# Patient Record
Sex: Male | Born: 1940 | Race: White | Hispanic: No | Marital: Married | Smoking: Former smoker
Health system: Southern US, Community
[De-identification: ages and names within clinical notes are randomized; demographics above are authoritative.]

## PROBLEM LIST (undated history)

## (undated) DIAGNOSIS — R079 Chest pain, unspecified: Secondary | ICD-10-CM

## (undated) DIAGNOSIS — I2510 Atherosclerotic heart disease of native coronary artery without angina pectoris: Secondary | ICD-10-CM

## (undated) DIAGNOSIS — R0989 Other specified symptoms and signs involving the circulatory and respiratory systems: Secondary | ICD-10-CM

## (undated) DIAGNOSIS — I219 Acute myocardial infarction, unspecified: Secondary | ICD-10-CM

## (undated) DIAGNOSIS — E65 Localized adiposity: Secondary | ICD-10-CM

## (undated) DIAGNOSIS — E559 Vitamin D deficiency, unspecified: Secondary | ICD-10-CM

## (undated) DIAGNOSIS — E785 Hyperlipidemia, unspecified: Secondary | ICD-10-CM

## (undated) DIAGNOSIS — H9190 Unspecified hearing loss, unspecified ear: Secondary | ICD-10-CM

## (undated) DIAGNOSIS — R0600 Dyspnea, unspecified: Secondary | ICD-10-CM

## (undated) DIAGNOSIS — F1010 Alcohol abuse, uncomplicated: Secondary | ICD-10-CM

## (undated) DIAGNOSIS — I6529 Occlusion and stenosis of unspecified carotid artery: Secondary | ICD-10-CM

## (undated) DIAGNOSIS — H61002 Unspecified perichondritis of left external ear: Secondary | ICD-10-CM

## (undated) DIAGNOSIS — I4891 Unspecified atrial fibrillation: Secondary | ICD-10-CM

## (undated) DIAGNOSIS — E079 Disorder of thyroid, unspecified: Secondary | ICD-10-CM

## (undated) DIAGNOSIS — K219 Gastro-esophageal reflux disease without esophagitis: Secondary | ICD-10-CM

## (undated) DIAGNOSIS — M1990 Unspecified osteoarthritis, unspecified site: Secondary | ICD-10-CM

## (undated) DIAGNOSIS — I10 Essential (primary) hypertension: Secondary | ICD-10-CM

## (undated) HISTORY — DX: Occlusion and stenosis of unspecified carotid artery: I65.29

## (undated) HISTORY — DX: Alcohol abuse, uncomplicated: F10.10

## (undated) HISTORY — DX: Unspecified perichondritis of left external ear: H61.002

## (undated) HISTORY — DX: Disorder of thyroid, unspecified: E07.9

## (undated) HISTORY — DX: Localized adiposity: E65

## (undated) HISTORY — DX: Vitamin D deficiency, unspecified: E55.9

## (undated) HISTORY — DX: Hyperlipidemia, unspecified: E78.5

## (undated) HISTORY — DX: Essential (primary) hypertension: I10

## (undated) HISTORY — DX: Chest pain, unspecified: R07.9

## (undated) HISTORY — DX: Other specified symptoms and signs involving the circulatory and respiratory systems: R09.89

## (undated) HISTORY — PX: CORONARY ARTERY BYPASS GRAFT: SHX141

---

## 1989-11-13 DIAGNOSIS — I219 Acute myocardial infarction, unspecified: Secondary | ICD-10-CM

## 1989-11-13 HISTORY — DX: Acute myocardial infarction, unspecified: I21.9

## 2002-11-13 HISTORY — PX: CAROTID STENT: SHX1301

## 2004-03-01 ENCOUNTER — Ambulatory Visit (HOSPITAL_COMMUNITY): Admission: RE | Admit: 2004-03-01 | Discharge: 2004-03-02 | Payer: Self-pay | Admitting: Cardiovascular Disease

## 2014-04-29 DIAGNOSIS — N138 Other obstructive and reflux uropathy: Secondary | ICD-10-CM | POA: Insufficient documentation

## 2014-04-29 DIAGNOSIS — R972 Elevated prostate specific antigen [PSA]: Secondary | ICD-10-CM | POA: Insufficient documentation

## 2014-04-29 DIAGNOSIS — N401 Enlarged prostate with lower urinary tract symptoms: Secondary | ICD-10-CM | POA: Insufficient documentation

## 2014-07-01 DIAGNOSIS — H9190 Unspecified hearing loss, unspecified ear: Secondary | ICD-10-CM | POA: Insufficient documentation

## 2014-07-01 DIAGNOSIS — J309 Allergic rhinitis, unspecified: Secondary | ICD-10-CM | POA: Insufficient documentation

## 2014-07-01 DIAGNOSIS — H9313 Tinnitus, bilateral: Secondary | ICD-10-CM | POA: Insufficient documentation

## 2014-07-01 DIAGNOSIS — I219 Acute myocardial infarction, unspecified: Secondary | ICD-10-CM | POA: Insufficient documentation

## 2014-07-01 DIAGNOSIS — K219 Gastro-esophageal reflux disease without esophagitis: Secondary | ICD-10-CM | POA: Insufficient documentation

## 2014-07-01 DIAGNOSIS — N529 Male erectile dysfunction, unspecified: Secondary | ICD-10-CM | POA: Insufficient documentation

## 2014-07-01 DIAGNOSIS — N400 Enlarged prostate without lower urinary tract symptoms: Secondary | ICD-10-CM | POA: Insufficient documentation

## 2014-07-01 DIAGNOSIS — E785 Hyperlipidemia, unspecified: Secondary | ICD-10-CM | POA: Insufficient documentation

## 2014-07-01 DIAGNOSIS — I2510 Atherosclerotic heart disease of native coronary artery without angina pectoris: Secondary | ICD-10-CM | POA: Insufficient documentation

## 2014-07-01 DIAGNOSIS — H903 Sensorineural hearing loss, bilateral: Secondary | ICD-10-CM | POA: Insufficient documentation

## 2014-07-01 DIAGNOSIS — H8110 Benign paroxysmal vertigo, unspecified ear: Secondary | ICD-10-CM | POA: Insufficient documentation

## 2014-07-01 DIAGNOSIS — E7800 Pure hypercholesterolemia, unspecified: Secondary | ICD-10-CM | POA: Insufficient documentation

## 2016-08-18 DIAGNOSIS — Z8619 Personal history of other infectious and parasitic diseases: Secondary | ICD-10-CM | POA: Insufficient documentation

## 2016-08-18 DIAGNOSIS — Z8719 Personal history of other diseases of the digestive system: Secondary | ICD-10-CM | POA: Insufficient documentation

## 2017-04-23 ENCOUNTER — Telehealth: Payer: Self-pay | Admitting: *Deleted

## 2017-04-23 NOTE — Telephone Encounter (Signed)
NOTES SENT TO SCHEDULING.  °

## 2017-04-25 ENCOUNTER — Telehealth: Payer: Self-pay | Admitting: Cardiovascular Disease

## 2017-04-25 NOTE — Telephone Encounter (Signed)
Received records from [facility] for appointment on 05/02/17 with Dr Allyson Sabal.  Records put with Dr Hazle Coca schedule for 04/2017. lp

## 2017-04-30 DIAGNOSIS — K219 Gastro-esophageal reflux disease without esophagitis: Secondary | ICD-10-CM | POA: Insufficient documentation

## 2017-05-02 ENCOUNTER — Ambulatory Visit (INDEPENDENT_AMBULATORY_CARE_PROVIDER_SITE_OTHER): Payer: Medicare Other | Admitting: Cardiovascular Disease

## 2017-05-02 ENCOUNTER — Encounter: Payer: Self-pay | Admitting: Cardiovascular Disease

## 2017-05-02 VITALS — BP 140/72 | HR 58 | Ht 67.0 in | Wt 214.2 lb

## 2017-05-02 DIAGNOSIS — R079 Chest pain, unspecified: Secondary | ICD-10-CM | POA: Diagnosis not present

## 2017-05-02 DIAGNOSIS — I10 Essential (primary) hypertension: Secondary | ICD-10-CM

## 2017-05-02 DIAGNOSIS — E7800 Pure hypercholesterolemia, unspecified: Secondary | ICD-10-CM | POA: Diagnosis not present

## 2017-05-02 NOTE — Assessment & Plan Note (Signed)
History of coronary artery disease status post one-vessel coronary artery bypass grafting with a RIMA to the RCA by Dr. Arvilla Market in 1991 at [facility]. I placed a 2.75 mm x 16 mm long Taxus drug eluding stent to his proximal LAD 02/20/04. He's had a recent 2-D echo was normal and a Myoview stress test performed 02/03/16 was normal. He's had progressive substernal chest pain or dyspnea was referred by his primary care physician for diagnostic coronary angiography.The patient understands that risks included but are not limited to stroke (1 in 1000), death (1 in 1000), kidney failure [usually temporary] (1 in 500), bleeding (1 in 200), allergic reaction [possibly serious] (1 in 200). The patient understands and agrees to proceed

## 2017-05-02 NOTE — Patient Instructions (Signed)
   [facility] CARDIOVASCULAR DIVISION [facility] [address] [place] [address] Dept: [phone] Loc: [phone]  Kevin Gonzales  05/02/2017  You are scheduled for a Cardiac Catheterization on Thursday, June 28 with Dr. Nanetta Batty.  1. Please arrive at the [facility] (Main Entrance A) at [facility]: [address] [place], [place] [address] at 9:30 AM (two hours before your procedure to ensure your preparation). Free valet parking service is available.   Special note: Every effort is made to have your procedure done on time. Please understand that emergencies sometimes delay scheduled procedures.  2. Diet: Do not eat or drink anything after midnight prior to your procedure except sips of water to take medications.  3. Labs: You will need to have blood drawn on Wednesday, June 20 in out office. 4. Medication instructions in preparation for your procedure:  On the morning of your procedure, take your Aspirin and any morning medicines NOT listed above.  You may use sips of water.  5. Plan for one night stay--bring personal belongings. 6. Bring a current list of your medications and current insurance cards. 7. You MUST have a responsible person to drive you home. 8. Someone MUST be with you the first 24 hours after you arrive home or your discharge will be delayed. 9. Please wear clothes that are easy to get on and off and wear slip-on shoes.  Thank you for allowing [place] to care for you!   -- [facility]

## 2017-05-02 NOTE — Assessment & Plan Note (Signed)
History of essential hypertension blood pressure measured today at 140/72. He is on losartan. Continue current meds at current dosing.

## 2017-05-02 NOTE — Assessment & Plan Note (Signed)
History of hyperlipidemia not on statin therapy. 

## 2017-05-02 NOTE — Progress Notes (Signed)
05/02/2017 Kevin Gonzales   03-25-1941  [id number]  Primary Physician No primary care provider on file. Primary Cardiologist: Kevin Gess MD Kevin Gonzales  HPI:  Kevin Gonzales is a 76 year old mildly overweight married Caucasian male father of 2, grandfather one grandchild referred by Kevin Gonzales for cardiovascular evaluation and coronary angiography because of ongoing chest pain. He has a history of treated hypertension, hyperlipidemia and remote tobacco abuse having quit in 1991. He had coronary artery bypass grafting times one in 1991 with a RIMA to his RCA by Kevin Gonzales at [facility]. I placed a 2.75 mm x 16 mm long Taxus drug-eluting stent in his proximal LAD 02/20/04. Over the last one to 2 years she's had progressive shortness of breath and chest pain. Did have an normal 2-D echo 11/17/16 and a nonischemic Myoview 02/03/16.   Current Outpatient Prescriptions  Medication Sig Dispense Refill  . aspirin EC 81 MG tablet Take 81 mg by mouth daily.    . beclomethasone (QVAR) 80 MCG/ACT inhaler Inhale 1 puff into the lungs 2 (two) times daily.    . finasteride (PROSCAR) 5 MG tablet Take 5 mg by mouth daily.    Marland Kitchen losartan (COZAAR) 50 MG tablet Take 50 mg by mouth daily.    . meclizine (ANTIVERT) 25 MG tablet Take 25 mg by mouth 3 (three) times daily as needed for dizziness.    . nitroGLYCERIN (NITROSTAT) 0.4 MG SL tablet Place 0.4 mg under the tongue every 5 (five) minutes as needed for chest pain.    . Omega-3 Fatty Acids (FISH OIL PO) Take 3 capsules by mouth daily.    Marland Kitchen omeprazole (PRILOSEC) 20 MG capsule Take 20 mg by mouth daily.    . Vitamin D, Ergocalciferol, (DRISDOL) 50000 units CAPS capsule Take 50,000 Units by mouth every 7 (seven) days.     No current facility-administered medications for this visit.     No Known Allergies  Social History   Social History  . Marital status: Married    Spouse name: N/A  . Number of children: N/A  .  Years of education: N/A   Occupational History  . Not on file.   Social History Main Topics  . Smoking status: Not on file  . Smokeless tobacco: Not on file  . Alcohol use Not on file  . Drug use: No  . Sexual activity: Not on file   Other Topics Concern  . Not on file   Social History Narrative  . No narrative on file     Review of Systems: General: negative for chills, fever, night sweats or weight changes.  Cardiovascular: negative for chest pain, dyspnea on exertion, edema, orthopnea, palpitations, paroxysmal nocturnal dyspnea or shortness of breath Dermatological: negative for rash Respiratory: negative for cough or wheezing Urologic: negative for hematuria Abdominal: negative for nausea, vomiting, diarrhea, bright red blood per rectum, melena, or hematemesis Neurologic: negative for visual changes, syncope, or dizziness All other systems reviewed and are otherwise negative except as noted above.    Blood pressure 140/72, pulse (!) 58, height 5\' 7"  (1.702 m), weight 214 lb 3.2 oz (97.2 kg).  General appearance: alert and no distress Neck: no adenopathy, no JVD, supple, symmetrical, trachea midline, thyroid not enlarged, symmetric, no tenderness/mass/nodules and Right carotid bruit Lungs: clear to auscultation bilaterally Heart: regular rate and rhythm, S1, S2 normal, no murmur, click, rub or gallop Extremities: extremities normal, atraumatic, no cyanosis or edema  EKG  sinus bradycardia 58 without ST or T-wave changes. I personally reviewed this EKG  ASSESSMENT AND PLAN:   Coronary artery disease History of coronary artery disease status post one-vessel coronary artery bypass grafting with a RIMA to the RCA by Kevin Gonzales in 1991 at [facility]. I placed a 2.75 mm x 16 mm long Taxus drug eluding stent to his proximal LAD 02/20/04. He's had a recent 2-D echo was normal and a Myoview stress test performed 02/03/16 was normal. He's had progressive  substernal chest pain or dyspnea was referred by his primary care physician for diagnostic coronary angiography.The patient understands that risks included but are not limited to stroke (1 in 1000), death (1 in 1000), kidney failure [usually temporary] (1 in 500), bleeding (1 in 200), allergic reaction [possibly serious] (1 in 200). The patient understands and agrees to proceed  Hypercholesterolemia History of hyperlipidemia not on statin therapy  Essential hypertension History of essential hypertension blood pressure measured today at 140/72. He is on losartan. Continue current meds at current dosing.      Kevin Gess MD FACP,FACC,FAHA, [facility] 05/02/2017 2:30 PM

## 2017-05-03 LAB — CBC WITH DIFFERENTIAL/PLATELET
BASOS ABS: 0.1 10*3/uL (ref 0.0–0.2)
Basos: 1 %
EOS (ABSOLUTE): 0.2 10*3/uL (ref 0.0–0.4)
Eos: 3 %
HEMOGLOBIN: 14.4 g/dL (ref 13.0–17.7)
Hematocrit: 42.5 % (ref 37.5–51.0)
IMMATURE GRANS (ABS): 0 10*3/uL (ref 0.0–0.1)
Immature Granulocytes: 0 %
LYMPHS: 25 %
Lymphocytes Absolute: 1.7 10*3/uL (ref 0.7–3.1)
MCH: 31 pg (ref 26.6–33.0)
MCHC: 33.9 g/dL (ref 31.5–35.7)
MCV: 92 fL (ref 79–97)
MONOCYTES: 7 %
Monocytes Absolute: 0.5 10*3/uL (ref 0.1–0.9)
NEUTROS ABS: 4.2 10*3/uL (ref 1.4–7.0)
Neutrophils: 64 %
Platelets: 246 10*3/uL (ref 150–379)
RBC: 4.64 x10E6/uL (ref 4.14–5.80)
RDW: 13 % (ref 12.3–15.4)
WBC: 6.6 10*3/uL (ref 3.4–10.8)

## 2017-05-03 LAB — APTT: APTT: 25 s (ref 24–33)

## 2017-05-03 LAB — BASIC METABOLIC PANEL
BUN/Creatinine Ratio: 20 (ref 10–24)
BUN: 16 mg/dL (ref 8–27)
CALCIUM: 9.3 mg/dL (ref 8.6–10.2)
CHLORIDE: 101 mmol/L (ref 96–106)
CO2: 25 mmol/L (ref 20–29)
Creatinine, Ser: 0.79 mg/dL (ref 0.76–1.27)
GFR, EST AFRICAN AMERICAN: 102 mL/min/{1.73_m2} (ref >59)
GFR, EST NON AFRICAN AMERICAN: 88 mL/min/{1.73_m2} (ref >59)
Glucose: 102 mg/dL — ABNORMAL HIGH (ref 65–99)
Potassium: 4.3 mmol/L (ref 3.5–5.2)
Sodium: 140 mmol/L (ref 134–144)

## 2017-05-03 LAB — PROTIME-INR
INR: 0.9 (ref 0.8–1.2)
PROTHROMBIN TIME: 10.1 s (ref 9.1–12.0)

## 2017-05-03 LAB — TSH: TSH: 2.86 u[IU]/mL (ref 0.450–4.500)

## 2017-05-08 ENCOUNTER — Telehealth: Payer: Self-pay

## 2017-05-08 NOTE — Telephone Encounter (Signed)
Patient contacted pre-catheterization at [facility] scheduled for: 05/10/2017 @ 1330 Verified arrival time and place: NT at 1130-proceed to admitting Confirmed AM meds to be taken pre-cath with sip of water:  Notified Pt to take baby ASA prior to arrival.  Pt states he will take all his meds.  Notified Pt that was fine. Confirmed patient has responsible person to drive home post procedure and observe patient for 24 hours:  Pt states yes Addl concerns:  None noted

## 2017-05-09 ENCOUNTER — Other Ambulatory Visit: Payer: Self-pay | Admitting: Cardiovascular Disease

## 2017-05-09 DIAGNOSIS — R079 Chest pain, unspecified: Secondary | ICD-10-CM

## 2017-05-10 ENCOUNTER — Ambulatory Visit (HOSPITAL_COMMUNITY)
Admission: RE | Admit: 2017-05-10 | Discharge: 2017-05-10 | Disposition: A | Discharge status: Home / Self Care | Payer: Medicare Other | Source: Ambulatory Visit | Attending: Cardiovascular Disease | Admitting: Cardiovascular Disease

## 2017-05-10 ENCOUNTER — Encounter (HOSPITAL_COMMUNITY)
Admission: RE | Disposition: A | Discharge status: Home / Self Care | Payer: Self-pay | Source: Ambulatory Visit | Attending: Cardiovascular Disease

## 2017-05-10 DIAGNOSIS — Z955 Presence of coronary angioplasty implant and graft: Secondary | ICD-10-CM | POA: Insufficient documentation

## 2017-05-10 DIAGNOSIS — Z7982 Long term (current) use of aspirin: Secondary | ICD-10-CM | POA: Insufficient documentation

## 2017-05-10 DIAGNOSIS — I2583 Coronary atherosclerosis due to lipid rich plaque: Secondary | ICD-10-CM | POA: Insufficient documentation

## 2017-05-10 DIAGNOSIS — I2510 Atherosclerotic heart disease of native coronary artery without angina pectoris: Secondary | ICD-10-CM | POA: Diagnosis present

## 2017-05-10 DIAGNOSIS — I25118 Atherosclerotic heart disease of native coronary artery with other forms of angina pectoris: Secondary | ICD-10-CM | POA: Insufficient documentation

## 2017-05-10 DIAGNOSIS — I2582 Chronic total occlusion of coronary artery: Secondary | ICD-10-CM | POA: Insufficient documentation

## 2017-05-10 DIAGNOSIS — Z87891 Personal history of nicotine dependence: Secondary | ICD-10-CM | POA: Insufficient documentation

## 2017-05-10 DIAGNOSIS — E7800 Pure hypercholesterolemia, unspecified: Secondary | ICD-10-CM | POA: Diagnosis not present

## 2017-05-10 DIAGNOSIS — Z951 Presence of aortocoronary bypass graft: Secondary | ICD-10-CM | POA: Insufficient documentation

## 2017-05-10 DIAGNOSIS — I10 Essential (primary) hypertension: Secondary | ICD-10-CM | POA: Insufficient documentation

## 2017-05-10 DIAGNOSIS — R079 Chest pain, unspecified: Secondary | ICD-10-CM

## 2017-05-10 HISTORY — PX: LEFT HEART CATH AND CORS/GRAFTS ANGIOGRAPHY: CATH118250

## 2017-05-10 SURGERY — LEFT HEART CATH AND CORS/GRAFTS ANGIOGRAPHY
Anesthesia: LOCAL

## 2017-05-10 MED ORDER — ONDANSETRON HCL 4 MG/2ML IJ SOLN: 4.0000 mg | Freq: Four times a day (QID) | INTRAMUSCULAR | Status: DC | PRN

## 2017-05-10 MED ORDER — LIDOCAINE HCL (PF) 1 % IJ SOLN
INTRAMUSCULAR | Status: DC | PRN
  Administered 2017-05-10: 15 mL via INTRADERMAL

## 2017-05-10 MED ORDER — SODIUM CHLORIDE 0.9 % IV SOLN: 250.0000 mL | INTRAVENOUS | Status: DC | PRN

## 2017-05-10 MED ORDER — SODIUM CHLORIDE 0.9 % IV SOLN
250.0000 mL | INTRAVENOUS | Status: DC | PRN
Start: 2017-05-10 — End: 2017-05-10

## 2017-05-10 MED ORDER — ASPIRIN 81 MG PO CHEW: 81.0000 mg | CHEWABLE_TABLET | ORAL | Status: DC

## 2017-05-10 MED ORDER — SODIUM CHLORIDE 0.9% FLUSH: 3.0000 mL | INTRAVENOUS | Status: DC | PRN

## 2017-05-10 MED ORDER — SODIUM CHLORIDE 0.9% FLUSH: 3.0000 mL | Freq: Two times a day (BID) | INTRAVENOUS | Status: DC

## 2017-05-10 MED ORDER — MIDAZOLAM HCL 2 MG/2ML IJ SOLN
INTRAMUSCULAR | Status: DC | PRN
  Administered 2017-05-10: 1 mg via INTRAVENOUS

## 2017-05-10 MED ORDER — IOPAMIDOL (ISOVUE-370) INJECTION 76%
INTRAVENOUS | Status: AC
  Filled 2017-05-10: qty 50

## 2017-05-10 MED ORDER — ACETAMINOPHEN 325 MG PO TABS: 650.0000 mg | ORAL_TABLET | ORAL | Status: DC | PRN

## 2017-05-10 MED ORDER — FENTANYL CITRATE (PF) 100 MCG/2ML IJ SOLN
INTRAMUSCULAR | Status: DC | PRN
  Administered 2017-05-10: 25 ug via INTRAVENOUS

## 2017-05-10 MED ORDER — SODIUM CHLORIDE 0.9 % WEIGHT BASED INFUSION: 1.0000 mL/kg/h | INTRAVENOUS | Status: DC

## 2017-05-10 MED ORDER — MIDAZOLAM HCL 2 MG/2ML IJ SOLN
INTRAMUSCULAR | Status: AC
  Filled 2017-05-10: qty 2

## 2017-05-10 MED ORDER — SODIUM CHLORIDE 0.9 % IV SOLN: INTRAVENOUS | Status: AC

## 2017-05-10 MED ORDER — HYDRALAZINE HCL 20 MG/ML IJ SOLN: 10.0000 mg | INTRAMUSCULAR | Status: DC | PRN

## 2017-05-10 MED ORDER — HEPARIN (PORCINE) IN NACL 2-0.9 UNIT/ML-% IJ SOLN
INTRAMUSCULAR | Status: AC | PRN
Start: 2017-05-10 — End: 2017-05-10
  Administered 2017-05-10: 1000 mL

## 2017-05-10 MED ORDER — MORPHINE SULFATE (PF) 4 MG/ML IV SOLN: 2.0000 mg | INTRAVENOUS | Status: DC | PRN

## 2017-05-10 MED ORDER — FENTANYL CITRATE (PF) 100 MCG/2ML IJ SOLN
INTRAMUSCULAR | Status: AC
  Filled 2017-05-10: qty 2

## 2017-05-10 MED ORDER — ASPIRIN 81 MG PO CHEW: 81.0000 mg | CHEWABLE_TABLET | Freq: Every day | ORAL | Status: DC

## 2017-05-10 MED ORDER — LIDOCAINE HCL (PF) 1 % IJ SOLN
INTRAMUSCULAR | Status: AC
  Filled 2017-05-10: qty 30

## 2017-05-10 MED ORDER — IOPAMIDOL (ISOVUE-370) INJECTION 76%
INTRAVENOUS | Status: DC | PRN
  Administered 2017-05-10: 125 mL via INTRAVENOUS

## 2017-05-10 MED ORDER — IOPAMIDOL (ISOVUE-370) INJECTION 76%
INTRAVENOUS | Status: AC
  Filled 2017-05-10: qty 125

## 2017-05-10 MED ORDER — HEPARIN (PORCINE) IN NACL 2-0.9 UNIT/ML-% IJ SOLN
INTRAMUSCULAR | Status: AC
  Filled 2017-05-10: qty 1000

## 2017-05-10 MED ORDER — SODIUM CHLORIDE 0.9 % WEIGHT BASED INFUSION
3.0000 mL/kg/h | INTRAVENOUS | Status: AC
  Administered 2017-05-10: 3 mL/kg/h via INTRAVENOUS

## 2017-05-10 MED ORDER — SODIUM CHLORIDE 0.9 % WEIGHT BASED INFUSION: 3.0000 mL/kg/h | INTRAVENOUS | Status: DC

## 2017-05-10 SURGICAL SUPPLY — 10 items
CATH INFINITI 5FR JL5 (CATHETERS) ×2 IMPLANT
CATH INFINITI 5FR MULTPACK ANG (CATHETERS) ×2 IMPLANT
GUIDEWIRE 3MM J TIP .035 145 (WIRE) ×2 IMPLANT
KIT HEART LEFT (KITS) ×2 IMPLANT
PACK CARDIAC CATHETERIZATION (CUSTOM PROCEDURE TRAY) ×2 IMPLANT
SHEATH PINNACLE 5F 10CM (SHEATH) ×2 IMPLANT
SYR MEDRAD MARK V 150ML (SYRINGE) ×2 IMPLANT
TRANSDUCER W/STOPCOCK (MISCELLANEOUS) ×2 IMPLANT
TUBING CIL FLEX 10 FLL-RA (TUBING) ×2 IMPLANT
WIRE HI TORQ VERSACORE J 260CM (WIRE) ×2 IMPLANT

## 2017-05-10 NOTE — H&P (View-Only) (Signed)
05/02/2017 Kevin Gonzales   03-25-1941  [id number]  Primary Physician No primary care provider on file. Primary Cardiologist: Kevin Gess MD Kevin Gonzales  HPI:  Kevin Gonzales is a 76 year old mildly overweight married Caucasian male father of 2, grandfather one grandchild referred by Kevin Gonzales for cardiovascular evaluation and coronary angiography because of ongoing chest pain. He has a history of treated hypertension, hyperlipidemia and remote tobacco abuse having quit in 1991. He had coronary artery bypass grafting times one in 1991 with a RIMA to his RCA by Dr. Arvilla Gonzales at [facility]. I placed a 2.75 mm x 16 mm long Taxus drug-eluting stent in his proximal LAD 02/20/04. Over the last one to 2 years she's had progressive shortness of breath and chest pain. Did have an normal 2-D echo 11/17/16 and a nonischemic Myoview 02/03/16.   Current Outpatient Prescriptions  Medication Sig Dispense Refill  . aspirin EC 81 MG tablet Take 81 mg by mouth daily.    . beclomethasone (QVAR) 80 MCG/ACT inhaler Inhale 1 puff into the lungs 2 (two) times daily.    . finasteride (PROSCAR) 5 MG tablet Take 5 mg by mouth daily.    Marland Kitchen losartan (COZAAR) 50 MG tablet Take 50 mg by mouth daily.    . meclizine (ANTIVERT) 25 MG tablet Take 25 mg by mouth 3 (three) times daily as needed for dizziness.    . nitroGLYCERIN (NITROSTAT) 0.4 MG SL tablet Place 0.4 mg under the tongue every 5 (five) minutes as needed for chest pain.    . Omega-3 Fatty Acids (FISH OIL PO) Take 3 capsules by mouth daily.    Marland Kitchen omeprazole (PRILOSEC) 20 MG capsule Take 20 mg by mouth daily.    . Vitamin D, Ergocalciferol, (DRISDOL) 50000 units CAPS capsule Take 50,000 Units by mouth every 7 (seven) days.     No current facility-administered medications for this visit.     No Known Allergies  Social History   Social History  . Marital status: Married    Spouse name: N/A  . Number of children: N/A  .  Years of education: N/A   Occupational History  . Not on file.   Social History Main Topics  . Smoking status: Not on file  . Smokeless tobacco: Not on file  . Alcohol use Not on file  . Drug use: No  . Sexual activity: Not on file   Other Topics Concern  . Not on file   Social History Narrative  . No narrative on file     Review of Systems: General: negative for chills, fever, night sweats or weight changes.  Cardiovascular: negative for chest pain, dyspnea on exertion, edema, orthopnea, palpitations, paroxysmal nocturnal dyspnea or shortness of breath Dermatological: negative for rash Respiratory: negative for cough or wheezing Urologic: negative for hematuria Abdominal: negative for nausea, vomiting, diarrhea, bright red blood per rectum, melena, or hematemesis Neurologic: negative for visual changes, syncope, or dizziness All other systems reviewed and are otherwise negative except as noted above.    Blood pressure 140/72, pulse (!) 58, height 5\' 7"  (1.702 m), weight 214 lb 3.2 oz (97.2 kg).  General appearance: alert and no distress Neck: no adenopathy, no JVD, supple, symmetrical, trachea midline, thyroid not enlarged, symmetric, no tenderness/mass/nodules and Right carotid bruit Lungs: clear to auscultation bilaterally Heart: regular rate and rhythm, S1, S2 normal, no murmur, click, rub or gallop Extremities: extremities normal, atraumatic, no cyanosis or edema  EKG  sinus bradycardia 58 without ST or T-wave changes. I personally reviewed this EKG  ASSESSMENT AND PLAN:   Coronary artery disease History of coronary artery disease status post one-vessel coronary artery bypass grafting with a RIMA to the RCA by Dr. Arvilla Gonzales in 1991 at [facility]. I placed a 2.75 mm x 16 mm long Taxus drug eluding stent to his proximal LAD 02/20/04. He's had a recent 2-D echo was normal and a Myoview stress test performed 02/03/16 was normal. He's had progressive  substernal chest pain or dyspnea was referred by his primary care physician for diagnostic coronary angiography.The patient understands that risks included but are not limited to stroke (1 in 1000), death (1 in 1000), kidney failure [usually temporary] (1 in 500), bleeding (1 in 200), allergic reaction [possibly serious] (1 in 200). The patient understands and agrees to proceed  Hypercholesterolemia History of hyperlipidemia not on statin therapy  Essential hypertension History of essential hypertension blood pressure measured today at 140/72. He is on losartan. Continue current meds at current dosing.      Kevin Gess MD FACP,FACC,FAHA, [facility] 05/02/2017 2:30 PM

## 2017-05-10 NOTE — Discharge Instructions (Signed)
Angiogram, Care After °This sheet gives you information about how to care for yourself after your procedure. Your health care provider may also give you more specific instructions. If you have problems or questions, contact your health care provider. °What can I expect after the procedure? °After the procedure, it is common to have bruising and tenderness at the catheter insertion area. °Follow these instructions at home: °Insertion site care  °· Follow instructions from your health care provider about how to take care of your insertion site. Make sure you: °¨ Wash your hands with soap and water before you change your bandage (dressing). If soap and water are not available, use hand sanitizer. °¨ Change your dressing as told by your health care provider. °¨ Leave stitches (sutures), skin glue, or adhesive strips in place. These skin closures may need to stay in place for 2 weeks or longer. If adhesive strip edges start to loosen and curl up, you may trim the loose edges. Do not remove adhesive strips completely unless your health care provider tells you to do that. °· Do not take baths, swim, or use a hot tub until your health care provider approves. °· You may shower 24-48 hours after the procedure or as told by your health care provider. °¨ Gently wash the site with plain soap and water. °¨ Pat the area dry with a clean towel. °¨ Do not rub the site. This may cause bleeding. °· Do not apply powder or lotion to the site. Keep the site clean and dry. °· Check your insertion site every day for signs of infection. Check for: °¨ Redness, swelling, or pain. °¨ Fluid or blood. °¨ Warmth. °¨ Pus or a bad smell. °Activity  °· Rest as told by your health care provider, usually for 1-2 days. °· Do not lift anything that is heavier than 10 lbs. (4.5 kg) or as told by your health care provider. °· Do not drive for 24 hours if you were given a medicine to help you relax (sedative). °· Do not drive or use heavy machinery while  taking prescription pain medicine. °General instructions  °· Return to your normal activities as told by your health care provider, usually in about a week. Ask your health care provider what activities are safe for you. °· If the catheter site starts bleeding, lie flat and put pressure on the site. If the bleeding does not stop, get help right away. This is a medical emergency. °· Drink enough fluid to keep your urine clear or pale yellow. This helps flush the contrast dye from your body. °· Take over-the-counter and prescription medicines only as told by your health care provider. °· Keep all follow-up visits as told by your health care provider. This is important. °Contact a health care provider if: °· You have a fever or chills. °· You have redness, swelling, or pain around your insertion site. °· You have fluid or blood coming from your insertion site. °· The insertion site feels warm to the touch. °· You have pus or a bad smell coming from your insertion site. °· You have bruising around the insertion site. °· You notice blood collecting in the tissue around the catheter site (hematoma). The hematoma may be painful to the touch. °Get help right away if: °· You have severe pain at the catheter insertion area. °· The catheter insertion area swells very fast. °· The catheter insertion area is bleeding, and the bleeding does not stop when you hold steady pressure on   the area. °· The area near or just beyond the catheter insertion site becomes pale, cool, tingly, or numb. °These symptoms may represent a serious problem that is an emergency. Do not wait to see if the symptoms will go away. Get medical help right away. Call your local emergency services (911 in the [place] Do not drive yourself to the hospital. °Summary °· After the procedure, it is common to have bruising and tenderness at the catheter insertion area. °· After the procedure, it is important to rest and drink plenty of fluids. °· Do not take baths,  swim, or use a hot tub until your health care provider says it is okay to do so. You may shower 24-48 hours after the procedure or as told by your health care provider. °· If the catheter site starts bleeding, lie flat and put pressure on the site. If the bleeding does not stop, get help right away. This is a medical emergency. °This information is not intended to replace advice given to you by your health care provider. Make sure you discuss any questions you have with your health care provider. °Document Released: 05/18/2005 Document Revised: 10/04/2016 Document Reviewed: 10/04/2016 °Elsevier Interactive Patient Education © 2017 [employer] ° °

## 2017-05-10 NOTE — Progress Notes (Signed)
Site area:Right groin a 5 french arterial sheath was removed  Site Prior to Removal:  Level 0  Pressure Applied For 20 MINUTES    Bedrest Beginning at 1515p  Manual:   Yes.    Patient Status During Pull:  stable  Post Pull Groin Site:  Level 0  Post Pull Instructions Given:  Yes.    Post Pull Pulses Present:  Yes.    Dressing Applied:  Yes.    Comments:  VS remain stable at this time

## 2017-05-10 NOTE — Interval H&P Note (Signed)
Cath Lab Visit (complete for each Cath Lab visit)  Clinical Evaluation Leading to the Procedure:   ACS: No.  Non-ACS:    Anginal Classification: CCS II  Anti-ischemic medical therapy: No Therapy  Non-Invasive Test Results: No non-invasive testing performed  Prior CABG: Previous CABG      History and Physical Interval Note:  05/10/2017 1:23 PM  [name]  has presented today for surgery, with the diagnosis of cp  The various methods of treatment have been discussed with the patient and family. After consideration of risks, benefits and other options for treatment, the patient has consented to  Procedure(s): Left Heart Cath and Cors/Grafts Angiography (N/A) as a surgical intervention .  The patient's history has been reviewed, patient examined, no change in status, stable for surgery.  I have reviewed the patient's chart and labs.  Questions were answered to the patient's satisfaction.     Nanetta Batty

## 2017-05-11 ENCOUNTER — Encounter (HOSPITAL_COMMUNITY): Payer: Self-pay | Admitting: Cardiovascular Disease

## 2017-05-22 NOTE — Addendum Note (Signed)
Addended by: Lorain Childes A on: 05/22/2017 11:02 AM   Modules accepted: Orders

## 2017-06-14 DIAGNOSIS — R42 Dizziness and giddiness: Secondary | ICD-10-CM | POA: Insufficient documentation

## 2018-01-30 ENCOUNTER — Other Ambulatory Visit: Payer: Self-pay

## 2018-01-30 DIAGNOSIS — I6523 Occlusion and stenosis of bilateral carotid arteries: Secondary | ICD-10-CM

## 2018-03-19 ENCOUNTER — Encounter: Payer: Self-pay | Admitting: Vascular Surgery

## 2018-03-19 ENCOUNTER — Ambulatory Visit (INDEPENDENT_AMBULATORY_CARE_PROVIDER_SITE_OTHER): Payer: Medicare Other | Admitting: Vascular Surgery

## 2018-03-19 ENCOUNTER — Other Ambulatory Visit: Payer: Self-pay

## 2018-03-19 ENCOUNTER — Ambulatory Visit (HOSPITAL_COMMUNITY)
Admission: RE | Admit: 2018-03-19 | Discharge: 2018-03-19 | Disposition: A | Discharge status: Home / Self Care | Payer: Medicare Other | Source: Ambulatory Visit | Attending: Vascular Surgery | Admitting: Vascular Surgery

## 2018-03-19 ENCOUNTER — Other Ambulatory Visit: Payer: Self-pay | Admitting: *Deleted

## 2018-03-19 DIAGNOSIS — I6523 Occlusion and stenosis of bilateral carotid arteries: Secondary | ICD-10-CM | POA: Insufficient documentation

## 2018-03-19 DIAGNOSIS — I6529 Occlusion and stenosis of unspecified carotid artery: Secondary | ICD-10-CM | POA: Insufficient documentation

## 2018-03-19 NOTE — Progress Notes (Signed)
New Carotid Patient  Requested by:  Ronnald Collum [facility]  [address] [place], [place] [address]  Reason for consultation: bilateral carotid stenosis   History of Present Illness   Kevin Gonzales is a 77 y.o. ([birth year]) male who presents with chief complaint: carotid artery stenosis.  He has been followed by his PCP for carotid artery stenosis with periodic carotid duplex exams over the years.  Last carotid duplex demonstrated 80 to 99% stenosis of right ICA.  Carotid duplex was repeated today in office which confirmed 80 to 99% stenosis of the right ICA and 40 to 59% stenosis of the left ICA.  Patient denies any history of TIA or stroke.  He also denies any strokelike symptoms including slurring speech, changes in vision, or one-sided weakness.  He is a former smoker who quit in 1991.  He is taking a daily aspirin and statin.  Past medical history is significant for CAD with history of CABG and subsequent PCI.  Most recent cardiac catheterization was in June 2018 which did not demonstrate any significant stenosis.  Patient states he walks stairs every day around his house and denies chest pain when doing so.  Also noted on carotid duplex was an occluded right ECA as well as a retrograde right vertebral artery.  Patient however denies drop attacks or right arm fatigue.  Past Medical History:  Diagnosis Date  . Abdominal obesity   . Alcohol abuse   . Carotid bruit   . Chest pain   . Chondrodermatitis nodularis helicis, left   . Hyperlipidemia   . Hypertension   . Thyroid disease   . Vitamin D deficiency     Past Surgical History:  Procedure Laterality Date  . CAROTID STENT  2004  . CORONARY ARTERY BYPASS GRAFT    . LEFT HEART CATH AND CORS/GRAFTS ANGIOGRAPHY N/A 05/10/2017   Procedure: Left Heart Cath and Cors/Grafts Angiography;  Surgeon: Runell Gess, MD;  Location: [facility] INVASIVE CV LAB;  Service: Cardiovascular;  Laterality: N/A;    Social  History   Socioeconomic History  . Marital status: Married    Spouse name: Not on file  . Number of children: Not on file  . Years of education: Not on file  . Highest education level: Not on file  Occupational History  . Not on file  Social Needs  . Financial resource strain: Not on file  . Food insecurity:    Worry: Not on file    Inability: Not on file  . Transportation needs:    Medical: Not on file    Non-medical: Not on file  Tobacco Use  . Smoking status: Former Smoker    Types: Cigarettes    Last attempt to quit: 1991    Years since quitting: 28.3  . Smokeless tobacco: Never Used  Substance and Sexual Activity  . Alcohol use: No  . Drug use: No  . Sexual activity: Never  Lifestyle  . Physical activity:    Days per week: Not on file    Minutes per session: Not on file  . Stress: Not on file  Relationships  . Social connections:    Talks on phone: Not on file    Gets together: Not on file    Attends religious service: Not on file    Active member of club or organization: Not on file    Attends meetings of clubs or organizations: Not on file    Relationship status: Not  on file  . Intimate partner violence:    Fear of current or ex partner: Not on file    Emotionally abused: Not on file    Physically abused: Not on file    Forced sexual activity: Not on file  Other Topics Concern  . Not on file  Social History Narrative  . Not on file    Family History  Problem Relation Age of Onset  . Clotting disorder Father   . Clotting disorder Sister     Current Outpatient Medications  Medication Sig Dispense Refill  . aspirin EC 81 MG tablet Take 81 mg by mouth daily.    Marland Kitchen atenolol (TENORMIN) 25 MG tablet Take 25 mg by mouth daily.    . beclomethasone (QVAR) 80 MCG/ACT inhaler Inhale 1 puff into the lungs 2 (two) times daily.    . finasteride (PROSCAR) 5 MG tablet Take 5 mg by mouth daily.    Marland Kitchen losartan (COZAAR) 50 MG tablet Take 50 mg by mouth daily.    Marland Kitchen  losartan (COZAAR) 50 MG tablet Take by mouth.    . meclizine (ANTIVERT) 25 MG tablet Take 25 mg by mouth 3 (three) times daily as needed for dizziness.    . nitroGLYCERIN (NITROSTAT) 0.4 MG SL tablet Place 0.4 mg under the tongue every 5 (five) minutes as needed for chest pain.    . Omega-3 Fatty Acids (FISH OIL PO) Take 1 capsule by mouth daily.     Marland Kitchen omeprazole (PRILOSEC) 20 MG capsule Take 20 mg by mouth daily.    . Pitavastatin Calcium (LIVALO) 2 MG TABS Take by mouth.    . Vitamin D, Ergocalciferol, (DRISDOL) 50000 units CAPS capsule Take 50,000 Units by mouth every 7 (seven) days. Sundays    . HYDROcodone-Acetaminophen 2.5-325 MG TABS 7.5 tablets.     No current facility-administered medications for this visit.     Allergies  Allergen Reactions  . No Known Allergies     REVIEW OF SYSTEMS (negative unless checked):   Cardiac:  []  Chest pain or chest pressure? []  Shortness of breath upon activity? []  Shortness of breath when lying flat? []  Irregular heart rhythm?  Vascular:  []  Pain in calf, thigh, or hip brought on by walking? []  Pain in feet at night that wakes you up from your sleep? []  Blood clot in your veins? []  Leg swelling?  Pulmonary:  []  Oxygen at home? []  Productive cough? []  Wheezing?  Neurologic:  []  Sudden weakness in arms or legs? []  Sudden numbness in arms or legs? []  Sudden onset of difficult speaking or slurred speech? []  Temporary loss of vision in one eye? []  Problems with dizziness?  Gastrointestinal:  []  Blood in stool? []  Vomited blood?  Genitourinary:  []  Burning when urinating? []  Blood in urine?  Psychiatric:  []  Major depression  Hematologic:  []  Bleeding problems? []  Problems with blood clotting?  Dermatologic:  []  Rashes or ulcers?  Constitutional:  []  Fever or chills?  Ear/Nose/Throat:  []  Change in hearing? []  Nose bleeds? []  Sore throat?  Musculoskeletal:  []  Back pain? []  Joint pain? []  Muscle pain?   For  VQI Use Only   PRE-ADM LIVING Home  AMB STATUS Ambulatory  CAD Sx None  PRIOR CHF None  STRESS TEST No    Physical Examination     Vitals:   03/19/18 1403 03/19/18 1409 03/19/18 1410  BP: (!) 166/71 (!) 182/71 (!) 181/81  Pulse: (!) 53 (!) 53   Resp: 18  Temp: (!) 97.1 F (36.2 C)    TempSrc: Oral    SpO2: 98%    Weight: 217 lb (98.4 kg)    Height: 5\' 8"  (1.727 m)     Body mass index is 32.99 kg/m.  General Alert, O x 3, Obese, NAD  Head [place]/AT,    Neck Supple, mid-line trachea,    Pulmonary Sym exp, good B air movt, CTA B  Cardiac RRR, Nl S1, S2,   Vascular Vessel Right Left  Radial Palpable Palpable  Brachial Palpable Palpable  Carotid Palpable, soft bruit Palpable, soft bruit  Aorta Not palpable N/A  Femoral Not examined Not examined  Popliteal Not examined Not examined  PT Not palpable Not palpable  DP Palpable Palpable    Gastro- intestinal soft, non-distended, non-tender to palpation,   Musculo- skeletal M/S 5/5 throughout  , Extremities without ischemic changes  ,   Neurologic Cranial nerves 2-12 intact   Psychiatric Judgement intact, Mood & affect appropriate for pt's clinical situation  Dermatologic See M/S exam for extremity exam, No rashes otherwise noted  Lymphatic  Palpable lymph nodes: None     Non-invasive Vascular Imaging   B Carotid Duplex (03/19/18):  Marland Kitchen R ICA stenosis:  80-99% . R VA: patent and retrograde . L ICA stenosis:  40-59% . L VA:  patent and antegrade   Medical Decision Making   Kevin Gonzales is a 77 y.o. male who presents with: Critical asymptomatic 80 to 99% stenosis of right ICA by carotid duplex   Based on the degree of stenosis, he is indicated for R CEA  We will reach out to patient's cardiologist prior to surgery to see if any further pre-operative workup is indicated  Patient was asked to continue his aspirin and statin up to and through time of surgery  Dr. Arbie Cookey was involved in the evaluation and  management plan of this patient   Emilie Rutter, PA-C [facility] Office: [phone]  03/19/2018, 2:53 PM  I have examined the patient, reviewed and agree with above.  Very nice gentleman with critical stenosis right internal carotid artery.  I recommended endarterectomy for reduction of stroke risk.  I explained the procedure in detail and also the 1 to 1-1/2% risk of stroke with surgery.  Also the rare incidence of cranial nerve injury.  We will schedule surgery after we have assured cardiac clearance.  He did have a formal cardiac catheterization in June 2018 with no evidence of critical stenosis.  Suspect that he will not need any further work-up but will confirm this.  [name], MD 03/19/2018 4:05 PM

## 2018-03-19 NOTE — Progress Notes (Signed)
Vitals:   03/19/18 1403 03/19/18 1409  BP: (!) 166/71 (!) 182/71  Pulse: (!) 53 (!) 53  Resp: 18   Temp: (!) 97.1 F (36.2 C)   TempSrc: Oral   SpO2: 98%   Weight: 217 lb (98.4 kg)   Height: 5\' 8"  (1.727 m)

## 2018-03-19 NOTE — H&P (View-Only) (Signed)
New Carotid Patient  Requested by:  Ronnald Collum [facility]  [address] [place], [place] [address]  Reason for consultation: bilateral carotid stenosis   History of Present Illness   Kevin Gonzales is a 77 y.o. ([birth year]) male who presents with chief complaint: carotid artery stenosis.  He has been followed by his PCP for carotid artery stenosis with periodic carotid duplex exams over the years.  Last carotid duplex demonstrated 80 to 99% stenosis of right ICA.  Carotid duplex was repeated today in office which confirmed 80 to 99% stenosis of the right ICA and 40 to 59% stenosis of the left ICA.  Patient denies any history of TIA or stroke.  He also denies any strokelike symptoms including slurring speech, changes in vision, or one-sided weakness.  He is a former smoker who quit in 1991.  He is taking a daily aspirin and statin.  Past medical history is significant for CAD with history of CABG and subsequent PCI.  Most recent cardiac catheterization was in June 2018 which did not demonstrate any significant stenosis.  Patient states he walks stairs every day around his house and denies chest pain when doing so.  Also noted on carotid duplex was an occluded right ECA as well as a retrograde right vertebral artery.  Patient however denies drop attacks or right arm fatigue.  Past Medical History:  Diagnosis Date  . Abdominal obesity   . Alcohol abuse   . Carotid bruit   . Chest pain   . Chondrodermatitis nodularis helicis, left   . Hyperlipidemia   . Hypertension   . Thyroid disease   . Vitamin D deficiency     Past Surgical History:  Procedure Laterality Date  . CAROTID STENT  2004  . CORONARY ARTERY BYPASS GRAFT    . LEFT HEART CATH AND CORS/GRAFTS ANGIOGRAPHY N/A 05/10/2017   Procedure: Left Heart Cath and Cors/Grafts Angiography;  Surgeon: Runell Gess, MD;  Location: [facility] INVASIVE CV LAB;  Service: Cardiovascular;  Laterality: N/A;    Social  History   Socioeconomic History  . Marital status: Married    Spouse name: Not on file  . Number of children: Not on file  . Years of education: Not on file  . Highest education level: Not on file  Occupational History  . Not on file  Social Needs  . Financial resource strain: Not on file  . Food insecurity:    Worry: Not on file    Inability: Not on file  . Transportation needs:    Medical: Not on file    Non-medical: Not on file  Tobacco Use  . Smoking status: Former Smoker    Types: Cigarettes    Last attempt to quit: 1991    Years since quitting: 28.3  . Smokeless tobacco: Never Used  Substance and Sexual Activity  . Alcohol use: No  . Drug use: No  . Sexual activity: Never  Lifestyle  . Physical activity:    Days per week: Not on file    Minutes per session: Not on file  . Stress: Not on file  Relationships  . Social connections:    Talks on phone: Not on file    Gets together: Not on file    Attends religious service: Not on file    Active member of club or organization: Not on file    Attends meetings of clubs or organizations: Not on file    Relationship status: Not  on file  . Intimate partner violence:    Fear of current or ex partner: Not on file    Emotionally abused: Not on file    Physically abused: Not on file    Forced sexual activity: Not on file  Other Topics Concern  . Not on file  Social History Narrative  . Not on file    Family History  Problem Relation Age of Onset  . Clotting disorder Father   . Clotting disorder Sister     Current Outpatient Medications  Medication Sig Dispense Refill  . aspirin EC 81 MG tablet Take 81 mg by mouth daily.    Marland Kitchen atenolol (TENORMIN) 25 MG tablet Take 25 mg by mouth daily.    . beclomethasone (QVAR) 80 MCG/ACT inhaler Inhale 1 puff into the lungs 2 (two) times daily.    . finasteride (PROSCAR) 5 MG tablet Take 5 mg by mouth daily.    Marland Kitchen losartan (COZAAR) 50 MG tablet Take 50 mg by mouth daily.    Marland Kitchen  losartan (COZAAR) 50 MG tablet Take by mouth.    . meclizine (ANTIVERT) 25 MG tablet Take 25 mg by mouth 3 (three) times daily as needed for dizziness.    . nitroGLYCERIN (NITROSTAT) 0.4 MG SL tablet Place 0.4 mg under the tongue every 5 (five) minutes as needed for chest pain.    . Omega-3 Fatty Acids (FISH OIL PO) Take 1 capsule by mouth daily.     Marland Kitchen omeprazole (PRILOSEC) 20 MG capsule Take 20 mg by mouth daily.    . Pitavastatin Calcium (LIVALO) 2 MG TABS Take by mouth.    . Vitamin D, Ergocalciferol, (DRISDOL) 50000 units CAPS capsule Take 50,000 Units by mouth every 7 (seven) days. Sundays    . HYDROcodone-Acetaminophen 2.5-325 MG TABS 7.5 tablets.     No current facility-administered medications for this visit.     Allergies  Allergen Reactions  . No Known Allergies     REVIEW OF SYSTEMS (negative unless checked):   Cardiac:  []  Chest pain or chest pressure? []  Shortness of breath upon activity? []  Shortness of breath when lying flat? []  Irregular heart rhythm?  Vascular:  []  Pain in calf, thigh, or hip brought on by walking? []  Pain in feet at night that wakes you up from your sleep? []  Blood clot in your veins? []  Leg swelling?  Pulmonary:  []  Oxygen at home? []  Productive cough? []  Wheezing?  Neurologic:  []  Sudden weakness in arms or legs? []  Sudden numbness in arms or legs? []  Sudden onset of difficult speaking or slurred speech? []  Temporary loss of vision in one eye? []  Problems with dizziness?  Gastrointestinal:  []  Blood in stool? []  Vomited blood?  Genitourinary:  []  Burning when urinating? []  Blood in urine?  Psychiatric:  []  Major depression  Hematologic:  []  Bleeding problems? []  Problems with blood clotting?  Dermatologic:  []  Rashes or ulcers?  Constitutional:  []  Fever or chills?  Ear/Nose/Throat:  []  Change in hearing? []  Nose bleeds? []  Sore throat?  Musculoskeletal:  []  Back pain? []  Joint pain? []  Muscle pain?   For  VQI Use Only   PRE-ADM LIVING Home  AMB STATUS Ambulatory  CAD Sx None  PRIOR CHF None  STRESS TEST No    Physical Examination     Vitals:   03/19/18 1403 03/19/18 1409 03/19/18 1410  BP: (!) 166/71 (!) 182/71 (!) 181/81  Pulse: (!) 53 (!) 53   Resp: 18  Temp: (!) 97.1 F (36.2 C)    TempSrc: Oral    SpO2: 98%    Weight: 217 lb (98.4 kg)    Height: 5\' 8"  (1.727 m)     Body mass index is 32.99 kg/m.  General Alert, O x 3, Obese, NAD  Head [place]/AT,    Neck Supple, mid-line trachea,    Pulmonary Sym exp, good B air movt, CTA B  Cardiac RRR, Nl S1, S2,   Vascular Vessel Right Left  Radial Palpable Palpable  Brachial Palpable Palpable  Carotid Palpable, soft bruit Palpable, soft bruit  Aorta Not palpable N/A  Femoral Not examined Not examined  Popliteal Not examined Not examined  PT Not palpable Not palpable  DP Palpable Palpable    Gastro- intestinal soft, non-distended, non-tender to palpation,   Musculo- skeletal M/S 5/5 throughout  , Extremities without ischemic changes  ,   Neurologic Cranial nerves 2-12 intact   Psychiatric Judgement intact, Mood & affect appropriate for pt's clinical situation  Dermatologic See M/S exam for extremity exam, No rashes otherwise noted  Lymphatic  Palpable lymph nodes: None     Non-invasive Vascular Imaging   B Carotid Duplex (03/19/18):  Marland Kitchen R ICA stenosis:  80-99% . R VA: patent and retrograde . L ICA stenosis:  40-59% . L VA:  patent and antegrade   Medical Decision Making   Kevin Gonzales is a 77 y.o. male who presents with: Critical asymptomatic 80 to 99% stenosis of right ICA by carotid duplex   Based on the degree of stenosis, he is indicated for R CEA  We will reach out to patient's cardiologist prior to surgery to see if any further pre-operative workup is indicated  Patient was asked to continue his aspirin and statin up to and through time of surgery  Dr. Arbie Cookey was involved in the evaluation and  management plan of this patient   Emilie Rutter, PA-C [facility] Office: [phone]  03/19/2018, 2:53 PM  I have examined the patient, reviewed and agree with above.  Very nice gentleman with critical stenosis right internal carotid artery.  I recommended endarterectomy for reduction of stroke risk.  I explained the procedure in detail and also the 1 to 1-1/2% risk of stroke with surgery.  Also the rare incidence of cranial nerve injury.  We will schedule surgery after we have assured cardiac clearance.  He did have a formal cardiac catheterization in June 2018 with no evidence of critical stenosis.  Suspect that he will not need any further work-up but will confirm this.  [name], MD 03/19/2018 4:05 PM

## 2018-03-21 ENCOUNTER — Telehealth: Payer: Self-pay | Admitting: *Deleted

## 2018-03-21 NOTE — Telephone Encounter (Signed)
Call to [facility] and spoke with Dr. Shelia Media nurse Larita Fife. Will re-fax now and stated she will get to doctor today.

## 2018-03-21 NOTE — Telephone Encounter (Signed)
Phone call to patient and instructed to be at [facility] admitting department at 5:30 am on 03/29/18 for surgery. NPO past MN night prior and to follow detailed instructions he receives from the pre-admission department about this surgery. He has his pre-op appt. Scheduled. Verbalized understanding.

## 2018-03-26 ENCOUNTER — Encounter (HOSPITAL_COMMUNITY): Payer: Self-pay

## 2018-03-26 ENCOUNTER — Encounter (HOSPITAL_COMMUNITY)
Admission: RE | Admit: 2018-03-26 | Discharge: 2018-03-26 | Disposition: A | Discharge status: Home / Self Care | Payer: Medicare Other | Source: Ambulatory Visit | Attending: Vascular Surgery | Admitting: Vascular Surgery

## 2018-03-26 ENCOUNTER — Other Ambulatory Visit: Payer: Self-pay

## 2018-03-26 HISTORY — DX: Unspecified osteoarthritis, unspecified site: M19.90

## 2018-03-26 HISTORY — DX: Gastro-esophageal reflux disease without esophagitis: K21.9

## 2018-03-26 HISTORY — DX: Acute myocardial infarction, unspecified: I21.9

## 2018-03-26 HISTORY — DX: Atherosclerotic heart disease of native coronary artery without angina pectoris: I25.10

## 2018-03-26 LAB — CBC
HEMATOCRIT: 42.3 % (ref 39.0–52.0)
Hemoglobin: 13.8 g/dL (ref 13.0–17.0)
MCH: 30.7 pg (ref 26.0–34.0)
MCHC: 32.6 g/dL (ref 30.0–36.0)
MCV: 94.2 fL (ref 78.0–100.0)
PLATELETS: 217 10*3/uL (ref 150–400)
RBC: 4.49 MIL/uL (ref 4.22–5.81)
RDW: 11.9 % (ref 11.5–15.5)
WBC: 7.2 10*3/uL (ref 4.0–10.5)

## 2018-03-26 LAB — COMPREHENSIVE METABOLIC PANEL
ALT: 14 U/L — ABNORMAL LOW (ref 17–63)
ANION GAP: 6 (ref 5–15)
AST: 19 U/L (ref 15–41)
Albumin: 3.7 g/dL (ref 3.5–5.0)
Alkaline Phosphatase: 43 U/L (ref 38–126)
BILIRUBIN TOTAL: 0.9 mg/dL (ref 0.3–1.2)
BUN: 17 mg/dL (ref 6–20)
CHLORIDE: 105 mmol/L (ref 101–111)
CO2: 28 mmol/L (ref 22–32)
Calcium: 8.9 mg/dL (ref 8.9–10.3)
Creatinine, Ser: 0.94 mg/dL (ref 0.61–1.24)
Glucose, Bld: 135 mg/dL — ABNORMAL HIGH (ref 65–99)
POTASSIUM: 3.6 mmol/L (ref 3.5–5.1)
Sodium: 139 mmol/L (ref 135–145)
Total Protein: 6.2 g/dL — ABNORMAL LOW (ref 6.5–8.1)

## 2018-03-26 LAB — URINALYSIS, ROUTINE W REFLEX MICROSCOPIC
Bilirubin Urine: NEGATIVE
GLUCOSE, UA: NEGATIVE mg/dL
Hgb urine dipstick: NEGATIVE
Ketones, ur: NEGATIVE mg/dL
LEUKOCYTES UA: NEGATIVE
Nitrite: NEGATIVE
PROTEIN: NEGATIVE mg/dL
SPECIFIC GRAVITY, URINE: 1.02 (ref 1.005–1.030)
pH: 5 (ref 5.0–8.0)

## 2018-03-26 LAB — TYPE AND SCREEN
ABO/RH(D): A POS
Antibody Screen: NEGATIVE

## 2018-03-26 LAB — PROTIME-INR
INR: 1.07
PROTHROMBIN TIME: 13.8 s (ref 11.4–15.2)

## 2018-03-26 LAB — SURGICAL PCR SCREEN
MRSA, PCR: NEGATIVE
Staphylococcus aureus: POSITIVE — AB

## 2018-03-26 LAB — APTT: aPTT: 28 seconds (ref 24–36)

## 2018-03-26 LAB — ABO/RH: ABO/RH(D): A POS

## 2018-03-26 NOTE — Pre-Procedure Instructions (Signed)
HADEN SUDER  03/26/2018      [facility] - [place] - [address] [place] [address] Phone: [phone] Fax: [phone]  [facility] - [place], [place] - [address] [place] [address] Phone: [phone] Fax: [phone]    Your procedure is scheduled on May 17  Report to [facility] Admitting at 0530 A.M.  Call this number if you have problems the morning of surgery:  [phone]   Remember:  Do not eat food or drink liquids after midnight.  Continue all medications as directed by your physician except follow these medication instructions before surgery below   Take these medicines the morning of surgery with A SIP OF WATER  atenolol (TENORMIN) if you take this at night make sure you take the night before surgery beclomethasone (QVAR) finasteride (PROSCAR) HYDROcodone-acetaminophen (NORCO)  meclizine (ANTIVERT)  omeprazole (PRILOSEC) nitroGLYCERIN (NITROSTAT) if needed for chest pain  7 days prior to surgery STOP taking any Aspirin(unless otherwise instructed by your surgeon), Aleve, Naproxen, Ibuprofen, Motrin, Advil, Goody's, BC's, all herbal medications, fish oil, and all vitamins   Do not wear jewelry  Do not wear lotions, powders, or cologne, or deodorant.  Men may shave face and neck.  Do not bring valuables to the hospital.  [facility] is not responsible for any belongings or valuables.  Contacts, dentures or bridgework may not be worn into surgery.  Leave your suitcase in the car.  After surgery it may be brought to your room.  For patients admitted to the hospital, discharge time will be determined by your treatment team.  Patients discharged the day of surgery will not be allowed to drive home.    Special instructions:   [facility]- Preparing For Surgery  Before surgery, you can play an important role. Because skin is not sterile, your  skin needs to be as free of germs as possible. You can reduce the number of germs on your skin by washing with CHG (chlorahexidine gluconate) Soap before surgery.  CHG is an antiseptic cleaner which kills germs and bonds with the skin to continue killing germs even after washing.  Oral Hygiene is also important to reduce your risk of infection.  Remember - BRUSH YOUR TEETH THE MORNING OF SURGERY  Please do not use if you have an allergy to CHG or antibacterial soaps. If your skin becomes reddened/irritated stop using the CHG.  Do not shave (including legs and underarms) for at least 48 hours prior to first CHG shower. It is OK to shave your face.  Please follow these instructions carefully.   1. Shower the NIGHT BEFORE SURGERY and the MORNING OF SURGERY with CHG.   2. If you chose to wash your hair, wash your hair first as usual with your normal shampoo.  3. After you shampoo, rinse your hair and body thoroughly to remove the shampoo.  4. Use CHG as you would any other liquid soap. You can apply CHG directly to the skin and wash gently with a scrungie or a clean washcloth.   5. Apply the CHG Soap to your body ONLY FROM THE NECK DOWN.  Do not use on open wounds or open sores. Avoid contact with your eyes, ears, mouth and genitals (private parts). Wash Face and genitals (private parts)  with your normal soap.  6. Wash thoroughly, paying special attention to the area where your surgery will  be performed.  7. Thoroughly rinse your body with warm water from the neck down.  8. DO NOT shower/wash with your normal soap after using and rinsing off the CHG Soap.  9. Pat yourself dry with a CLEAN TOWEL.  10. Wear CLEAN PAJAMAS to bed the night before surgery, wear comfortable clothes the morning of surgery  11. Place CLEAN SHEETS on your bed the night of your first shower and DO NOT SLEEP WITH PETS.    Day of Surgery:  Do not apply any deodorants/lotions.  Please wear clean clothes to the  hospital/surgery center.   Remember to brush your teeth.      Please read over the following fact sheets that you were given.

## 2018-03-26 NOTE — Progress Notes (Signed)
PCP - [facility] Cardiologist -  [facility]  Chest x-ray - not needed EKG - 05/10/17 Stress Test - 2017 ECHO - 2018 Cardiac [id number] -    Aspirin Instructions: continue aspirin per dr. Arbie Cookey office  Anesthesia review: yes for heart history   Patient denies shortness of breath, fever, cough and chest pain at PAT appointment   Patient verbalized understanding of instructions that were given to them at the PAT appointment. Patient was also instructed that they will need to review over the PAT instructions again at home before surgery.

## 2018-03-26 NOTE — Progress Notes (Signed)
Called pt and notified pt of positive PCR result of Staph. [facility] is closed. I told pt that I would call in the Mupirocin Ointment in the AM. Pt voiced understanding.

## 2018-03-27 ENCOUNTER — Encounter (HOSPITAL_COMMUNITY): Payer: Self-pay

## 2018-03-27 NOTE — Progress Notes (Signed)
Mupirocin Ointment Rx into [facility] for positive PCR of Staph Wednesday, 03/27/18 at 8:32 AM.

## 2018-03-27 NOTE — Progress Notes (Addendum)
Anesthesia Chart Review:  Case:  [id number] Date/Time:  03/29/18 0715   Procedure:  ENDARTERECTOMY CAROTID RIGHT (Right )   Anesthesia type:  General   Pre-op diagnosis:  RIGHT CAROTID STENOSIS   Location:  [facility] OR ROOM 11 / [facility] OR   Surgeon:  Larina Earthly, MD      DISCUSSION: Patient is a 77 year old male scheduled for the above procedure. 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 Lestine Mount, [facility]; LAD DES 02/20/04, Dr. Nanetta Batty, [facility]), chest pain 04/2017 (patent LAD stent, RIMA graft, 90% non-dominant CX that would be difficult PCI, medical therapy recommended 05/10/17), carotid artery stenosis (carotid stent '04 ?, although not documented on 04/25/17 CTA; 80-99% RICA, 40-59% LICA 03/2017 U/S), HTN, ETOH abuse (not currently).   He had cath less then 1 year ago with medical therapy recommended. He denied chest pain, SOB, fever, cough at PAT. He will continue ASA perioperatively. Dr. Bosie Helper staff was going to verify no additional recommendations per his cardiology team, but based on currently available information it is anticipated that he can proceed as planned. (Update: Letter under Media tab states, "Patient is felt to be acceptable risk for planned R CEA from a CV standpoint" per Roxanne Mins, PA-C.)    VS: BP (!) 149/65   Pulse 62   Temp 36.6 C   Resp 18   Ht 5\' 8"  (1.727 m)   Wt 218 lb 9.6 oz (99.2 kg)   SpO2 95%   BMI 33.24 kg/m   PROVIDERS: Patient, No Pcp Per Dr. Lollie Marrow at [facility], but I believe he primarily sees Roxanne Mins, PA-C who manages primary and cardiology needs. He sees Dr. Nanetta Batty when he requires coronary angiography (last 04/2017). Last visit with Roxanne Mins was on 02/27/18.  LABS: Labs reviewed: Acceptable for surgery. (all labs ordered are listed, but only abnormal results are displayed)  Labs Reviewed  SURGICAL PCR SCREEN - Abnormal; Notable for the following components:      Result Value   Staphylococcus aureus POSITIVE (*)    All other components within normal limits  COMPREHENSIVE METABOLIC PANEL - Abnormal; Notable for the following components:   Glucose, Bld 135 (*)    Total Protein 6.2 (*)    ALT 14 (*)    All other components within normal limits  APTT  CBC  PROTIME-INR  URINALYSIS, ROUTINE W REFLEX MICROSCOPIC  TYPE AND SCREEN  ABO/RH    EKG: 05/10/17: NSR, nonspecific ST abnormality. Baseline artifact in V3.   CV: Cardiac cath 05/10/17:  Ost LAD to Mid LAD lesion, 0 %stenosed.  Ost Cx to Prox Cx lesion, 90 %stenosed.  Ost LM to LM lesion, 40 %stenosed.  Ost RCA to Prox RCA lesion, 100 %stenosed.  RIMA and is normal in caliber and anatomically normal.  The left ventricular systolic function is normal.  LV end diastolic pressure is normal.  The left ventricular ejection fraction is 55-65% by visual estimate. IMPRESSION: Kevin Gonzales has a patent proximal LAD stent and a patent RIMA graft to the distal right coronary artery with normal LV function. He does appear to have a high-grade ostial nondominant circumflex which would be difficult to treat percutaneously as well as mild to moderate diffuse left main disease in the 30-40% range. I recommend continued medical therapy.  Echo 11/17/16 [facility]; scanned under Results Review tab): Conclusions: 1.  The left atrium is moderately dilated. 2.  Otherwise normal cardiac chamber sizes  and function; normal valve anatomy and function; no pericardial effusion or intracardiac mass.  Normal thoracic aorta and aortic arch.  Carotid 03/19/18: Final Interpretation: Right Carotid: Velocities in the right ICA are consistent with a 80-99%        stenosis. The ECA appears occluded. Left Carotid: Velocities in the left ICA are consistent with a 40-59% stenosis.       The ECA appears >50% stenosed. Vertebrals: Left vertebral artery demonstrates antegrade flow. Right vertebral        artery demonstrates retrograde flow. Subclavians: Right subclavian artery was stenotic. Normal flow hemodynamics were       seen in the left subclavian artery.  Past Medical History:  Diagnosis Date  . Abdominal obesity   . Alcohol abuse   . Arthritis   . Carotid bruit   . Chest pain   . Chondrodermatitis nodularis helicis, left   . Coronary artery disease   . GERD (gastroesophageal reflux disease)   . Hyperlipidemia   . Hypertension   . Myocardial infarction (HCC) 1991  . Thyroid disease   . Vitamin D deficiency     Past Surgical History:  Procedure Laterality Date  . CAROTID STENT  2004  . CORONARY ARTERY BYPASS GRAFT     1 vessel  . LEFT HEART CATH AND CORS/GRAFTS ANGIOGRAPHY N/A 05/10/2017   Procedure: Left Heart Cath and Cors/Grafts Angiography;  Surgeon: Runell Gess, MD;  Location: [facility] INVASIVE CV LAB;  Service: Cardiovascular;  Laterality: N/A;    MEDICATIONS: . aspirin EC 81 MG tablet  . atenolol (TENORMIN) 25 MG tablet  . beclomethasone (QVAR) 80 MCG/ACT inhaler  . finasteride (PROSCAR) 5 MG tablet  . HYDROcodone-acetaminophen (NORCO) 7.5-325 MG tablet  . losartan (COZAAR) 50 MG tablet  . meclizine (ANTIVERT) 25 MG tablet  . nitroGLYCERIN (NITROSTAT) 0.4 MG SL tablet  . Omega-3 Fatty Acids (FISH OIL ULTRA) 1400 MG CAPS  . omeprazole (PRILOSEC) 20 MG capsule  . pravastatin (PRAVACHOL) 10 MG tablet  . Vitamin D, Ergocalciferol, (DRISDOL) 50000 units CAPS capsule   No current facility-administered medications for this encounter.    Velna Ochs [facility] Short Stay Center/Anesthesiology Phone [phone] 03/27/2018 9:47 AM

## 2018-03-28 ENCOUNTER — Encounter (HOSPITAL_COMMUNITY): Payer: Self-pay | Admitting: Anesthesiology

## 2018-03-28 NOTE — Anesthesia Preprocedure Evaluation (Addendum)
Anesthesia Evaluation  Patient identified by MRN, date of birth, ID band Patient awake    Reviewed: Allergy & Precautions, NPO status , Patient's Chart, lab work & pertinent test results  Airway Mallampati: II  TM Distance: >3 FB Neck ROM: Full    Dental  (+) Teeth Intact, Dental Advisory Given   Pulmonary former smoker,    breath sounds clear to auscultation       Cardiovascular hypertension, Pt. on home beta blockers and Pt. on medications + CAD, + Past MI, + CABG and + Peripheral Vascular Disease   Rhythm:Regular Rate:Normal     Neuro/Psych PSYCHIATRIC DISORDERS negative neurological ROS     GI/Hepatic Neg liver ROS, GERD  Medicated,  Endo/Other  negative endocrine ROS  Renal/GU negative Renal ROS  negative genitourinary   Musculoskeletal  (+) Arthritis ,   Abdominal Normal abdominal exam  (+)   Peds  Hematology negative hematology ROS (+)   Anesthesia Other Findings - HLD  Reproductive/Obstetrics                            Lab Results  Component Value Date   WBC 7.2 03/26/2018   HGB 13.8 03/26/2018   HCT 42.3 03/26/2018   MCV 94.2 03/26/2018   PLT 217 03/26/2018   Lab Results  Component Value Date   CREATININE 0.94 03/26/2018   BUN 17 03/26/2018   NA 139 03/26/2018   K 3.6 03/26/2018   CL 105 03/26/2018   CO2 28 03/26/2018   Lab Results  Component Value Date   INR 1.07 03/26/2018   INR 0.9 05/02/2017   EKG: NSR  Cath:  Ost LAD to Mid LAD lesion, 0 %stenosed.  Ost Cx to Prox Cx lesion, 90 %stenosed.  Ost LM to LM lesion, 40 %stenosed.  Ost RCA to Prox RCA lesion, 100 %stenosed.  RIMA and is normal in caliber and anatomically normal.  The left ventricular systolic function is normal.  LV end diastolic pressure is normal.  The left ventricular ejection fraction is 55-65% by visual estimate.   Anesthesia Physical Anesthesia Plan  ASA: III  Anesthesia  Plan: General   Post-op Pain Management:    Induction: Intravenous  PONV Risk Score and Plan: 3 and Ondansetron, Dexamethasone and Treatment may vary due to age or medical condition  Airway Management Planned: Oral ETT  Additional Equipment: Arterial line  Intra-op Plan:   Post-operative Plan: Extubation in OR  Informed Consent: I have reviewed the patients History and Physical, chart, labs and discussed the procedure including the risks, benefits and alternatives for the proposed anesthesia with the patient or authorized representative who has indicated his/her understanding and acceptance.   Dental advisory given  Plan Discussed with: CRNA  Anesthesia Plan Comments: (Remifentanil infusion.)       Anesthesia Quick Evaluation

## 2018-03-29 ENCOUNTER — Inpatient Hospital Stay (HOSPITAL_COMMUNITY): Payer: Medicare Other

## 2018-03-29 ENCOUNTER — Inpatient Hospital Stay (HOSPITAL_COMMUNITY): Payer: Medicare Other | Admitting: Emergency Medicine

## 2018-03-29 ENCOUNTER — Encounter (HOSPITAL_COMMUNITY): Payer: Self-pay | Admitting: Critical Care Medicine

## 2018-03-29 ENCOUNTER — Inpatient Hospital Stay (HOSPITAL_COMMUNITY)
Admission: RE | Admit: 2018-03-29 | Discharge: 2018-03-31 | DRG: 039 | Disposition: A | Discharge status: Home / Self Care | Payer: Medicare Other | Source: Ambulatory Visit | Attending: Vascular Surgery | Admitting: Vascular Surgery

## 2018-03-29 ENCOUNTER — Encounter (HOSPITAL_COMMUNITY)
Admission: RE | Disposition: A | Discharge status: Home / Self Care | Payer: Self-pay | Source: Ambulatory Visit | Attending: Vascular Surgery

## 2018-03-29 ENCOUNTER — Telehealth: Payer: Self-pay | Admitting: Vascular Surgery

## 2018-03-29 DIAGNOSIS — I6521 Occlusion and stenosis of right carotid artery: Secondary | ICD-10-CM | POA: Diagnosis present

## 2018-03-29 DIAGNOSIS — I4891 Unspecified atrial fibrillation: Secondary | ICD-10-CM | POA: Diagnosis not present

## 2018-03-29 DIAGNOSIS — Z7982 Long term (current) use of aspirin: Secondary | ICD-10-CM | POA: Diagnosis not present

## 2018-03-29 DIAGNOSIS — D72829 Elevated white blood cell count, unspecified: Secondary | ICD-10-CM | POA: Diagnosis not present

## 2018-03-29 DIAGNOSIS — I119 Hypertensive heart disease without heart failure: Secondary | ICD-10-CM | POA: Diagnosis not present

## 2018-03-29 DIAGNOSIS — I739 Peripheral vascular disease, unspecified: Secondary | ICD-10-CM | POA: Diagnosis present

## 2018-03-29 DIAGNOSIS — E669 Obesity, unspecified: Secondary | ICD-10-CM | POA: Diagnosis present

## 2018-03-29 DIAGNOSIS — I480 Paroxysmal atrial fibrillation: Secondary | ICD-10-CM | POA: Diagnosis not present

## 2018-03-29 DIAGNOSIS — E785 Hyperlipidemia, unspecified: Secondary | ICD-10-CM | POA: Diagnosis not present

## 2018-03-29 DIAGNOSIS — Z955 Presence of coronary angioplasty implant and graft: Secondary | ICD-10-CM | POA: Diagnosis not present

## 2018-03-29 DIAGNOSIS — Z7951 Long term (current) use of inhaled steroids: Secondary | ICD-10-CM | POA: Diagnosis not present

## 2018-03-29 DIAGNOSIS — Z951 Presence of aortocoronary bypass graft: Secondary | ICD-10-CM

## 2018-03-29 DIAGNOSIS — Z6833 Body mass index (BMI) 33.0-33.9, adult: Secondary | ICD-10-CM | POA: Diagnosis not present

## 2018-03-29 DIAGNOSIS — K219 Gastro-esophageal reflux disease without esophagitis: Secondary | ICD-10-CM | POA: Diagnosis not present

## 2018-03-29 DIAGNOSIS — R001 Bradycardia, unspecified: Secondary | ICD-10-CM | POA: Diagnosis not present

## 2018-03-29 DIAGNOSIS — Z87891 Personal history of nicotine dependence: Secondary | ICD-10-CM | POA: Diagnosis not present

## 2018-03-29 DIAGNOSIS — I252 Old myocardial infarction: Secondary | ICD-10-CM

## 2018-03-29 DIAGNOSIS — N179 Acute kidney failure, unspecified: Secondary | ICD-10-CM | POA: Diagnosis not present

## 2018-03-29 DIAGNOSIS — I6523 Occlusion and stenosis of bilateral carotid arteries: Principal | ICD-10-CM | POA: Diagnosis present

## 2018-03-29 DIAGNOSIS — I9581 Postprocedural hypotension: Secondary | ICD-10-CM | POA: Diagnosis not present

## 2018-03-29 DIAGNOSIS — N289 Disorder of kidney and ureter, unspecified: Secondary | ICD-10-CM | POA: Diagnosis not present

## 2018-03-29 DIAGNOSIS — I2510 Atherosclerotic heart disease of native coronary artery without angina pectoris: Secondary | ICD-10-CM | POA: Diagnosis not present

## 2018-03-29 HISTORY — PX: CAROTID ENDARTERECTOMY: SUR193

## 2018-03-29 HISTORY — PX: ENDARTERECTOMY: SHX5162

## 2018-03-29 HISTORY — PX: PATCH ANGIOPLASTY: SHX6230

## 2018-03-29 SURGERY — ENDARTERECTOMY, CAROTID
Anesthesia: General | Site: Neck | Laterality: Right

## 2018-03-29 MED ORDER — GUAIFENESIN-DM 100-10 MG/5ML PO SYRP: 15.0000 mL | ORAL_SOLUTION | ORAL | Status: DC | PRN

## 2018-03-29 MED ORDER — HYDROCODONE-ACETAMINOPHEN 7.5-325 MG PO TABS: 1.0000 | ORAL_TABLET | Freq: Four times a day (QID) | ORAL | 0 refills | Status: DC | PRN

## 2018-03-29 MED ORDER — CEFAZOLIN SODIUM-DEXTROSE 2-4 GM/100ML-% IV SOLN
2.0000 g | INTRAVENOUS | Status: AC
  Administered 2018-03-29: 2 g via INTRAVENOUS

## 2018-03-29 MED ORDER — LACTATED RINGERS IV SOLN: INTRAVENOUS | Status: DC

## 2018-03-29 MED ORDER — LACTATED RINGERS IV SOLN
INTRAVENOUS | Status: DC | PRN
  Administered 2018-03-29: 08:00:00 via INTRAVENOUS

## 2018-03-29 MED ORDER — DEXAMETHASONE SODIUM PHOSPHATE 10 MG/ML IJ SOLN
INTRAMUSCULAR | Status: AC
  Filled 2018-03-29: qty 1

## 2018-03-29 MED ORDER — FISH OIL ULTRA 1400 MG PO CAPS: 1400.0000 mg | ORAL_CAPSULE | Freq: Every day | ORAL | Status: DC

## 2018-03-29 MED ORDER — MORPHINE SULFATE (PF) 2 MG/ML IV SOLN: 2.0000 mg | INTRAVENOUS | Status: DC | PRN

## 2018-03-29 MED ORDER — PHENYLEPHRINE 40 MCG/ML (10ML) SYRINGE FOR IV PUSH (FOR BLOOD PRESSURE SUPPORT)
PREFILLED_SYRINGE | INTRAVENOUS | Status: AC
  Filled 2018-03-29: qty 10

## 2018-03-29 MED ORDER — GLYCOPYRROLATE 0.2 MG/ML IJ SOLN
INTRAMUSCULAR | Status: DC | PRN
  Administered 2018-03-29 (×2): 0.2 mg via INTRAVENOUS

## 2018-03-29 MED ORDER — MEPERIDINE HCL 50 MG/ML IJ SOLN: 6.2500 mg | INTRAMUSCULAR | Status: DC | PRN

## 2018-03-29 MED ORDER — PRAVASTATIN SODIUM 20 MG PO TABS
10.0000 mg | ORAL_TABLET | Freq: Every day | ORAL | Status: DC
  Administered 2018-03-29 – 2018-03-30 (×2): 10 mg via ORAL
  Filled 2018-03-29 (×2): qty 1

## 2018-03-29 MED ORDER — PROTAMINE SULFATE 10 MG/ML IV SOLN
INTRAVENOUS | Status: DC | PRN
  Administered 2018-03-29: 50 mg via INTRAVENOUS

## 2018-03-29 MED ORDER — SODIUM CHLORIDE 0.9 % IV SOLN
0.1000 ug/kg/min | INTRAVENOUS | Status: DC
  Filled 2018-03-29: qty 5000

## 2018-03-29 MED ORDER — LIDOCAINE 2% (20 MG/ML) 5 ML SYRINGE
INTRAMUSCULAR | Status: AC
  Filled 2018-03-29: qty 5

## 2018-03-29 MED ORDER — PANTOPRAZOLE SODIUM 40 MG PO TBEC
40.0000 mg | DELAYED_RELEASE_TABLET | Freq: Every day | ORAL | Status: DC
  Administered 2018-03-30 – 2018-03-31 (×2): 40 mg via ORAL
  Filled 2018-03-29 (×2): qty 1

## 2018-03-29 MED ORDER — ESMOLOL HCL 100 MG/10ML IV SOLN
INTRAVENOUS | Status: DC | PRN
  Administered 2018-03-29: 30 mg via INTRAVENOUS
  Administered 2018-03-29: 20 mg via INTRAVENOUS

## 2018-03-29 MED ORDER — BISACODYL 10 MG RE SUPP: 10.0000 mg | Freq: Every day | RECTAL | Status: DC | PRN

## 2018-03-29 MED ORDER — ROCURONIUM BROMIDE 10 MG/ML (PF) SYRINGE
PREFILLED_SYRINGE | INTRAVENOUS | Status: DC | PRN
  Administered 2018-03-29: 50 mg via INTRAVENOUS

## 2018-03-29 MED ORDER — OXYCODONE HCL 5 MG PO TABS
ORAL_TABLET | ORAL | Status: AC
  Filled 2018-03-29: qty 1

## 2018-03-29 MED ORDER — FENTANYL CITRATE (PF) 100 MCG/2ML IJ SOLN
INTRAMUSCULAR | Status: AC
  Filled 2018-03-29: qty 2

## 2018-03-29 MED ORDER — SODIUM CHLORIDE 0.9 % IV SOLN
INTRAVENOUS | Status: AC
  Filled 2018-03-29: qty 1.2

## 2018-03-29 MED ORDER — PROPOFOL 10 MG/ML IV BOLUS
INTRAVENOUS | Status: AC
  Filled 2018-03-29: qty 20

## 2018-03-29 MED ORDER — ROCURONIUM BROMIDE 50 MG/5ML IV SOLN
INTRAVENOUS | Status: AC
  Filled 2018-03-29: qty 2

## 2018-03-29 MED ORDER — CHLORHEXIDINE GLUCONATE 4 % EX LIQD: 60.0000 mL | Freq: Once | CUTANEOUS | Status: DC

## 2018-03-29 MED ORDER — NITROGLYCERIN 0.4 MG SL SUBL: 0.4000 mg | SUBLINGUAL_TABLET | SUBLINGUAL | Status: DC | PRN

## 2018-03-29 MED ORDER — MAGNESIUM SULFATE 2 GM/50ML IV SOLN: 2.0000 g | Freq: Every day | INTRAVENOUS | Status: DC | PRN

## 2018-03-29 MED ORDER — 0.9 % SODIUM CHLORIDE (POUR BTL) OPTIME
TOPICAL | Status: DC | PRN
  Administered 2018-03-29: 2000 mL

## 2018-03-29 MED ORDER — CEFAZOLIN SODIUM-DEXTROSE 2-4 GM/100ML-% IV SOLN
2.0000 g | Freq: Three times a day (TID) | INTRAVENOUS | Status: AC
  Administered 2018-03-29 (×2): 2 g via INTRAVENOUS
  Filled 2018-03-29 (×2): qty 100

## 2018-03-29 MED ORDER — DOPAMINE-DEXTROSE 3.2-5 MG/ML-% IV SOLN
5.0000 ug/kg/min | INTRAVENOUS | Status: DC
  Administered 2018-03-29: 5 ug/kg/min via INTRAVENOUS
  Filled 2018-03-29: qty 250

## 2018-03-29 MED ORDER — OXYCODONE HCL 5 MG PO TABS
5.0000 mg | ORAL_TABLET | Freq: Once | ORAL | Status: AC
  Administered 2018-03-29: 5 mg via ORAL

## 2018-03-29 MED ORDER — ATENOLOL 25 MG PO TABS: 12.5000 mg | ORAL_TABLET | Freq: Every day | ORAL | Status: DC

## 2018-03-29 MED ORDER — FENTANYL CITRATE (PF) 250 MCG/5ML IJ SOLN
INTRAMUSCULAR | Status: AC
  Filled 2018-03-29: qty 5

## 2018-03-29 MED ORDER — LABETALOL HCL 5 MG/ML IV SOLN: 10.0000 mg | INTRAVENOUS | Status: DC | PRN

## 2018-03-29 MED ORDER — FINASTERIDE 5 MG PO TABS
5.0000 mg | ORAL_TABLET | Freq: Every day | ORAL | Status: DC
  Administered 2018-03-30 – 2018-03-31 (×2): 5 mg via ORAL
  Filled 2018-03-29 (×2): qty 1

## 2018-03-29 MED ORDER — HEPARIN SODIUM (PORCINE) 1000 UNIT/ML IJ SOLN
INTRAMUSCULAR | Status: DC | PRN
  Administered 2018-03-29: 9000 [IU] via INTRAVENOUS

## 2018-03-29 MED ORDER — LIDOCAINE HCL (PF) 1 % IJ SOLN
INTRAMUSCULAR | Status: AC
  Filled 2018-03-29: qty 30

## 2018-03-29 MED ORDER — HEPARIN SODIUM (PORCINE) 1000 UNIT/ML IJ SOLN
INTRAMUSCULAR | Status: AC
  Filled 2018-03-29: qty 1

## 2018-03-29 MED ORDER — HYDROMORPHONE HCL 2 MG/ML IJ SOLN: 0.2500 mg | INTRAMUSCULAR | Status: DC | PRN

## 2018-03-29 MED ORDER — REMIFENTANIL HCL 1 MG IV SOLR
INTRAVENOUS | Status: DC | PRN
  Administered 2018-03-29: .2 ug/kg/min via INTRAVENOUS

## 2018-03-29 MED ORDER — FENTANYL CITRATE (PF) 100 MCG/2ML IJ SOLN
25.0000 ug | INTRAMUSCULAR | Status: DC | PRN
  Administered 2018-03-29 (×5): 25 ug via INTRAVENOUS

## 2018-03-29 MED ORDER — ALUM & MAG HYDROXIDE-SIMETH 200-200-20 MG/5ML PO SUSP
15.0000 mL | ORAL | Status: DC | PRN
  Administered 2018-03-30 – 2018-03-31 (×2): 30 mL via ORAL
  Filled 2018-03-29 (×2): qty 30

## 2018-03-29 MED ORDER — SODIUM CHLORIDE 0.9 % IV SOLN
500.0000 mL | Freq: Once | INTRAVENOUS | Status: AC | PRN
  Administered 2018-03-29: 500 mL via INTRAVENOUS

## 2018-03-29 MED ORDER — LIDOCAINE 2% (20 MG/ML) 5 ML SYRINGE
INTRAMUSCULAR | Status: DC | PRN
  Administered 2018-03-29: 50 mg via INTRAVENOUS

## 2018-03-29 MED ORDER — PROMETHAZINE HCL 25 MG/ML IJ SOLN
INTRAMUSCULAR | Status: AC
  Filled 2018-03-29: qty 1

## 2018-03-29 MED ORDER — ONDANSETRON HCL 4 MG/2ML IJ SOLN
INTRAMUSCULAR | Status: AC
  Filled 2018-03-29: qty 2

## 2018-03-29 MED ORDER — PHENYLEPHRINE HCL 10 MG/ML IJ SOLN
INTRAMUSCULAR | Status: DC | PRN
  Administered 2018-03-29: 85 ug/min via INTRAVENOUS
  Administered 2018-03-29: 20 ug/min via INTRAVENOUS

## 2018-03-29 MED ORDER — ACETAMINOPHEN 325 MG RE SUPP: 325.0000 mg | RECTAL | Status: DC | PRN

## 2018-03-29 MED ORDER — LOSARTAN POTASSIUM 50 MG PO TABS
50.0000 mg | ORAL_TABLET | Freq: Every day | ORAL | Status: DC
  Administered 2018-03-30: 50 mg via ORAL
  Filled 2018-03-29: qty 1

## 2018-03-29 MED ORDER — LACTATED RINGERS IV SOLN
INTRAVENOUS | Status: DC | PRN
  Administered 2018-03-29: 07:00:00 via INTRAVENOUS

## 2018-03-29 MED ORDER — FENTANYL CITRATE (PF) 100 MCG/2ML IJ SOLN
INTRAMUSCULAR | Status: AC
  Administered 2018-03-29: 25 ug via INTRAVENOUS
  Filled 2018-03-29: qty 2

## 2018-03-29 MED ORDER — PROTAMINE SULFATE 10 MG/ML IV SOLN
INTRAVENOUS | Status: AC
  Filled 2018-03-29: qty 5

## 2018-03-29 MED ORDER — PROPOFOL 10 MG/ML IV BOLUS
INTRAVENOUS | Status: DC | PRN
  Administered 2018-03-29: 150 mg via INTRAVENOUS
  Administered 2018-03-29: 10 mg via INTRAVENOUS

## 2018-03-29 MED ORDER — PROMETHAZINE HCL 25 MG/ML IJ SOLN: 6.2500 mg | INTRAMUSCULAR | Status: DC | PRN

## 2018-03-29 MED ORDER — PHENOL 1.4 % MT LIQD
1.0000 | OROMUCOSAL | Status: DC | PRN
  Administered 2018-03-30: 1 via OROMUCOSAL
  Filled 2018-03-29: qty 177

## 2018-03-29 MED ORDER — CEFAZOLIN SODIUM-DEXTROSE 2-4 GM/100ML-% IV SOLN
INTRAVENOUS | Status: AC
Start: 2018-03-29 — End: 2018-03-29
  Filled 2018-03-29: qty 100

## 2018-03-29 MED ORDER — POTASSIUM CHLORIDE CRYS ER 20 MEQ PO TBCR: 20.0000 meq | EXTENDED_RELEASE_TABLET | Freq: Every day | ORAL | Status: DC | PRN

## 2018-03-29 MED ORDER — ACETAMINOPHEN 325 MG PO TABS: 325.0000 mg | ORAL_TABLET | ORAL | Status: DC | PRN

## 2018-03-29 MED ORDER — ASPIRIN EC 81 MG PO TBEC
81.0000 mg | DELAYED_RELEASE_TABLET | Freq: Every day | ORAL | Status: DC
  Administered 2018-03-30 – 2018-03-31 (×2): 81 mg via ORAL
  Filled 2018-03-29 (×2): qty 1

## 2018-03-29 MED ORDER — HYDRALAZINE HCL 20 MG/ML IJ SOLN: 5.0000 mg | INTRAMUSCULAR | Status: DC | PRN

## 2018-03-29 MED ORDER — SUGAMMADEX SODIUM 200 MG/2ML IV SOLN
INTRAVENOUS | Status: DC | PRN
  Administered 2018-03-29: 200 mg via INTRAVENOUS

## 2018-03-29 MED ORDER — POLYETHYLENE GLYCOL 3350 17 G PO PACK: 17.0000 g | PACK | Freq: Every day | ORAL | Status: DC | PRN

## 2018-03-29 MED ORDER — DOCUSATE SODIUM 100 MG PO CAPS
100.0000 mg | ORAL_CAPSULE | Freq: Every day | ORAL | Status: DC
  Administered 2018-03-30 – 2018-03-31 (×2): 100 mg via ORAL
  Filled 2018-03-29 (×2): qty 1

## 2018-03-29 MED ORDER — VITAMIN D (ERGOCALCIFEROL) 1.25 MG (50000 UNIT) PO CAPS
50000.0000 [IU] | ORAL_CAPSULE | ORAL | Status: DC
  Administered 2018-03-31: 50000 [IU] via ORAL
  Filled 2018-03-29: qty 1

## 2018-03-29 MED ORDER — ONDANSETRON HCL 4 MG/2ML IJ SOLN
INTRAMUSCULAR | Status: DC | PRN
  Administered 2018-03-29: 4 mg via INTRAVENOUS

## 2018-03-29 MED ORDER — ESMOLOL HCL 100 MG/10ML IV SOLN
INTRAVENOUS | Status: AC
  Filled 2018-03-29: qty 10

## 2018-03-29 MED ORDER — BUDESONIDE 0.25 MG/2ML IN SUSP
0.2500 mg | Freq: Two times a day (BID) | RESPIRATORY_TRACT | Status: DC
  Administered 2018-03-29 – 2018-03-31 (×4): 0.25 mg via RESPIRATORY_TRACT
  Filled 2018-03-29 (×4): qty 2

## 2018-03-29 MED ORDER — ONDANSETRON HCL 4 MG/2ML IJ SOLN
4.0000 mg | Freq: Four times a day (QID) | INTRAMUSCULAR | Status: DC | PRN
  Administered 2018-03-30 (×3): 4 mg via INTRAVENOUS
  Filled 2018-03-29 (×4): qty 2

## 2018-03-29 MED ORDER — PHENYLEPHRINE HCL 10 MG/ML IJ SOLN
INTRAMUSCULAR | Status: DC | PRN
  Administered 2018-03-29 (×2): 40 ug via INTRAVENOUS
  Administered 2018-03-29: 80 ug via INTRAVENOUS
  Administered 2018-03-29: 40 ug via INTRAVENOUS
  Administered 2018-03-29: 80 ug via INTRAVENOUS
  Administered 2018-03-29: 40 ug via INTRAVENOUS

## 2018-03-29 MED ORDER — MECLIZINE HCL 25 MG PO TABS: 25.0000 mg | ORAL_TABLET | Freq: Three times a day (TID) | ORAL | Status: DC | PRN

## 2018-03-29 MED ORDER — METOPROLOL TARTRATE 5 MG/5ML IV SOLN: 2.0000 mg | INTRAVENOUS | Status: DC | PRN

## 2018-03-29 MED ORDER — DEXAMETHASONE SODIUM PHOSPHATE 10 MG/ML IJ SOLN
INTRAMUSCULAR | Status: DC | PRN
  Administered 2018-03-29: 10 mg via INTRAVENOUS

## 2018-03-29 MED ORDER — HEPARIN SODIUM (PORCINE) 5000 UNIT/ML IJ SOLN
INTRAMUSCULAR | Status: DC | PRN
  Administered 2018-03-29: 500 mL

## 2018-03-29 MED ORDER — HYDROCODONE-ACETAMINOPHEN 7.5-325 MG PO TABS
1.0000 | ORAL_TABLET | Freq: Four times a day (QID) | ORAL | Status: DC | PRN
Start: 2018-03-29 — End: 2018-03-31
  Administered 2018-03-29 – 2018-03-31 (×4): 1 via ORAL
  Filled 2018-03-29 (×4): qty 1

## 2018-03-29 MED ORDER — SODIUM CHLORIDE 0.9 % IV SOLN
INTRAVENOUS | Status: DC
  Administered 2018-03-29: 16:00:00 via INTRAVENOUS

## 2018-03-29 SURGICAL SUPPLY — 46 items
CANISTER SUCT 3000ML PPV (MISCELLANEOUS) ×3 IMPLANT
CANNULA VESSEL 3MM 2 BLNT TIP (CANNULA) ×6 IMPLANT
CATH ROBINSON RED A/P 18FR (CATHETERS) ×3 IMPLANT
CLIP LIGATING EXTRA MED SLVR (CLIP) ×3 IMPLANT
CLIP LIGATING EXTRA SM BLUE (MISCELLANEOUS) ×3 IMPLANT
CRADLE DONUT ADULT HEAD (MISCELLANEOUS) ×3 IMPLANT
DECANTER SPIKE VIAL GLASS SM (MISCELLANEOUS) IMPLANT
DERMABOND ADVANCED (GAUZE/BANDAGES/DRESSINGS) ×2
DERMABOND ADVANCED .7 DNX12 (GAUZE/BANDAGES/DRESSINGS) ×1 IMPLANT
DRAIN HEMOVAC 1/8 X 5 (WOUND CARE) IMPLANT
ELECT REM PT RETURN 9FT ADLT (ELECTROSURGICAL) ×3
ELECTRODE REM PT RTRN 9FT ADLT (ELECTROSURGICAL) ×1 IMPLANT
EVACUATOR SILICONE 100CC (DRAIN) IMPLANT
GLOVE BIO SURGEON STRL SZ 6.5 (GLOVE) ×2 IMPLANT
GLOVE BIO SURGEON STRL SZ7 (GLOVE) ×3 IMPLANT
GLOVE BIO SURGEONS STRL SZ 6.5 (GLOVE) ×1
GLOVE BIOGEL PI IND STRL 6.5 (GLOVE) ×2 IMPLANT
GLOVE BIOGEL PI IND STRL 7.0 (GLOVE) ×1 IMPLANT
GLOVE BIOGEL PI IND STRL 7.5 (GLOVE) ×1 IMPLANT
GLOVE BIOGEL PI INDICATOR 6.5 (GLOVE) ×4
GLOVE BIOGEL PI INDICATOR 7.0 (GLOVE) ×2
GLOVE BIOGEL PI INDICATOR 7.5 (GLOVE) ×2
GLOVE SS BIOGEL STRL SZ 7.5 (GLOVE) ×1 IMPLANT
GLOVE SUPERSENSE BIOGEL SZ 7.5 (GLOVE) ×2
GOWN STRL REUS W/ TWL LRG LVL3 (GOWN DISPOSABLE) ×4 IMPLANT
GOWN STRL REUS W/TWL LRG LVL3 (GOWN DISPOSABLE) ×8
KIT BASIN OR (CUSTOM PROCEDURE TRAY) ×3 IMPLANT
KIT SHUNT ARGYLE CAROTID ART 6 (VASCULAR PRODUCTS) IMPLANT
KIT TURNOVER KIT B (KITS) ×3 IMPLANT
NEEDLE 22X1 1/2 (OR ONLY) (NEEDLE) IMPLANT
NS IRRIG 1000ML POUR BTL (IV SOLUTION) ×6 IMPLANT
PACK CAROTID (CUSTOM PROCEDURE TRAY) ×3 IMPLANT
PAD ARMBOARD 7.5X6 YLW CONV (MISCELLANEOUS) ×6 IMPLANT
PATCH HEMASHIELD 8X75 (Vascular Products) ×3 IMPLANT
SHUNT CAROTID BYPASS 10 (VASCULAR PRODUCTS) ×3 IMPLANT
SHUNT CAROTID BYPASS 12FRX15.5 (VASCULAR PRODUCTS) IMPLANT
SUT ETHILON 3 0 PS 1 (SUTURE) IMPLANT
SUT PROLENE 6 0 CC (SUTURE) ×6 IMPLANT
SUT SILK 3 0 (SUTURE)
SUT SILK 3-0 18XBRD TIE 12 (SUTURE) IMPLANT
SUT VIC AB 3-0 SH 27 (SUTURE) ×4
SUT VIC AB 3-0 SH 27X BRD (SUTURE) ×2 IMPLANT
SUT VICRYL 4-0 PS2 18IN ABS (SUTURE) ×3 IMPLANT
SYR CONTROL 10ML LL (SYRINGE) IMPLANT
TOWEL GREEN STERILE (TOWEL DISPOSABLE) ×3 IMPLANT
WATER STERILE IRR 1000ML POUR (IV SOLUTION) ×3 IMPLANT

## 2018-03-29 NOTE — Progress Notes (Signed)
Patient converted from afib to sinus brady with HR in 40's-50's, BP 101/53.  Patient remains comfortable currently.  MD notified.  Will continue to monitor.

## 2018-03-29 NOTE — Anesthesia Postprocedure Evaluation (Signed)
Anesthesia Post Note  Patient: Kevin Gonzales  Procedure(s) Performed: Right Carotid Endartarectomy (Right Neck) PATCH ANGIOPLASTY (Right Neck)     Patient location during evaluation: PACU Anesthesia Type: General Level of consciousness: awake and alert Pain management: pain level controlled Vital Signs Assessment: post-procedure vital signs reviewed and stable Respiratory status: spontaneous breathing, nonlabored ventilation, respiratory function stable and patient connected to nasal cannula oxygen Cardiovascular status: blood pressure returned to baseline and stable Postop Assessment: no apparent nausea or vomiting Anesthetic complications: no    Last Vitals:  Vitals:   03/29/18 1333 03/29/18 1430  BP:    Pulse: 77 83  Resp: 18 14  Temp:    SpO2: 93% 91%    Last Pain:  Vitals:   03/29/18 1430  TempSrc:   PainSc: 0-No pain                 Shelton Silvas

## 2018-03-29 NOTE — Transfer of Care (Signed)
Immediate Anesthesia Transfer of Care Note  Patient: Kevin Gonzales  Procedure(s) Performed: Right Carotid Endartarectomy (Right Neck) PATCH ANGIOPLASTY (Right Neck)  Patient Location: PACU  Anesthesia Type:General  Level of Consciousness: awake, alert  and oriented  Airway & Oxygen Therapy: Patient Spontanous Breathing and Patient connected to nasal cannula oxygen  Post-op Assessment: Report given to RN, Post -op Vital signs reviewed and stable, Patient moving all extremities X 4 and Patient able to stick tongue midline  Post vital signs: Reviewed and stable  Last Vitals:  Vitals Value Taken Time  BP 97/55 03/29/2018 10:50 AM  Temp    Pulse 57 03/29/2018 10:53 AM  Resp 22 03/29/2018 10:53 AM  SpO2 98 % 03/29/2018 10:53 AM  Vitals shown include unvalidated device data.  Last Pain:  Vitals:   03/29/18 0623  TempSrc:   PainSc: 0-No pain         Complications: No apparent anesthesia complications

## 2018-03-29 NOTE — Anesthesia Procedure Notes (Signed)
Procedure Name: Intubation Date/Time: 03/29/2018 7:47 AM Performed by: Wilburn Cornelia, CRNA Pre-anesthesia Checklist: Patient identified, Emergency Drugs available, Suction available and Patient being monitored Patient Re-evaluated:Patient Re-evaluated prior to induction Oxygen Delivery Method: Circle System Utilized Preoxygenation: Pre-oxygenation with 100% oxygen Induction Type: IV induction Ventilation: Mask ventilation without difficulty and Oral airway inserted - appropriate to patient size Laryngoscope Size: Mac and 3 Grade View: Grade I Tube type: Oral Tube size: 7.5 mm Number of attempts: 1 Airway Equipment and Method: Stylet and Oral airway Placement Confirmation: ETT inserted through vocal cords under direct vision,  positive ETCO2 and breath sounds checked- equal and bilateral Secured at: 23 (lips) cm Tube secured with: Tape Dental Injury: Teeth and Oropharynx as per pre-operative assessment  Comments: Performed by Azzie Roup SRNA

## 2018-03-29 NOTE — Telephone Encounter (Signed)
Sched appt 04/09/18 at 2:45. Lm on hm# to inform pt of appt.

## 2018-03-29 NOTE — Anesthesia Procedure Notes (Signed)
Arterial Line Insertion Start/End5/17/2019 6:55 AM, 03/29/2018 7:05 AM Performed by: Rachel Moulds, CRNA  Patient location: Pre-op. Preanesthetic checklist: patient identified, IV checked, site marked, risks and benefits discussed, surgical consent, monitors and equipment checked, pre-op evaluation and timeout performed Lidocaine 1% used for infiltration Right, radial was placed Catheter size: 20 G Hand hygiene performed  and maximum sterile barriers used  Allen's test indicative of satisfactory collateral circulation Attempts: 1 Procedure performed without using ultrasound guided technique. Following insertion, Biopatch and dressing applied. Post procedure assessment: normal  Patient tolerated the procedure well with no immediate complications.

## 2018-03-29 NOTE — Op Note (Signed)
    OPERATIVE REPORT  DATE OF SURGERY: 03/29/2018  PATIENT: Kevin Gonzales, 77 y.o. male MRN: [id number]  DOB: September 29, 1941  PRE-OPERATIVE DIAGNOSIS: Severe asymptomatic right internal carotid artery stenosis  POST-OPERATIVE DIAGNOSIS:  Same  PROCEDURE: Right carotid endarterectomy and Dacron patch angioplasty  SURGEON:  [name], M.D.  PHYSICIAN ASSISTANT: Darlin Coco, PA-C  ANESTHESIA: General  EBL: 25 ml  Total I/O In: 1500 [I.V.:1500] Out: 25 [Blood:25]  BLOOD ADMINISTERED: None  DRAINS: None  SPECIMEN: None  COUNTS CORRECT:  YES  PLAN OF CARE: PACU  PATIENT DISPOSITION:  PACU - hemodynamically stable  PROCEDURE DETAILS: The patient was taken to the operating room placed supine position where the area of the right neck was prepped and draped in usual sterile fashion.  An incision was made anterior to the sternocleidomastoid and carried down through the platysma with electrocautery.  The sternocleidomastoid reflected posteriorly and the carotid sheath was opened.  Facial vein was ligated and divided.  The patient had a high bifurcation.  The digastric muscle was divided.  The vagus and hypoglossal nerves were identified.  Branches of veins around the hypoglossal nerve were ligated and divided for better mobilization.  The common carotid artery was encircled with an umbilical tape and Rummel tourniquet.  The external carotid was then encircled with a blue vessel loop and the internal carotid was encircled with an umbilical tape and Rummel tourniquet.  The patient was given 9000 units of intravenous heparin and after adequate circulation time the internal/external and common carotid arteries were occluded.  The common carotid artery was opened with an 11 blade and sent longitudinally with Potts scissors through the plaque onto the internal carotid artery.  A 10 shunt was passed up the internal carotid artery and allowed to backbleed and then down the common carotid where it  was secured with a Rummel tourniquet's.  The endarterectomy was begun on the common carotid artery and the plaque was divided proximally with Potts scissors.  Endarterectomy was extended onto the bifurcation.  The external carotid was endarterectomized with an eversion technique and the internal carotid was endarterectomized in an open fashion.  Remaining atheromatous debris was removed from the endarterectomy plane.  A Finesse Hemashield Dacron patch was brought into the field and was sewn as a patch angioplasty with a running 6-0 Prolene suture.  Prior to completion of the closure the shunt was removed and the occlusion clamps were reapplied.  The anastomosis completed.  The usual flushing maneuvers were undertaken and then the anastomosis was completed.  Flow was referred short first to the external and then the internal carotid artery.  Excellent flow characteristics were noted with hand-held Doppler in the internal and external carotid arteries.  The patient was given 50 mg of protamine to reverse the heparin.  The wounds were closed with 3-0 Vicryl to reapproximate sternocleidomastoid over the carotid sheath.  Next the platysma was closed with running 3-0 Vicryl suture and finally the skin was closed with a 4 sub-particular Vicryl suture.  Sterile dressing was applied.  The patient was awakened in the operating room neurologically intact and was transferred to the recovery room in stable condition   Larina Earthly, M.D., [facility] 03/29/2018 10:51 AM

## 2018-03-29 NOTE — Telephone Encounter (Signed)
-----   Message from Sharee Pimple, RN sent at 03/29/2018 10:37 AM EDT ----- Regarding: 2-3 weeks postop CEA   ----- Message ----- From: Dara Lords, PA-C Sent: 03/29/2018  10:20 AM To: Vvs Charge Pool  S/p right CEA 03/29/18.  F/u with Dr. Arbie Cookey in 2-3 weeks.  Thanks

## 2018-03-29 NOTE — Anesthesia Procedure Notes (Signed)
Arterial Line Insertion Start/End5/17/2019 7:53 AM, 03/29/2018 8:00 AM Performed by: Rachel Moulds, CRNA, CRNA  Patient location: OR. Preanesthetic checklist: patient identified, IV checked, site marked, risks and benefits discussed, surgical consent, monitors and equipment checked, pre-op evaluation, timeout performed and anesthesia consent Left, radial was placed Catheter size: 20 G Hand hygiene performed  and maximum sterile barriers used   Attempts: 1 Procedure performed without using ultrasound guided technique. Following insertion, Biopatch and dressing applied. Post procedure assessment: normal  Patient tolerated the procedure well with no immediate complications.

## 2018-03-29 NOTE — Interval H&P Note (Signed)
History and Physical Interval Note:  03/29/2018 7:21 AM  Kevin Gonzales  has presented today for surgery, with the diagnosis of RIGHT CAROTID STENOSIS  The various methods of treatment have been discussed with the patient and family. After consideration of risks, benefits and other options for treatment, the patient has consented to  Procedure(s): ENDARTERECTOMY CAROTID RIGHT (Right) as a surgical intervention .  The patient's history has been reviewed, patient examined, no change in status, stable for surgery.  I have reviewed the patient's chart and labs.  Questions were answered to the patient's satisfaction.     [name]

## 2018-03-29 NOTE — Progress Notes (Signed)
Patient arrived from PACU to 4E room 14 after right CEA.  Telemetry monitor applied and CCMD notified.  Patient oriented to unit and room to include call light and phone.  Will continue to monitor.

## 2018-03-30 ENCOUNTER — Encounter (HOSPITAL_COMMUNITY): Payer: Self-pay | Admitting: Cardiology

## 2018-03-30 ENCOUNTER — Other Ambulatory Visit: Payer: Self-pay

## 2018-03-30 DIAGNOSIS — I9581 Postprocedural hypotension: Secondary | ICD-10-CM

## 2018-03-30 DIAGNOSIS — N179 Acute kidney failure, unspecified: Secondary | ICD-10-CM

## 2018-03-30 DIAGNOSIS — I4891 Unspecified atrial fibrillation: Secondary | ICD-10-CM

## 2018-03-30 LAB — CBC
HEMATOCRIT: 38.6 % — AB (ref 39.0–52.0)
HEMOGLOBIN: 12.4 g/dL — AB (ref 13.0–17.0)
MCH: 30.8 pg (ref 26.0–34.0)
MCHC: 32.1 g/dL (ref 30.0–36.0)
MCV: 96 fL (ref 78.0–100.0)
Platelets: 206 10*3/uL (ref 150–400)
RBC: 4.02 MIL/uL — AB (ref 4.22–5.81)
RDW: 11.7 % (ref 11.5–15.5)
WBC: 17.2 10*3/uL — ABNORMAL HIGH (ref 4.0–10.5)

## 2018-03-30 LAB — BASIC METABOLIC PANEL
Anion gap: 8 (ref 5–15)
BUN: 28 mg/dL — AB (ref 6–20)
CHLORIDE: 107 mmol/L (ref 101–111)
CO2: 26 mmol/L (ref 22–32)
Calcium: 8.3 mg/dL — ABNORMAL LOW (ref 8.9–10.3)
Creatinine, Ser: 1.38 mg/dL — ABNORMAL HIGH (ref 0.61–1.24)
GFR calc Af Amer: 56 mL/min — ABNORMAL LOW (ref >60)
GFR calc non Af Amer: 48 mL/min — ABNORMAL LOW (ref >60)
GLUCOSE: 135 mg/dL — AB (ref 65–99)
Potassium: 4.5 mmol/L (ref 3.5–5.1)
Sodium: 141 mmol/L (ref 135–145)

## 2018-03-30 LAB — TROPONIN I: Troponin I: <0.03 ng/mL (ref <0.03)

## 2018-03-30 MED ORDER — HEPARIN SODIUM (PORCINE) 5000 UNIT/ML IJ SOLN
5000.0000 [IU] | Freq: Three times a day (TID) | INTRAMUSCULAR | Status: DC
  Administered 2018-03-30 – 2018-03-31 (×2): 5000 [IU] via SUBCUTANEOUS
  Filled 2018-03-30 (×2): qty 1

## 2018-03-30 NOTE — Progress Notes (Signed)
Patient's heart rate remained mid 40s, B/P 70s/40s. Notified Dr. Arbie Cookey. Received order for dopamine drip. Gave patient 500 bolus per order. Patient's b/p came up some. Paged Dr. Arbie Cookey to notify him up the b/p and heart rate. Orders to start dopamine drip. If dopamine ineffective, then give another 500 bolus. Patient has responded to the dopamine gtt.    Patient also had three episodes of emesis. First episode was when the dopamine gtt was started. zofran given. Patient is resting at this time. Will continue to monitor.

## 2018-03-30 NOTE — Plan of Care (Signed)
  Problem: Health Behavior/Discharge Planning: Goal: Ability to manage health-related needs will improve Outcome: Progressing   Problem: Clinical Measurements: Goal: Will remain free from infection Outcome: Progressing   Problem: Elimination: Goal: Will not experience complications related to urinary retention Outcome: Progressing   Problem: Pain Managment: Goal: General experience of comfort will improve Outcome: Progressing   Problem: Education: Goal: Knowledge of General Education information will improve Outcome: Completed/Met   Problem: Elimination: Goal: Will not experience complications related to bowel motility Outcome: Completed/Met

## 2018-03-30 NOTE — Plan of Care (Signed)
Care plans reviewed and patient is progressing.  

## 2018-03-30 NOTE — Plan of Care (Signed)
  Problem: Health Behavior/Discharge Planning: Goal: Ability to manage health-related needs will improve Outcome: Progressing   Problem: Clinical Measurements: Goal: Will remain free from infection Outcome: Progressing Goal: Respiratory complications will improve Outcome: Progressing   Problem: Activity: Goal: Risk for activity intolerance will decrease Outcome: Progressing   Problem: Nutrition: Goal: Adequate nutrition will be maintained Outcome: Progressing   Problem: Skin Integrity: Goal: Risk for impaired skin integrity will decrease Outcome: Progressing

## 2018-03-30 NOTE — Progress Notes (Addendum)
  Progress Note    03/30/2018 7:21 AM 1 Day Post-Op  Subjective:  Says he is swallowing ok-throat is just dry.  Nausea better.  Afebrile HR 40's-90's Afib/NSR 70's-120's systolic 95% RA  Gtts:  Dopamine  Vitals:   03/29/18 2204 03/30/18 0354  BP: (!) 94/58 (!) 113/44  Pulse: (!) 31 (!) 56  Resp: (!) 21 16  Temp:  98.4 F (36.9 C)  SpO2: 95% 91%     Physical Exam: Neuro:  In tact; tongue is midline Lungs:  Non labored Incision:  Clean and dry without hematoma  CBC    Component Value Date/Time   WBC 17.2 (H) 03/30/2018 0654   RBC 4.02 (L) 03/30/2018 0654   HGB 12.4 (L) 03/30/2018 0654   HGB 14.4 05/02/2017 1443   HCT 38.6 (L) 03/30/2018 0654   HCT 42.5 05/02/2017 1443   PLT 206 03/30/2018 0654   PLT 246 05/02/2017 1443   MCV 96.0 03/30/2018 0654   MCV 92 05/02/2017 1443   MCH 30.8 03/30/2018 0654   MCHC 32.1 03/30/2018 0654   RDW 11.7 03/30/2018 0654   RDW 13.0 05/02/2017 1443   LYMPHSABS 1.7 05/02/2017 1443   EOSABS 0.2 05/02/2017 1443   BASOSABS 0.1 05/02/2017 1443    BMET Pending   Intake/Output Summary (Last 24 hours) at 03/30/2018 0721 Last data filed at 03/30/2018 0231 Gross per 24 hour  Intake 2534.06 ml  Output 300 ml  Net 2234.06 ml     Assessment/Plan:  This is a 77 y.o. male who is s/p right CEA 1 Day Post-Op  -pt neuro in tact and tongue is midline; no difficulty swallowing -he is requiring Dopamine for pressure-will try to wean this am -2 episodes of afib noted-now in NSR.  Continue to monitor.  Discussed with pt and he doesn't have a hx of afib and does not recall any episodes of palpitations except after his CABG in 1991.  Will d/w Dr. Arbie Cookey -leukocytosis-most likely related to peri-op period. -has not walked-only up to side of bed. -most likely will need another day.  Given he is in NSR now, will start SQ heparin for DVT prophylaxis this afternoon.   Doreatha Massed, PA-C [facility] [phone]  Addendum:  While seeing pt with Dr. Arbie Cookey, pt is in and out of afib on the monitor.  Will order stat EKG and will consult cardiology to see pt.  He is pt of Dr. Allyson Sabal.   Dopamine down to 2.70mcg.  Okay to wean off.  Doreatha Massed, [facility] 03/30/2018 10:49 AM  I have examined the patient, reviewed and agree with above.  Comfortable with mild incisional soreness.  Did have bradycardia immediately after surgery surgery yesterday.  Blood pressure was slightly low with this so began low dose of dopamine and this is being weaned.  Heart rate is in the 90s range now with stable blood pressure.  Is going in and out of sinus rhythm and atrial fibrillation.  Have discussed with cardiology, Dr. Jens Som who will evaluate today.  Possible discharge tomorrow if stable from cardiac standpoint  [name], MD 03/30/2018 11:55 AM

## 2018-03-30 NOTE — Consult Note (Signed)
Cardiology Consultation:   Patient ID: Kevin Gonzales; [id number]; [birth year]   Admit date: 03/29/2018 Date of Consult: 03/30/2018  Primary Care Provider: Patient, No Pcp Per Primary Cardiologist: Dr Allyson Sabal   Patient Profile:   Kevin Gonzales is a 77 y.o. male with a hx of coronary artery disease status post coronary artery bypass and graft, hypertension, hyperlipidemia who is being seen today for the evaluation of paroxysmal atrial fibrillation at the request of [name] MD.  History of Present Illness:   Patient underwent coronary artery bypass and graft in 1991 with a RIMA to his right coronary artery.  He had a drug-eluting stent placed to his proximal LAD in 2005.  Echocardiogram January 2018 showed normal LV function and moderate left atrial enlargement. Last cardiac catheterization June 2018 showed occluded right coronary artery, 90% proximal circumflex and RIMA to RCA patent.  Normal LV function.  The circumflex was felt to be too difficult to approach percutaneously and he has been treated medically.  Patient underwent carotid endarterectomy on May 17.  This morning he was noted to be mildly hypotensive and was placed on renal dose dopamine.  He was also noted to have 2 episodes of ? atrial fibrillation.  Cardiology now asked to evaluate.  Patient denies dyspnea on exertion, orthopnea, PND, pedal edema, chest pain, palpitations or syncope.  Past Medical History:  Diagnosis Date  . Abdominal obesity   . Alcohol abuse   . Arthritis   . Carotid bruit   . Chest pain   . Chondrodermatitis nodularis helicis, left   . Coronary artery disease   . GERD (gastroesophageal reflux disease)   . Hyperlipidemia   . Hypertension   . Myocardial infarction (HCC) 1991  . Thyroid disease   . Vitamin D deficiency     Past Surgical History:  Procedure Laterality Date  . CAROTID ENDARTERECTOMY Right 03/29/2018  . CAROTID STENT  2004  . CORONARY ARTERY BYPASS GRAFT     1 vessel  . LEFT  HEART CATH AND CORS/GRAFTS ANGIOGRAPHY N/A 05/10/2017   Procedure: Left Heart Cath and Cors/Grafts Angiography;  Surgeon: Runell Gess, MD;  Location: [facility] INVASIVE CV LAB;  Service: Cardiovascular;  Laterality: N/A;      Inpatient Medications: Scheduled Meds: . aspirin EC  81 mg Oral Daily  . atenolol  12.5 mg Oral QHS  . budesonide (PULMICORT) nebulizer solution  0.25 mg Nebulization BID  . docusate sodium  100 mg Oral Daily  . finasteride  5 mg Oral Daily  . heparin injection (subcutaneous)  5,000 Units Subcutaneous Q8H  . losartan  50 mg Oral Daily  . pantoprazole  40 mg Oral Daily  . pravastatin  10 mg Oral QHS  . [START ON 03/31/2018] Vitamin D (Ergocalciferol)  50,000 Units Oral Q Sun   Continuous Infusions: . sodium chloride 100 mL/hr at 03/29/18 1626  . DOPamine 5 mcg/kg/min (03/29/18 2345)  . magnesium sulfate 1 - 4 g bolus IVPB     PRN Meds: acetaminophen **OR** acetaminophen, alum & mag hydroxide-simeth, bisacodyl, guaiFENesin-dextromethorphan, hydrALAZINE, HYDROcodone-acetaminophen, labetalol, magnesium sulfate 1 - 4 g bolus IVPB, meclizine, metoprolol tartrate, morphine injection, nitroGLYCERIN, ondansetron, phenol, polyethylene glycol, potassium chloride  Allergies:    Allergies  Allergen Reactions  . No Known Allergies     Social History:   Social History   Socioeconomic History  . Marital status: Married    Spouse name: Not on file  . Number of children: Not on file  . Years of education:  Not on file  . Highest education level: Not on file  Occupational History  . Not on file  Social Needs  . Financial resource strain: Not on file  . Food insecurity:    Worry: Not on file    Inability: Not on file  . Transportation needs:    Medical: Not on file    Non-medical: Not on file  Tobacco Use  . Smoking status: Former Smoker    Types: Cigarettes    Last attempt to quit: 1991    Years since quitting: 28.3  . Smokeless tobacco: Never Used  Substance  and Sexual Activity  . Alcohol use: Not Currently  . Drug use: No  . Sexual activity: Never  Lifestyle  . Physical activity:    Days per week: Not on file    Minutes per session: Not on file  . Stress: Not on file  Relationships  . Social connections:    Talks on phone: Not on file    Gets together: Not on file    Attends religious service: Not on file    Active member of club or organization: Not on file    Attends meetings of clubs or organizations: Not on file    Relationship status: Not on file  . Intimate partner violence:    Fear of current or ex partner: Not on file    Emotionally abused: Not on file    Physically abused: Not on file    Forced sexual activity: Not on file  Other Topics Concern  . Not on file  Social History Narrative  . Not on file    Family History:    Family History  Problem Relation Age of Onset  . Clotting disorder Father   . Clotting disorder Sister      ROS:  Please see the history of present illness.  Back and knee pain but no significant incisional pain from carotid endarterectomy. All other ROS reviewed and negative.     Physical Exam/Data:   Vitals:   03/30/18 0354 03/30/18 0758 03/30/18 0836 03/30/18 1121  BP: (!) 113/44 (!) 136/55    Pulse: (!) 56 60    Resp: 16 18    Temp: 98.4 F (36.9 C) 97.8 F (36.6 C)  98.1 F (36.7 C)  TempSrc: Oral Oral  Oral  SpO2: 91% 96% 95%   Weight:      Height:        Intake/Output Summary (Last 24 hours) at 03/30/2018 1235 Last data filed at 03/30/2018 1122 Gross per 24 hour  Intake 1205.06 ml  Output 375 ml  Net 830.06 ml   Filed Weights   03/30/18 0200  Weight: 218 lb 11.1 oz (99.2 kg)   Body mass index is 33.25 kg/m.  General:  Obese, well developed, in no acute distress HEENT: normal Lymph: no adenopathy Neck: no JVD; s/p RCEA Endocrine:  No thryomegaly Vascular: No carotid bruits; FA pulses 2+ bilaterally without bruits  Cardiac:  normal S1, S2; RRR; 2/6 systolic murmur  LSB Lungs:  clear to auscultation bilaterally, no wheezing, rhonchi or rales  Abd: soft, nontender, no hepatomegaly  Ext: no edema Musculoskeletal:  No deformities, BUE and BLE strength normal and equal Skin: warm and dry  Neuro:  CNs 2-12 intact, no focal abnormalities noted Psych:  Normal affect   EKG:  The EKG was personally reviewed and demonstrates: Sinus rhythm with frequent PACs but no ST changes. Telemetry:  Telemetry was personally reviewed and demonstrates: Sinus rhythm with  frequent PACs.  No atrial fibrillation.  Laboratory Data:  Chemistry Recent Labs  Lab 03/26/18 1542 03/30/18 0654  NA 139 141  K 3.6 4.5  CL 105 107  CO2 28 26  GLUCOSE 135* 135*  BUN 17 28*  CREATININE 0.94 1.38*  CALCIUM 8.9 8.3*  GFRNONAA >60 48*  GFRAA >60 56*  ANIONGAP 6 8    Recent Labs  Lab 03/26/18 1542  PROT 6.2*  ALBUMIN 3.7  AST 19  ALT 14*  ALKPHOS 43  BILITOT 0.9   Hematology Recent Labs  Lab 03/26/18 1542 03/30/18 0654  WBC 7.2 17.2*  RBC 4.49 4.02*  HGB 13.8 12.4*  HCT 42.3 38.6*  MCV 94.2 96.0  MCH 30.7 30.8  MCHC 32.6 32.1  RDW 11.9 11.7  PLT 217 206    Assessment and Plan:   1. Question atrial fibrillation-I have reviewed the patient's telemetry.  This showed sinus rhythm with frequent PACs.  I can find no evidence of atrial fibrillation.  I will continue to monitor. 2. Hypotension-patient has some degree of postoperative hypotension.  This appears to be improving.  Wean dopamine to off.  Will check enzymes to be complete.  Patient denies chest pain.  I will hold his Cozaar and atenolol until blood pressure improves. 3. History of hypertension-as blood pressure improves will resume home medications. 4. Acute kidney disease-creatinine increased today.  Possibly secondary to transient hypotension and poor renal perfusion.  Recheck tomorrow morning. 5. Status post carotid endarterectomy-continue aspirin and statin. 6. Hyperlipidemia-continue statin.   For  questions or updates, please contact [facility] Please consult [email or web address] for contact info under Cardiology/STEMI.   Signed, Olga Millers, MD  03/30/2018 12:35 PM

## 2018-03-30 NOTE — Progress Notes (Signed)
Unable to draw labs via A-line. A-line d/c'd. Lab will draw the morning labs. Site clean and dry. Will continue to monitor

## 2018-03-31 DIAGNOSIS — I480 Paroxysmal atrial fibrillation: Secondary | ICD-10-CM

## 2018-03-31 LAB — BASIC METABOLIC PANEL
Anion gap: 7 (ref 5–15)
BUN: 24 mg/dL — AB (ref 6–20)
CO2: 25 mmol/L (ref 22–32)
Calcium: 7.8 mg/dL — ABNORMAL LOW (ref 8.9–10.3)
Chloride: 106 mmol/L (ref 101–111)
Creatinine, Ser: 0.96 mg/dL (ref 0.61–1.24)
GFR calc Af Amer: >60 mL/min (ref >60)
GFR calc non Af Amer: >60 mL/min (ref >60)
GLUCOSE: 109 mg/dL — AB (ref 65–99)
POTASSIUM: 4.1 mmol/L (ref 3.5–5.1)
Sodium: 138 mmol/L (ref 135–145)

## 2018-03-31 LAB — TROPONIN I: Troponin I: <0.03 ng/mL (ref <0.03)

## 2018-03-31 MED ORDER — APIXABAN 5 MG PO TABS
5.0000 mg | ORAL_TABLET | Freq: Two times a day (BID) | ORAL | Status: DC
  Administered 2018-03-31: 5 mg via ORAL
  Filled 2018-03-31: qty 1

## 2018-03-31 MED ORDER — APIXABAN 5 MG PO TABS: 5.0000 mg | ORAL_TABLET | Freq: Two times a day (BID) | ORAL | 2 refills | Status: DC

## 2018-03-31 MED ORDER — APIXABAN 5 MG PO TABS
5.0000 mg | ORAL_TABLET | Freq: Once | ORAL | Status: DC
  Filled 2018-03-31: qty 1

## 2018-03-31 MED ORDER — LOSARTAN POTASSIUM 50 MG PO TABS: 25.0000 mg | ORAL_TABLET | Freq: Every day | ORAL | Status: DC

## 2018-03-31 NOTE — Progress Notes (Signed)
Discharge instructions given to Mr. Drennon and his wife.  Discussed new medications and side effects, and changes to his existing medications.  Discussed follow up appointments and activities.  Discussed signs and symptoms to watch for and when to contact the physician.  Verbalized understanding.

## 2018-03-31 NOTE — Discharge Instructions (Signed)
[facility]  Discharge Instructions   Carotid Endarterectomy (CEA)  Please refer to the following instructions for your post-procedure care. Your surgeon or physician assistant will discuss any changes with you.  Activity  You are encouraged to walk as much as you can. You can slowly return to normal activities but must avoid strenuous activity and heavy lifting until your doctor tell you it's okay. Avoid activities such as vacuuming or swinging a golf club. You can drive after one week if you are comfortable and you are no longer taking prescription pain medications. It is normal to feel tired for serval weeks after your surgery. It is also normal to have difficulty with sleep habits, eating, and bowel movements after surgery. These will go away with time.  Bathing/Showering  Shower daily after you go home. Do not soak in a bathtub, hot tub, or swim until the incision heals completely.  Incision Care  Shower every day. Clean your incision with mild soap and water. Pat the area dry with a clean towel. You do not need a bandage unless otherwise instructed. Do not apply any ointments or creams to your incision. You may have skin glue on your incision. Do not peel it off. It will come off on its own in about one week. Your incision may feel thickened and raised for several weeks after your surgery. This is normal and the skin will soften over time.   For Men Only: It's okay to shave around the incision but do not shave the incision itself for 2 weeks. It is common to have numbness under your chin that could last for several months.  Diet  Resume your normal diet. There are no special food restrictions following this procedure. A low fat/low cholesterol diet is recommended for all patients with vascular disease. In order to heal from your surgery, it is CRITICAL to get adequate nutrition. Your body requires vitamins, minerals, and protein. Vegetables are the  best source of vitamins and minerals. Vegetables also provide the perfect balance of protein. Processed food has little nutritional value, so try to avoid this.  Medications  Resume taking all of your medications unless your doctor or physician assistant tells you not to. If your incision is causing pain, you may take over-the- counter pain relievers such as acetaminophen (Tylenol). If you were prescribed a stronger pain medication, please be aware these medications can cause nausea and constipation. Prevent nausea by taking the medication with a snack or meal. Avoid constipation by drinking plenty of fluids and eating foods with a high amount of fiber, such as fruits, vegetables, and grains.  Do not take Tylenol if you are taking prescription pain medications.  Follow Up  Our office will schedule a follow up appointment 2-3 weeks following discharge.  Please call us immediately for any of the following conditions   Increased pain, redness, drainage (pus) from your incision site.  Fever of 101 degrees or higher.  If you should develop stroke (slurred speech, difficulty swallowing, weakness on one side of your body, loss of vision) you should call 911 and go to the nearest emergency room.   Reduce your risk of vascular disease:   Stop smoking. If you would like help call [facility] at 1-800-QUIT-NOW (937-204-4149) or [place] at [phone].  Manage your cholesterol  Maintain a desired weight  Control your diabetes  Keep your blood pressure down   If you have any questions, please call the office at [phone].  Information  on my medicine - ELIQUIS (apixaban)   Why was Eliquis prescribed for you? Eliquis was prescribed for you to reduce the risk of forming blood clots that can cause a stroke if you have a medical condition called atrial fibrillation (a type of irregular heartbeat) OR to reduce the risk of a blood clots forming after orthopedic surgery.  What do  You need to know about Eliquis ? Take your Eliquis TWICE DAILY - one tablet in the morning and one tablet in the evening with or without food.  It would be best to take the doses about the same time each day.  If you have difficulty swallowing the tablet whole please discuss with your pharmacist how to take the medication safely.  Take Eliquis exactly as prescribed by your doctor and DO NOT stop taking Eliquis without talking to the doctor who prescribed the medication.  Stopping may increase your risk of developing a new clot or stroke.  Refill your prescription before you run out.  After discharge, you should have regular check-up appointments with your healthcare provider that is prescribing your Eliquis.  In the future your dose may need to be changed if your kidney function or weight changes by a significant amount or as you get older.  What do you do if you miss a dose? If you miss a dose, take it as soon as you remember on the same day and resume taking twice daily.  Do not take more than one dose of ELIQUIS at the same time.  Important Safety Information A possible side effect of Eliquis is bleeding. You should call your healthcare provider right away if you experience any of the following: ? Bleeding from an injury or your nose that does not stop. ? Unusual colored urine (red or dark brown) or unusual colored stools (red or black). ? Unusual bruising for unknown reasons. ? A serious fall or if you hit your head (even if there is no bleeding).  Some medicines may interact with Eliquis and might increase your risk of bleeding or clotting while on Eliquis. To help avoid this, consult your healthcare provider or pharmacist prior to using any new prescription or non-prescription medications, including herbals, vitamins, non-steroidal anti-inflammatory drugs (NSAIDs) and supplements.  This website has more information on Eliquis (apixaban): [email or web address].cy.

## 2018-03-31 NOTE — Progress Notes (Signed)
Progress Note  Patient Name: Kevin Gonzales Date of Encounter: 03/31/2018  Primary Cardiologist: Dr Allyson Sabal  Subjective   No chest pain or dyspnea  Inpatient Medications    Scheduled Meds: . aspirin EC  81 mg Oral Daily  . budesonide (PULMICORT) nebulizer solution  0.25 mg Nebulization BID  . docusate sodium  100 mg Oral Daily  . finasteride  5 mg Oral Daily  . heparin injection (subcutaneous)  5,000 Units Subcutaneous Q8H  . pantoprazole  40 mg Oral Daily  . pravastatin  10 mg Oral QHS  . Vitamin D (Ergocalciferol)  50,000 Units Oral Q Sun   Continuous Infusions: . sodium chloride Stopped (03/30/18 2200)  . DOPamine Stopped (03/30/18 1600)  . magnesium sulfate 1 - 4 g bolus IVPB     PRN Meds: acetaminophen **OR** acetaminophen, alum & mag hydroxide-simeth, bisacodyl, guaiFENesin-dextromethorphan, hydrALAZINE, HYDROcodone-acetaminophen, labetalol, magnesium sulfate 1 - 4 g bolus IVPB, meclizine, metoprolol tartrate, morphine injection, nitroGLYCERIN, ondansetron, phenol, polyethylene glycol, potassium chloride   Vital Signs    Vitals:   03/31/18 0038 03/31/18 0458 03/31/18 0921 03/31/18 0937  BP: (!) 119/54 (!) 157/78  117/88  Pulse: 93 90 82 83  Resp: 18 20 18 17   Temp:  98.5 F (36.9 C)  98 F (36.7 C)  TempSrc:  Oral  Oral  SpO2: 96% 94% 95% 95%  Weight:      Height:        Intake/Output Summary (Last 24 hours) at 03/31/2018 0947 Last data filed at 03/31/2018 0459 Gross per 24 hour  Intake 1728.33 ml  Output 1380 ml  Net 348.33 ml   Filed Weights   03/30/18 0200  Weight: 218 lb 11.1 oz (99.2 kg)    Telemetry    Sinus with PAF- Personally Reviewed  Physical Exam   GEN: No acute distress.   Neck: No JVD; s/p right CEA Cardiac: irregular Respiratory: Clear to auscultation bilaterally. GI: Soft, nontender, non-distended  MS: No edema Neuro:  Nonfocal  Psych: Normal affect   Labs    Chemistry Recent Labs  Lab 03/26/18 1542 03/30/18 0654  03/31/18 0750  NA 139 141 138  K 3.6 4.5 4.1  CL 105 107 106  CO2 28 26 25   GLUCOSE 135* 135* 109*  BUN 17 28* 24*  CREATININE 0.94 1.38* 0.96  CALCIUM 8.9 8.3* 7.8*  PROT 6.2*  --   --   ALBUMIN 3.7  --   --   AST 19  --   --   ALT 14*  --   --   ALKPHOS 43  --   --   BILITOT 0.9  --   --   GFRNONAA >60 48* >60  GFRAA >60 56* >60  ANIONGAP 6 8 7      Hematology Recent Labs  Lab 03/26/18 1542 03/30/18 0654  WBC 7.2 17.2*  RBC 4.49 4.02*  HGB 13.8 12.4*  HCT 42.3 38.6*  MCV 94.2 96.0  MCH 30.7 30.8  MCHC 32.6 32.1  RDW 11.9 11.7  PLT 217 206    Cardiac Enzymes Recent Labs  Lab 03/30/18 1302 03/30/18 1840 03/31/18 0032  TROPONINI <0.03 <0.03 <0.03    Patient Profile     77 y.o. male with past medical history of coronary artery disease status post coronary artery bypass graft, hypertension, hyperlipidemia, status post carotid endarterectomy with paroxysmal atrial fibrillation.  Assessment & Plan    1 Paroxysmal atrial fibrillation-telemetry has been personally reviewed.  Patient did have episodes of atrial  fibrillation over the past 12 hours.  He is not symptomatic with no chest pain, dyspnea or palpitations.  Would resume atenolol 12.5 mg daily which was preadmission dose.  CHADSvasc 3.  Add apixaban 5 mg twice daily.  Unclear if this is postoperative atrial fibrillation or whether he has intermittent atrial fibrillation at home as he is asymptomatic.  We will continue anticoagulation for at least 6 weeks and then follow-up in the office for further management.    2 Hypotension-resolved.  Would resume atenolol for atrial fibrillation as outlined.  Decrease preadmission dose of Cozaar to 25 mg daily.  Follow blood pressure as an outpatient and adjust regimen as needed.  3 Status post carotid endarterectomy-we will continue aspirin 81 mg daily and statin.  Patient can be discharged from a cardiac standpoint.  Follow-up with Dr. Allyson Sabal or APP in 4 to 6 weeks.  For  questions or updates, please contact [facility] Please consult [email or web address] for contact info under Cardiology/STEMI.      Signed, Olga Millers, MD  03/31/2018, 9:47 AM

## 2018-03-31 NOTE — Plan of Care (Signed)
Patient is adequate for discharge.  

## 2018-03-31 NOTE — Plan of Care (Signed)
Care plans reviewed and patient is progressing.  

## 2018-03-31 NOTE — Care Management (Signed)
Pt given Eliquis 30 day free card and Eliquis copay card.  Pt verbalizes understanding on how to use cards.  RN aware.

## 2018-03-31 NOTE — Progress Notes (Addendum)
  Progress Note    03/31/2018 7:27 AM 2 Days Post-Op  Subjective:  Says his throat is still dry but no trouble swallowing.  Got out of bed but didn't walk in the halls  Afebrile HR  50's-100's NSR 100's-150's systolic 95% RA  Gtts:  Dopamine off Vitals:   03/31/18 0038 03/31/18 0458  BP: (!) 119/54 (!) 157/78  Pulse: 93 90  Resp: 18 20  Temp:  98.5 F (36.9 C)  SpO2: 96% 94%     Physical Exam: Neuro:  In tact; tongue midline Lungs:  Non labored Incision:  Clean and dry without hematoma  CBC    Component Value Date/Time   WBC 17.2 (H) 03/30/2018 0654   RBC 4.02 (L) 03/30/2018 0654   HGB 12.4 (L) 03/30/2018 0654   HGB 14.4 05/02/2017 1443   HCT 38.6 (L) 03/30/2018 0654   HCT 42.5 05/02/2017 1443   PLT 206 03/30/2018 0654   PLT 246 05/02/2017 1443   MCV 96.0 03/30/2018 0654   MCV 92 05/02/2017 1443   MCH 30.8 03/30/2018 0654   MCHC 32.1 03/30/2018 0654   RDW 11.7 03/30/2018 0654   RDW 13.0 05/02/2017 1443   LYMPHSABS 1.7 05/02/2017 1443   EOSABS 0.2 05/02/2017 1443   BASOSABS 0.1 05/02/2017 1443    BMET    Component Value Date/Time   NA 141 03/30/2018 0654   NA 140 05/02/2017 1443   K 4.5 03/30/2018 0654   CL 107 03/30/2018 0654   CO2 26 03/30/2018 0654   GLUCOSE 135 (H) 03/30/2018 0654   BUN 28 (H) 03/30/2018 0654   BUN 16 05/02/2017 1443   CREATININE 1.38 (H) 03/30/2018 0654   CALCIUM 8.3 (L) 03/30/2018 0654   GFRNONAA 48 (L) 03/30/2018 0654   GFRAA 56 (L) 03/30/2018 0654     Intake/Output Summary (Last 24 hours) at 03/31/2018 0727 Last data filed at 03/31/2018 0459 Gross per 24 hour  Intake 1899.33 ml  Output 1555 ml  Net 344.33 ml     Assessment/Plan:  This is a 77 y.o. male who is s/p right CEA 2 Days Post-Op  -pt is doing well this am.  His dopamine was weaned off yesterday and he has tolerated this.   -appreciate cardiology assistance.  Troponin labs negative.  BMP ordered for this am.  EKG yesterday without afib. -creatinine  improved from yesterday -pt neuro exam is in tact -pt has ambulated in the room but not the hallway -pt has voided -f/u with Dr. Arbie Cookey in 2 weeks.   Doreatha Massed, PA-C [facility] [phone]  I have examined the patient, reviewed and agree with above.  [name], MD 03/31/2018 10:38 AM

## 2018-03-31 NOTE — Discharge Summary (Signed)
Discharge Summary     Kevin Gonzales [birth year] 77 y.o. male  [id number]  Admission Date: 03/29/2018  Discharge Date: 03/31/18  Physician: Larina Earthly, MD  Admission Diagnosis: RIGHT CAROTID STENOSIS   HPI:   This is a 77 y.o. male who presents with chief complaint: carotid artery stenosis.  He has been followed by his PCP for carotid artery stenosis with periodic carotid duplex exams over the years.  Last carotid duplex demonstrated 80 to 99% stenosis of right ICA.  Carotid duplex was repeated today in office which confirmed 80 to 99% stenosis of the right ICA and 40 to 59% stenosis of the left ICA.  Patient denies any history of TIA or stroke.  He also denies any strokelike symptoms including slurring speech, changes in vision, or one-sided weakness.  He is a former smoker who quit in 1991.  He is taking a daily aspirin and statin.  Past medical history is significant for CAD with history of CABG and subsequent PCI.  Most recent cardiac catheterization was in June 2018 which did not demonstrate any significant stenosis.  Patient states he walks stairs every day around his house and denies chest pain when doing so.  Also noted on carotid duplex was an occluded right ECA as well as a retrograde right vertebral artery.  Patient however denies drop attacks or right arm fatigue.  Hospital Course:  The patient was admitted to the hospital and taken to the operating room on 03/29/2018 and underwent right carotid endarterectomy.  The pt tolerated the procedure well and was transported to the PACU in good condition.   By POD 1, the pt neuro status was in tact with tongue midline.  He did require dopamine for pressure support.  It was documented that he had been in and out of afib through the night.  He was in sinus rhythm that morning.  He was started on heparin SQ and cardiology was consulted.   Pt was seen by cardiology and he could not find any evidence of afib.  Cardiac enzymes were  checked and normal x 3.  His ARB was held due to hypotension.    By POD 2, he was off dopamine.  When evaluated by cardiology, the pt was noted to be in afib and was started on Eliquis.  His neuro exam remained in tact.  He was discharged home that day with Eliquis and instructions to follow up with Dr. Allyson Sabal in 6 weeks and Dr. Arbie Cookey in 2-3 weeks.  The remainder of the hospital course consisted of increasing mobilization and increasing intake of solids without difficulty.   Recent Labs    03/30/18 0654 03/31/18 0750  NA 141 138  K 4.5 4.1  CL 107 106  CO2 26 25  GLUCOSE 135* 109*  BUN 28* 24*  CALCIUM 8.3* 7.8*   Recent Labs    03/30/18 0654  WBC 17.2*  HGB 12.4*  HCT 38.6*  PLT 206   No results for input(s): INR in the last 72 hours.     Discharge Diagnosis:  RIGHT CAROTID STENOSIS  Secondary Diagnosis: Patient Active Problem List   Diagnosis Date Noted  . Carotid artery stenosis, asymptomatic, right 03/29/2018  . Carotid artery stenosis 03/19/2018  . Chest pain   . Essential hypertension 05/02/2017  . GERD (gastroesophageal reflux disease) 04/30/2017  . History of infectious diarrhea 08/18/2016  . Acute myocardial infarction (HCC) 07/01/2014  . Allergic rhinitis 07/01/2014  . Benign prostatic hyperplasia 07/01/2014  . BPPV (benign  paroxysmal positional vertigo) 07/01/2014  . Coronary artery disease 07/01/2014  . Esophageal reflux 07/01/2014  . Hearing loss 07/01/2014  . Tinnitus of both ears 07/01/2014  . 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 Hypercholesterolemia 07/01/2014  . PSA elevation 04/29/2014  . Hypertrophy of prostate with urinary obstruction and other lower urinary tract symptoms (LUTS) 04/29/2014   Past Medical History:  Diagnosis Date  . Abdominal obesity   . Alcohol abuse   . Arthritis   . Carotid bruit   . Chest pain   . Chondrodermatitis nodularis helicis, left   . Coronary artery  disease   . GERD (gastroesophageal reflux disease)   . Hyperlipidemia   . Hypertension   . Myocardial infarction (HCC) 1991  . Thyroid disease   . Vitamin D deficiency     Allergies as of 03/31/2018      Reactions   No Known Allergies       Medication List    TAKE these medications   apixaban 5 MG Tabs tablet Commonly known as:  ELIQUIS Take 1 tablet (5 mg total) by mouth 2 (two) times daily.   aspirin EC 81 MG tablet Take 81 mg by mouth daily.   atenolol 25 MG tablet Commonly known as:  TENORMIN Take 12.5 mg by mouth at bedtime.   beclomethasone 80 MCG/ACT inhaler Commonly known as:  QVAR Inhale 1 puff into the lungs 2 (two) times daily as needed (for shortness of breath).   finasteride 5 MG tablet Commonly known as:  PROSCAR Take 5 mg by mouth daily.   FISH OIL ULTRA 1400 MG Caps Take 1,400 mg by mouth daily.   HYDROcodone-acetaminophen 7.5-325 MG tablet Commonly known as:  NORCO Take 1 tablet by mouth every 6 (six) hours as needed (for pain.).   losartan 50 MG tablet Commonly known as:  COZAAR Take 0.5 tablets (25 mg total) by mouth daily. What changed:  how much to take   meclizine 25 MG tablet Commonly known as:  ANTIVERT Take 25 mg by mouth 3 (three) times daily as needed for dizziness.   nitroGLYCERIN 0.4 MG SL tablet Commonly known as:  NITROSTAT Place 0.4 mg under the tongue every 5 (five) minutes as needed for chest pain.   omeprazole 20 MG capsule Commonly known as:  PRILOSEC Take 20 mg by mouth daily before breakfast.   pravastatin 10 MG tablet Commonly known as:  PRAVACHOL Take 10 mg by mouth at bedtime.   Vitamin D (Ergocalciferol) 50000 units Caps capsule Commonly known as:  DRISDOL Take 50,000 Units by mouth every Sunday.        [facility] Discharge Instructions Carotid Endarterectomy (CEA)  Please refer to the following instructions for your post-procedure care. Your surgeon or physician  assistant will discuss any changes with you.  Activity  You are encouraged to walk as much as you can. You can slowly return to normal activities but must avoid strenuous activity and heavy lifting until your doctor tell you it's OK. Avoid activities such as vacuuming or swinging a golf club. You can drive after one week if you are comfortable and you are no longer taking prescription pain medications. It is normal to feel tired for serval weeks after your surgery. It is also normal to have difficulty with sleep habits, eating, and bowel movements after surgery. These will go away with time.  Bathing/Showering  You may shower after you come home. Do not  soak in a bathtub, hot tub, or swim until the incision heals completely.  Incision Care  Shower every day. Clean your incision with mild soap and water. Pat the area dry with a clean towel. You do not need a bandage unless otherwise instructed. Do not apply any ointments or creams to your incision. You may have skin glue on your incision. Do not peel it off. It will come off on its own in about one week. Your incision may feel thickened and raised for several weeks after your surgery. This is normal and the skin will soften over time. For Men Only: It's OK to shave around the incision but do not shave the incision itself for 2 weeks. It is common to have numbness under your chin that could last for several months.  Diet  Resume your normal diet. There are no special food restrictions following this procedure. A low fat/low cholesterol diet is recommended for all patients with vascular disease. In order to heal from your surgery, it is CRITICAL to get adequate nutrition. Your body requires vitamins, minerals, and protein. Vegetables are the best source of vitamins and minerals. Vegetables also provide the perfect balance of protein. Processed food has little nutritional value, so try to avoid this.  Medications  Resume taking all of your  medications unless your doctor or physician assistant tells you not to.  If your incision is causing pain, you may take over-the- counter pain relievers such as acetaminophen (Tylenol). If you were prescribed a stronger pain medication, please be aware these medications can cause nausea and constipation.  Prevent nausea by taking the medication with a snack or meal. Avoid constipation by drinking plenty of fluids and eating foods with a high amount of fiber, such as fruits, vegetables, and grains. Do not take Tylenol if you are taking prescription pain medications.  His cozaar dose was cut to 25mg  daily.   Follow Up  Our office will schedule a follow up appointment 2-3 weeks following discharge.  Please call us immediately for any of the following conditions  . Increased pain, redness, drainage (pus) from your incision site. . Fever of 101 degrees or higher. . If you should develop stroke (slurred speech, difficulty swallowing, weakness on one side of your body, loss of vision) you should call 911 and go to the nearest emergency room. .  Reduce your risk of vascular disease:  . Stop smoking. If you would like help call [facility] at 1-800-QUIT-NOW (2233547809) or [facility] at 951-870-6891. . Manage your cholesterol . Maintain a desired weight . Control your diabetes . Keep your blood pressure down .  If you have any questions, please call the office at [phone].  Prescriptions given: Norco #8 No Refill Eliquis 5mg  bid #60 2RF (refills per cardiology)  Disposition: home  Patient's condition: is Good  Follow up: 1. Dr. Arbie Cookey in 2 weeks. 2. Dr. Allyson Sabal in 6 weeks for f/u PAF   Doreatha Massed, PA-C [facility] [phone]   --- For [facility] use ---   Modified Rankin score at D/C (0-6): 0  IV medication needed for:  1. Hypertension: No 2. Hypotension: Yes  Post-op Complications: No  1. Post-op CVA or TIA: No  If yes: Event classification  (right eye, left eye, right cortical, left cortical, verterobasilar, other): n/a  If yes: Timing of event (intra-op, <6 hrs post-op, >=6 hrs post-op, unknown): n/a  2. CN injury: No  If yes: CN n/a injuried   3. Myocardial infarction:  No  If yes: Dx by (EKG or clinical, Troponin): n/a  4.  CHF: No  5.  Dysrhythmia (new): Yes PAF  6. Wound infection: No  7. Reperfusion symptoms: No  8. Return to OR: No  If yes: return to OR for (bleeding, neurologic, other CEA incision, other): n/a  Discharge medications: Statin use:  Yes ASA use:  Yes   Beta blocker use:  Yes ACE-Inhibitor use:  No  ARB use:  Yes CCB use: No P2Y12 Antagonist use: No, [ ]  Plavix, [ ]  Plasugrel, [ ]  Ticlopinine, [ ]  Ticagrelor, [ ]  Other, [ ]  No for medical reason, [ ]  Non-compliant, [ ]  Not-indicated Anti-coagulant use:  Yes, [ ]  Warfarin, [ ]  Rivaroxaban, [ ]  Dabigatran,  [x]  Eliquis

## 2018-04-01 ENCOUNTER — Encounter (HOSPITAL_COMMUNITY): Payer: Self-pay | Admitting: Vascular Surgery

## 2018-04-09 ENCOUNTER — Ambulatory Visit (INDEPENDENT_AMBULATORY_CARE_PROVIDER_SITE_OTHER): Payer: Self-pay | Admitting: Vascular Surgery

## 2018-04-09 ENCOUNTER — Other Ambulatory Visit: Payer: Self-pay

## 2018-04-09 ENCOUNTER — Encounter: Payer: Self-pay | Admitting: Vascular Surgery

## 2018-04-09 VITALS — BP 125/69 | HR 83 | Temp 98.9°F | Resp 20 | Ht 68.0 in | Wt 218.6 lb

## 2018-04-09 DIAGNOSIS — I6523 Occlusion and stenosis of bilateral carotid arteries: Secondary | ICD-10-CM

## 2018-04-09 NOTE — Progress Notes (Signed)
   Patient name: Kevin Gonzales MRN: [id number] DOB: [birth year] Sex: male  REASON FOR VISIT: Follow-up right carotid endarterectomy for severe asymptomatic disease 03/29/2018  HPI: Kevin Gonzales is a 77 y.o. male here today for follow-up.  He is done well following the procedure.  He has had no neurologic deficits.  He is on chronic anticoagulation therapy.  He does have more than the usual amount of swelling in his right neck but no tracheal shift  Current Outpatient Medications  Medication Sig Dispense Refill  . apixaban (ELIQUIS) 5 MG TABS tablet Take 1 tablet (5 mg total) by mouth 2 (two) times daily. 60 tablet 2  . aspirin EC 81 MG tablet Take 81 mg by mouth daily.    Marland Kitchen atenolol (TENORMIN) 25 MG tablet Take 12.5 mg by mouth at bedtime.     . beclomethasone (QVAR) 80 MCG/ACT inhaler Inhale 1 puff into the lungs 2 (two) times daily as needed (for shortness of breath).     . finasteride (PROSCAR) 5 MG tablet Take 5 mg by mouth daily.    Marland Kitchen HYDROcodone-acetaminophen (NORCO) 7.5-325 MG tablet Take 1 tablet by mouth every 6 (six) hours as needed (for pain.). 8 tablet 0  . losartan (COZAAR) 50 MG tablet Take 0.5 tablets (25 mg total) by mouth daily.    . meclizine (ANTIVERT) 25 MG tablet Take 25 mg by mouth 3 (three) times daily as needed for dizziness.    . nitroGLYCERIN (NITROSTAT) 0.4 MG SL tablet Place 0.4 mg under the tongue every 5 (five) minutes as needed for chest pain.    . Omega-3 Fatty Acids (FISH OIL ULTRA) 1400 MG CAPS Take 1,400 mg by mouth daily.    Marland Kitchen omeprazole (PRILOSEC) 20 MG capsule Take 20 mg by mouth daily before breakfast.     . pravastatin (PRAVACHOL) 10 MG tablet Take 10 mg by mouth at bedtime.    . Vitamin D, Ergocalciferol, (DRISDOL) 50000 units CAPS capsule Take 50,000 Units by mouth every Sunday.      No current facility-administered medications for this visit.      PHYSICAL EXAM: Vitals:   04/09/18 1447 04/09/18 1451  BP:  134/77 125/69  Pulse: 83   Resp: 20   Temp: 98.9 F (37.2 C)   TempSrc: Oral   SpO2: 96%   Weight: 218 lb 9.6 oz (99.2 kg)   Height: 5\' 8"  (1.727 m)     GENERAL: The patient is a well-nourished male, in no acute distress. The vital signs are documented above. Incision is well-healed.  He does have fullness under the incision but no expansile nature and no tracheal shift  MEDICAL ISSUES: Stable status post carotid endarterectomy.  Will resume full activity without limitation.  We will see him again in 9 months with repeat carotid duplex.  He will notify should he have any wound problems or neurologic deficits   Larina Earthly, MD [facility] Tel [phone] Pager [phone]

## 2018-04-17 ENCOUNTER — Ambulatory Visit (INDEPENDENT_AMBULATORY_CARE_PROVIDER_SITE_OTHER): Payer: Medicare Other | Admitting: Cardiovascular Disease

## 2018-04-17 ENCOUNTER — Encounter: Payer: Self-pay | Admitting: Cardiovascular Disease

## 2018-04-17 VITALS — BP 132/78 | HR 83 | Ht 68.0 in | Wt 206.0 lb

## 2018-04-17 DIAGNOSIS — I2510 Atherosclerotic heart disease of native coronary artery without angina pectoris: Secondary | ICD-10-CM

## 2018-04-17 DIAGNOSIS — I6521 Occlusion and stenosis of right carotid artery: Secondary | ICD-10-CM | POA: Diagnosis not present

## 2018-04-17 DIAGNOSIS — I10 Essential (primary) hypertension: Secondary | ICD-10-CM

## 2018-04-17 DIAGNOSIS — I480 Paroxysmal atrial fibrillation: Secondary | ICD-10-CM

## 2018-04-17 DIAGNOSIS — I6523 Occlusion and stenosis of bilateral carotid arteries: Secondary | ICD-10-CM | POA: Diagnosis not present

## 2018-04-17 DIAGNOSIS — E7800 Pure hypercholesterolemia, unspecified: Secondary | ICD-10-CM

## 2018-04-17 DIAGNOSIS — I4891 Unspecified atrial fibrillation: Secondary | ICD-10-CM | POA: Diagnosis not present

## 2018-04-17 NOTE — Assessment & Plan Note (Signed)
History of essential hypertension her blood pressure measured at 132/78.  He is on atenolol and losartan.  Continue current meds at current dosing.

## 2018-04-17 NOTE — Assessment & Plan Note (Signed)
History of hyperlipidemia on statin therapy. 

## 2018-04-17 NOTE — Patient Instructions (Signed)
Medication Instructions: Your physician recommends that you continue on your current medications as directed. Please refer to the Current Medication list given to you today.   Testing/Procedures: Your physician has recommended that you wear a 30 day event monitor. Event monitors are medical devices that record the heart's electrical activity. Doctors most often us these monitors to diagnose arrhythmias. Arrhythmias are problems with the speed or rhythm of the heartbeat. The monitor is a small, portable device. You can wear one while you do your normal daily activities. This is usually used to diagnose what is causing palpitations/syncope (passing out).  Follow-Up: Your physician recommends that you schedule a follow-up appointment as needed with Dr. Berry.   

## 2018-04-17 NOTE — Assessment & Plan Note (Signed)
History of carotid artery disease status post elective right carotid endarterectomy by Dr. Tawanna Cooler Early 03/29/2018.

## 2018-04-17 NOTE — Assessment & Plan Note (Signed)
PAF noticed in the perioperative hours after endarterectomy.  He was begun on Eliquis oral anticoagulation and was on low-dose beta-blocker.  There is no prior history of PAF in the past.  I am going to get a 30-day event monitor to further evaluate silent episodes to decide whether or not to continue or discontinue his oral anticoagulation.

## 2018-04-17 NOTE — Assessment & Plan Note (Signed)
History of CAD status post coronary artery bypass graft grafting x1 in 1991 with a RIMA to the RCA by Dr. Arvilla Market at [facility].  I placed a 2.75 mm x 16 mm long Taxus drug-eluting stent in his proximal LAD 02/20/2004.  He had a normal echo 11/17/2016 and nonischemic Myoview 02/03/2016.  I performed cardiac catheterization on him 05/02/2017 revealing a patent RIMA to the RCA with a total native RCA, patent stent to the LAD with a high-grade ostial nondominant circumflex thought to be not favorable favorable for intervention and normal LV function.  He has done well since denying chest pain or shortness of breath., patent stent to the proximal

## 2018-04-17 NOTE — Progress Notes (Signed)
04/17/2018 Kevin Gonzales   [birth year]  [id number]  Primary Physician Patient, No Pcp Per Primary Cardiologist: Runell Gess MD Nicholes Calamity, [place]  HPI:  Kevin Gonzales is a 77 y.o.  mildly overweight married Caucasian male father of 2, grandfather one grandchild referred by Arnette Felts for cardiovascular evaluation and coronary angiography because of ongoing chest pain.  I last saw him in the office 05/02/2017 he has a history of treated hypertension, hyperlipidemia and remote tobacco abuse having quit in 1991. He had coronary artery bypass grafting times one in 1991 with a RIMA to his RCA by Dr. Arvilla Market at [facility]. I placed a 2.75 mm x 16 mm long Taxus drug-eluting stent in his proximal LAD 02/20/04.  I performed cardiac catheterization on him 05/10/2017 revealing normal LV function, a patent RIMA to the RCA with with an occluded native RCA, patent stent to the proximal LAD which I placed in 2005 and high-grade ostial nondominant circumflex.  Medical therapy was recommended.  He recently had elective right carotid endarterectomy performed by Dr. Tawanna Cooler Early 03/29/2018 and had episodes of PAF in the perioperative period.  He was seen by Dr. Jens Som who began him on Eliquis oral anticoagulation.     Current Meds  Medication Sig  . apixaban (ELIQUIS) 5 MG TABS tablet Take 1 tablet (5 mg total) by mouth 2 (two) times daily.  Marland Kitchen aspirin EC 81 MG tablet Take 81 mg by mouth daily.  Marland Kitchen atenolol (TENORMIN) 25 MG tablet Take 12.5 mg by mouth at bedtime.   . beclomethasone (QVAR) 80 MCG/ACT inhaler Inhale 1 puff into the lungs 2 (two) times daily as needed (for shortness of breath).   . finasteride (PROSCAR) 5 MG tablet Take 5 mg by mouth daily.  Marland Kitchen HYDROcodone-acetaminophen (NORCO) 7.5-325 MG tablet Take 1 tablet by mouth every 6 (six) hours as needed (for pain.).  Marland Kitchen losartan (COZAAR) 50 MG tablet Take 0.5 tablets (25 mg total) by mouth daily.  . meclizine (ANTIVERT) 25  MG tablet Take 25 mg by mouth 3 (three) times daily as needed for dizziness.  . nitroGLYCERIN (NITROSTAT) 0.4 MG SL tablet Place 0.4 mg under the tongue every 5 (five) minutes as needed for chest pain.  . Omega-3 Fatty Acids (FISH OIL ULTRA) 1400 MG CAPS Take 1,400 mg by mouth daily.  Marland Kitchen omeprazole (PRILOSEC) 20 MG capsule Take 20 mg by mouth daily before breakfast.   . pravastatin (PRAVACHOL) 10 MG tablet Take 10 mg by mouth at bedtime.  . Vitamin D, Ergocalciferol, (DRISDOL) 50000 units CAPS capsule Take 50,000 Units by mouth every Sunday.      Allergies  Allergen Reactions  . No Known Allergies     Social History   Socioeconomic History  . Marital status: Married    Spouse name: Not on file  . Number of children: Not on file  . Years of education: Not on file  . Highest education level: Not on file  Occupational History  . Not on file  Social Needs  . Financial resource strain: Not on file  . Food insecurity:    Worry: Not on file    Inability: Not on file  . Transportation needs:    Medical: Not on file    Non-medical: Not on file  Tobacco Use  . Smoking status: Former Smoker    Types: Cigarettes    Last attempt to quit: 1991    Years since quitting: 28.4  .  Smokeless tobacco: Never Used  Substance and Sexual Activity  . Alcohol use: Not Currently  . Drug use: No  . Sexual activity: Never  Lifestyle  . Physical activity:    Days per week: Not on file    Minutes per session: Not on file  . Stress: Not on file  Relationships  . Social connections:    Talks on phone: Not on file    Gets together: Not on file    Attends religious service: Not on file    Active member of club or organization: Not on file    Attends meetings of clubs or organizations: Not on file    Relationship status: Not on file  . Intimate partner violence:    Fear of current or ex partner: Not on file    Emotionally abused: Not on file    Physically abused: Not on file    Forced sexual  activity: Not on file  Other Topics Concern  . Not on file  Social History Narrative  . Not on file     Review of Systems: General: negative for chills, fever, night sweats or weight changes.  Cardiovascular: negative for chest pain, dyspnea on exertion, edema, orthopnea, palpitations, paroxysmal nocturnal dyspnea or shortness of breath Dermatological: negative for rash Respiratory: negative for cough or wheezing Urologic: negative for hematuria Abdominal: negative for nausea, vomiting, diarrhea, bright red blood per rectum, melena, or hematemesis Neurologic: negative for visual changes, syncope, or dizziness All other systems reviewed and are otherwise negative except as noted above.    Blood pressure 132/78, pulse 83, height 5\' 8"  (1.727 m), weight 206 lb (93.4 kg).  General appearance: alert and no distress Neck: no adenopathy, no carotid bruit, no JVD, supple, symmetrical, trachea midline and thyroid not enlarged, symmetric, no tenderness/mass/nodules Lungs: clear to auscultation bilaterally Heart: regular rate and rhythm, S1, S2 normal, no murmur, click, rub or gallop Extremities: extremities normal, atraumatic, no cyanosis or edema Pulses: 2+ and symmetric Skin: Skin color, texture, turgor normal. No rashes or lesions Neurologic: Alert and oriented X 3, normal strength and tone. Normal symmetric reflexes. Normal coordination and gait  EKG not performed today  ASSESSMENT AND PLAN:   Coronary artery disease History of CAD status post coronary artery bypass graft grafting x1 in 1991 with a RIMA to the RCA by Dr. Arvilla Market at [facility].  I placed a 2.75 mm x 16 mm long Taxus drug-eluting stent in his proximal LAD 02/20/2004.  He had a normal echo 11/17/2016 and nonischemic Myoview 02/03/2016.  I performed cardiac catheterization on him 05/02/2017 revealing a patent RIMA to the RCA with a total native RCA, patent stent to the LAD with a high-grade ostial  nondominant circumflex thought to be not favorable favorable for intervention and normal LV function.  He has done well since denying chest pain or shortness of breath., patent stent to the proximal  Hypercholesterolemia History of hyperlipidemia on statin therapy  Essential hypertension History of essential hypertension her blood pressure measured at 132/78.  He is on atenolol and losartan.  Continue current meds at current dosing.  Carotid artery stenosis, asymptomatic, right History of carotid artery disease status post elective right carotid endarterectomy by Dr. Tawanna Cooler Early 03/29/2018.      Runell Gess MD FACP,FACC,FAHA, [facility] 04/17/2018 2:53 PM

## 2018-04-23 ENCOUNTER — Telehealth: Payer: Self-pay | Admitting: Cardiovascular Disease

## 2018-04-23 NOTE — Telephone Encounter (Addendum)
Would recommend against holding anticoagulation for routine cleaning. Per our protocol pt does not meet criteria for antibiotic prophylaxis.

## 2018-04-23 NOTE — Telephone Encounter (Signed)
Faxing recs to requesting provider. Will remove from preop pool

## 2018-04-23 NOTE — Telephone Encounter (Signed)
NEW MESSAGE    1. What dental office are you calling from?  Dr Mohammed Kindle DDS  2. What is your office phone number? [phone]  3. What is your fax number? [phone] ATTN: Kim   4. What type of procedure is the patient having performed? Routine cleaning  5. What date is procedure scheduled or is the patient there now? 04/24/2018  6. What is your question (ex. Antibiotics prior to procedure, holding medication-we need to know how long dentist wants pt to hold med)? apixaban (ELIQUIS) 5 MG TABS tablet

## 2018-04-26 ENCOUNTER — Ambulatory Visit (INDEPENDENT_AMBULATORY_CARE_PROVIDER_SITE_OTHER): Payer: Medicare Other

## 2018-04-26 DIAGNOSIS — I4891 Unspecified atrial fibrillation: Secondary | ICD-10-CM

## 2018-05-07 ENCOUNTER — Telehealth: Payer: Self-pay

## 2018-05-07 NOTE — Telephone Encounter (Signed)
Received fax from [employer] that patient had AF with 3 second pause 05/07/18 @ 2:35am EDT while sleeping. Monitor ordered to assess of burden of PAF. Reviewed by Dr. Allyson Sabal who recommended no med changes, continue anticoagulation, no change in plans d/t nocturnal pause.

## 2018-05-07 NOTE — Telephone Encounter (Signed)
Opened in error

## 2018-06-28 ENCOUNTER — Other Ambulatory Visit: Payer: Self-pay | Admitting: Physician Assistant

## 2018-07-10 ENCOUNTER — Ambulatory Visit (INDEPENDENT_AMBULATORY_CARE_PROVIDER_SITE_OTHER): Payer: Medicare Other | Admitting: Cardiovascular Disease

## 2018-07-10 ENCOUNTER — Encounter: Payer: Self-pay | Admitting: Cardiovascular Disease

## 2018-07-10 DIAGNOSIS — I480 Paroxysmal atrial fibrillation: Secondary | ICD-10-CM | POA: Diagnosis not present

## 2018-07-10 DIAGNOSIS — I6523 Occlusion and stenosis of bilateral carotid arteries: Secondary | ICD-10-CM | POA: Diagnosis not present

## 2018-07-10 NOTE — Patient Instructions (Signed)
Medication Instructions:  Your physician recommends that you continue on your current medications as directed. Please refer to the Current Medication list given to you today.   Labwork: none  Testing/Procedures: Your physician has requested that you have an echocardiogram. Echocardiography is a painless test that uses sound waves to create images of your heart. It provides your doctor with information about the size and shape of your heart and how well your heart's chambers and valves are working. This procedure takes approximately one hour. There are no restrictions for this procedure.    Follow-Up: Your physician wants you to follow-up in: 6 months with Dr. Allyson Sabal. You will receive a reminder letter in the mail two months in advance. If you don't receive a letter, please call our office to schedule the follow-up appointment.  You have been referred to [facility].   Any Other Special Instructions Will Be Listed Below (If Applicable).     If you need a refill on your cardiac medications before your next appointment, please call your pharmacy.

## 2018-07-10 NOTE — Progress Notes (Signed)
07/10/2018 Kevin Gonzales   1941-04-30  [id number]  Primary Physician Patient, No Pcp Per Primary Cardiologist: Runell Gess MD Kevin Gonzales, [place]  HPI:  Kevin Gonzales is a 77 y.o.   mildly overweight married Caucasian male father of 2, grandfather one grandchild referred by Kevin Gonzales for cardiovascular evaluation and coronary angiography because of ongoing chest pain.  I last saw him in the office 04/17/2018 he has a history of treated hypertension, hyperlipidemia and remote tobacco abuse having quit in 1991. He had coronary artery bypass grafting times one in 1991 with a RIMA to his RCA by Dr. Arvilla Gonzales at [facility]. I placed a 2.75 mm x 16 mm long Taxus drug-eluting stent in his proximal LAD 02/20/04.  I performed cardiac catheterization on him 05/10/2017 revealing normal LV function, a patent RIMA to the RCA with with an occluded native RCA, patent stent to the proximal LAD which I placed in 2005 and high-grade ostial nondominant circumflex.  Medical therapy was recommended.  He recently had elective right carotid endarterectomy performed by Dr. Tawanna Cooler Gonzales 03/29/2018 and had episodes of PAF in the perioperative period.  He was seen by Dr. Jens Gonzales who began him on Eliquis oral anticoagulation.  Since I saw him 2 months ago an event monitor was placed that did show chronic A. fib with occasional pauses.  He remains on Eliquis.  He does complain of some chronic fatigue and dyspnea.  He denies chest pain.   Current Meds  Medication Sig  . apixaban (ELIQUIS) 5 MG TABS tablet Take 1 tablet (5 mg total) by mouth 2 (two) times daily.  Marland Kitchen aspirin EC 81 MG tablet Take 81 mg by mouth daily.  Marland Kitchen atenolol (TENORMIN) 25 MG tablet Take 12.5 mg by mouth at bedtime.   . beclomethasone (QVAR) 80 MCG/ACT inhaler Inhale 1 puff into the lungs 2 (two) times daily as needed (for shortness of breath).   . finasteride (PROSCAR) 5 MG tablet Take 5 mg by mouth daily.  Marland Kitchen  HYDROcodone-acetaminophen (NORCO) 7.5-325 MG tablet Take 1 tablet by mouth every 6 (six) hours as needed (for pain.).  Marland Kitchen losartan (COZAAR) 50 MG tablet Take 0.5 tablets (25 mg total) by mouth daily.  . meclizine (ANTIVERT) 25 MG tablet Take 25 mg by mouth 3 (three) times daily as needed for dizziness.  . nitroGLYCERIN (NITROSTAT) 0.4 MG SL tablet Place 0.4 mg under the tongue every 5 (five) minutes as needed for chest pain.  . Omega-3 Fatty Acids (FISH OIL ULTRA) 1400 MG CAPS Take 1,400 mg by mouth daily.  Marland Kitchen omeprazole (PRILOSEC) 20 MG capsule Take 20 mg by mouth daily before breakfast.   . pravastatin (PRAVACHOL) 10 MG tablet Take 10 mg by mouth at bedtime.  . Vitamin D, Ergocalciferol, (DRISDOL) 50000 units CAPS capsule Take 50,000 Units by mouth every Sunday.      Allergies  Allergen Reactions  . No Known Allergies     Social History   Socioeconomic History  . Marital status: Married    Spouse name: Not on file  . Number of children: Not on file  . Years of education: Not on file  . Highest education level: Not on file  Occupational History  . Not on file  Social Needs  . Financial resource strain: Not on file  . Food insecurity:    Worry: Not on file    Inability: Not on file  . Transportation needs:    Medical:  Not on file    Non-medical: Not on file  Tobacco Use  . Smoking status: Former Smoker    Types: Cigarettes    Last attempt to quit: 1991    Years since quitting: 28.6  . Smokeless tobacco: Never Used  Substance and Sexual Activity  . Alcohol use: Not Currently  . Drug use: No  . Sexual activity: Never  Lifestyle  . Physical activity:    Days per week: Not on file    Minutes per session: Not on file  . Stress: Not on file  Relationships  . Social connections:    Talks on phone: Not on file    Gets together: Not on file    Attends religious service: Not on file    Active member of club or organization: Not on file    Attends meetings of clubs or  organizations: Not on file    Relationship status: Not on file  . Intimate partner violence:    Fear of current or ex partner: Not on file    Emotionally abused: Not on file    Physically abused: Not on file    Forced sexual activity: Not on file  Other Topics Concern  . Not on file  Social History Narrative  . Not on file     Review of Systems: General: negative for chills, fever, night sweats or weight changes.  Cardiovascular: negative for chest pain, dyspnea on exertion, edema, orthopnea, palpitations, paroxysmal nocturnal dyspnea or shortness of breath Dermatological: negative for rash Respiratory: negative for cough or wheezing Urologic: negative for hematuria Abdominal: negative for nausea, vomiting, diarrhea, bright red blood per rectum, melena, or hematemesis Neurologic: negative for visual changes, syncope, or dizziness All other systems reviewed and are otherwise negative except as noted above.    Blood pressure (!) 149/72, pulse (!) 59, height 5\' 8"  (1.727 m), weight 225 lb (102.1 kg).  General appearance: alert and no distress Neck: no adenopathy, no carotid bruit, no JVD, supple, symmetrical, trachea midline and thyroid not enlarged, symmetric, no tenderness/mass/nodules Lungs: clear to auscultation bilaterally Heart: irregularly irregular rhythm Extremities: extremities normal, atraumatic, no cyanosis or edema Pulses: 2+ and symmetric Skin: Skin color, texture, turgor normal. No rashes or lesions Neurologic: Alert and oriented X 3, normal strength and tone. Normal symmetric reflexes. Normal coordination and gait  EKG atrial fibrillation with a ventricular response of 64.  ASSESSMENT AND PLAN:   Paroxysmal atrial fibrillation [employer] [name] returns today for follow-up.  I just saw him 2 months ago.  He wore an event monitor that showed A. fib with occasional pauses.  Principally at night.  He is on Eliquis oral and coagulation.  He complains of chronic  fatigue and dyspnea.  I think he would benefit from being evaluated by the A. fib clinic with consideration of antiarrhythmic therapy plus or minus outpatient DC cardioversion.      Runell Gess MD FACP,FACC,FAHA, [facility] 07/10/2018 10:38 AM

## 2018-07-10 NOTE — Assessment & Plan Note (Signed)
Kevin Gonzales returns today for follow-up.  I just saw him 2 months ago.  He wore an event monitor that showed A. fib with occasional pauses.  Principally at night.  He is on Eliquis oral and coagulation.  He complains of chronic fatigue and dyspnea.  I think he would benefit from being evaluated by the A. fib clinic with consideration of antiarrhythmic therapy plus or minus outpatient DC cardioversion.

## 2018-07-12 ENCOUNTER — Ambulatory Visit (HOSPITAL_COMMUNITY): Payer: Medicare Other | Attending: Cardiology

## 2018-07-12 ENCOUNTER — Other Ambulatory Visit: Payer: Self-pay

## 2018-07-12 ENCOUNTER — Encounter (HOSPITAL_COMMUNITY): Payer: Self-pay | Admitting: Radiology

## 2018-07-12 DIAGNOSIS — E669 Obesity, unspecified: Secondary | ICD-10-CM | POA: Insufficient documentation

## 2018-07-12 DIAGNOSIS — I480 Paroxysmal atrial fibrillation: Secondary | ICD-10-CM | POA: Insufficient documentation

## 2018-07-12 DIAGNOSIS — E785 Hyperlipidemia, unspecified: Secondary | ICD-10-CM | POA: Diagnosis not present

## 2018-07-12 DIAGNOSIS — I10 Essential (primary) hypertension: Secondary | ICD-10-CM | POA: Diagnosis not present

## 2018-07-12 DIAGNOSIS — Z6834 Body mass index (BMI) 34.0-34.9, adult: Secondary | ICD-10-CM | POA: Diagnosis not present

## 2018-07-12 DIAGNOSIS — Z951 Presence of aortocoronary bypass graft: Secondary | ICD-10-CM | POA: Insufficient documentation

## 2018-07-12 DIAGNOSIS — I2510 Atherosclerotic heart disease of native coronary artery without angina pectoris: Secondary | ICD-10-CM | POA: Diagnosis not present

## 2018-07-12 MED ORDER — PERFLUTREN LIPID MICROSPHERE
1.0000 mL | INTRAVENOUS | Status: AC | PRN
  Administered 2018-07-12: 2 mL via INTRAVENOUS

## 2018-07-12 NOTE — Progress Notes (Signed)
Patient was given 2 cc of Definity during echocardiogram. Patient developed severe lower back pain within 2 or 3 minutes of infusion. Patient's IV was flushed with 20 cc of normal saline. DOD, Dr. Johney Frame examined the patient. The pain gradually eased. The patient was monitored for 20 minutes after given saline. Patient was discharged in usual state of health.

## 2018-07-19 ENCOUNTER — Encounter (HOSPITAL_COMMUNITY): Payer: Self-pay | Admitting: Nurse Practitioner

## 2018-07-19 ENCOUNTER — Ambulatory Visit (HOSPITAL_COMMUNITY)
Admission: RE | Admit: 2018-07-19 | Discharge: 2018-07-19 | Disposition: A | Discharge status: Home / Self Care | Payer: Medicare Other | Source: Ambulatory Visit | Attending: Nurse Practitioner | Admitting: Nurse Practitioner

## 2018-07-19 VITALS — BP 136/72 | HR 67 | Ht 68.0 in | Wt 219.0 lb

## 2018-07-19 DIAGNOSIS — I4819 Other persistent atrial fibrillation: Secondary | ICD-10-CM

## 2018-07-19 DIAGNOSIS — Z951 Presence of aortocoronary bypass graft: Secondary | ICD-10-CM | POA: Insufficient documentation

## 2018-07-19 DIAGNOSIS — I481 Persistent atrial fibrillation: Secondary | ICD-10-CM

## 2018-07-19 DIAGNOSIS — I2510 Atherosclerotic heart disease of native coronary artery without angina pectoris: Secondary | ICD-10-CM | POA: Insufficient documentation

## 2018-07-19 DIAGNOSIS — E785 Hyperlipidemia, unspecified: Secondary | ICD-10-CM | POA: Insufficient documentation

## 2018-07-19 DIAGNOSIS — Z9889 Other specified postprocedural states: Secondary | ICD-10-CM | POA: Diagnosis not present

## 2018-07-19 DIAGNOSIS — I10 Essential (primary) hypertension: Secondary | ICD-10-CM | POA: Insufficient documentation

## 2018-07-19 DIAGNOSIS — M1990 Unspecified osteoarthritis, unspecified site: Secondary | ICD-10-CM | POA: Insufficient documentation

## 2018-07-19 DIAGNOSIS — K219 Gastro-esophageal reflux disease without esophagitis: Secondary | ICD-10-CM | POA: Insufficient documentation

## 2018-07-19 DIAGNOSIS — I4891 Unspecified atrial fibrillation: Secondary | ICD-10-CM | POA: Diagnosis present

## 2018-07-19 DIAGNOSIS — Z7982 Long term (current) use of aspirin: Secondary | ICD-10-CM | POA: Insufficient documentation

## 2018-07-19 DIAGNOSIS — Z79891 Long term (current) use of opiate analgesic: Secondary | ICD-10-CM | POA: Insufficient documentation

## 2018-07-19 DIAGNOSIS — Z7901 Long term (current) use of anticoagulants: Secondary | ICD-10-CM | POA: Insufficient documentation

## 2018-07-19 DIAGNOSIS — E079 Disorder of thyroid, unspecified: Secondary | ICD-10-CM | POA: Insufficient documentation

## 2018-07-19 DIAGNOSIS — I252 Old myocardial infarction: Secondary | ICD-10-CM | POA: Insufficient documentation

## 2018-07-19 DIAGNOSIS — Z87891 Personal history of nicotine dependence: Secondary | ICD-10-CM | POA: Insufficient documentation

## 2018-07-19 DIAGNOSIS — Z79899 Other long term (current) drug therapy: Secondary | ICD-10-CM | POA: Diagnosis not present

## 2018-07-19 DIAGNOSIS — Z832 Family history of diseases of the blood and blood-forming organs and certain disorders involving the immune mechanism: Secondary | ICD-10-CM | POA: Insufficient documentation

## 2018-07-19 DIAGNOSIS — E559 Vitamin D deficiency, unspecified: Secondary | ICD-10-CM | POA: Diagnosis not present

## 2018-07-19 LAB — CBC
HEMATOCRIT: 42.5 % (ref 39.0–52.0)
HEMOGLOBIN: 13.8 g/dL (ref 13.0–17.0)
MCH: 31.1 pg (ref 26.0–34.0)
MCHC: 32.5 g/dL (ref 30.0–36.0)
MCV: 95.7 fL (ref 78.0–100.0)
Platelets: 206 10*3/uL (ref 150–400)
RBC: 4.44 MIL/uL (ref 4.22–5.81)
RDW: 11.9 % (ref 11.5–15.5)
WBC: 6 10*3/uL (ref 4.0–10.5)

## 2018-07-19 LAB — BASIC METABOLIC PANEL
Anion gap: 10 (ref 5–15)
BUN: 23 mg/dL (ref 8–23)
CHLORIDE: 105 mmol/L (ref 98–111)
CO2: 27 mmol/L (ref 22–32)
CREATININE: 1.26 mg/dL — AB (ref 0.61–1.24)
Calcium: 9.3 mg/dL (ref 8.9–10.3)
GFR calc non Af Amer: 54 mL/min — ABNORMAL LOW (ref >60)
Glucose, Bld: 110 mg/dL — ABNORMAL HIGH (ref 70–99)
Potassium: 4.1 mmol/L (ref 3.5–5.1)
Sodium: 142 mmol/L (ref 135–145)

## 2018-07-19 LAB — TSH: TSH: 1.914 u[IU]/mL (ref 0.350–4.500)

## 2018-07-19 NOTE — H&P (View-Only) (Signed)
Primary Care Physician: Patient, No Pcp Per Referring Physician: Dr. Morrie Sheldon is a 77 y.o. male with a h/o CAD S/p CABG, HTN, that is being evaluated in the afib clinic for evaluation of new onset afib. Per pt this was found at the time of his rt endarterectomy  03/29/18. He has been  rate controlled since  then. He was placed on anticoagulation in  May.Echo in August showed EF at 60-65%. HE ahs noted some fatigue since his surgery.  Today, he denies symptoms of palpitations, chest pain, shortness of breath, orthopnea, PND, lower extremity edema, dizziness, presyncope, syncope, or neurologic sequela. + for fatigue. The patient is tolerating medications without difficulties and is otherwise without complaint today.   Past Medical History:  Diagnosis Date  . Abdominal obesity   . Alcohol abuse   . Arthritis   . Carotid artery occlusion   . Carotid bruit   . Chest pain   . Chondrodermatitis nodularis helicis, left   . Coronary artery disease   . GERD (gastroesophageal reflux disease)   . Hyperlipidemia   . Hypertension   . Myocardial infarction (HCC) 1991  . Thyroid disease   . Vitamin D deficiency    Past Surgical History:  Procedure Laterality Date  . CAROTID ENDARTERECTOMY Right 03/29/2018  . CAROTID STENT  2004  . CORONARY ARTERY BYPASS GRAFT     1 vessel  . ENDARTERECTOMY Right 03/29/2018   Procedure: Right Carotid Endartarectomy;  Surgeon: Larina Earthly, MD;  Location: [facility] OR;  Service: Vascular;  Laterality: Right;  . LEFT HEART CATH AND CORS/GRAFTS ANGIOGRAPHY N/A 05/10/2017   Procedure: Left Heart Cath and Cors/Grafts Angiography;  Surgeon: Runell Gess, MD;  Location: [facility] INVASIVE CV LAB;  Service: Cardiovascular;  Laterality: N/A;  . PATCH ANGIOPLASTY Right 03/29/2018   Procedure: PATCH ANGIOPLASTY;  Surgeon: Larina Earthly, MD;  Location: [facility] OR;  Service: Vascular;  Laterality: Right;    Current Outpatient Medications  Medication Sig Dispense Refill    . apixaban (ELIQUIS) 5 MG TABS tablet Take 1 tablet (5 mg total) by mouth 2 (two) times daily. 60 tablet 2  . aspirin EC 81 MG tablet Take 81 mg by mouth daily.    Marland Kitchen atenolol (TENORMIN) 25 MG tablet Take 12.5 mg by mouth at bedtime.     . beclomethasone (QVAR) 80 MCG/ACT inhaler Inhale 1 puff into the lungs 2 (two) times daily as needed (for shortness of breath).     . finasteride (PROSCAR) 5 MG tablet Take 5 mg by mouth daily.    Marland Kitchen HYDROcodone-acetaminophen (NORCO) 7.5-325 MG tablet Take 1 tablet by mouth every 6 (six) hours as needed (for pain.). 8 tablet 0  . losartan (COZAAR) 50 MG tablet Take 0.5 tablets (25 mg total) by mouth daily.    . meclizine (ANTIVERT) 25 MG tablet Take 25 mg by mouth 3 (three) times daily as needed for dizziness.    . nitroGLYCERIN (NITROSTAT) 0.4 MG SL tablet Place 0.4 mg under the tongue every 5 (five) minutes as needed for chest pain.    . Omega-3 Fatty Acids (FISH OIL ULTRA) 1400 MG CAPS Take 1,400 mg by mouth daily.    Marland Kitchen omeprazole (PRILOSEC) 20 MG capsule Take 20 mg by mouth daily before breakfast.     . pravastatin (PRAVACHOL) 10 MG tablet Take 10 mg by mouth at bedtime.    . Vitamin D, Ergocalciferol, (DRISDOL) 50000 units CAPS capsule Take 50,000 Units by mouth every  Sunday.      No current facility-administered medications for this encounter.     Allergies  Allergen Reactions  . Perflutren Lipid Microsphere Other (See Comments)    Severe lower back, tailbone, and butt pain. Minor chest pain  . No Known Allergies     Social History   Socioeconomic History  . Marital status: Married    Spouse name: Not on file  . Number of children: Not on file  . Years of education: Not on file  . Highest education level: Not on file  Occupational History  . Not on file  Social Needs  . Financial resource strain: Not on file  . Food insecurity:    Worry: Not on file    Inability: Not on file  . Transportation needs:    Medical: Not on file     Non-medical: Not on file  Tobacco Use  . Smoking status: Former Smoker    Types: Cigarettes    Last attempt to quit: 1991    Years since quitting: 28.6  . Smokeless tobacco: Never Used  Substance and Sexual Activity  . Alcohol use: Not Currently  . Drug use: No  . Sexual activity: Never  Lifestyle  . Physical activity:    Days per week: Not on file    Minutes per session: Not on file  . Stress: Not on file  Relationships  . Social connections:    Talks on phone: Not on file    Gets together: Not on file    Attends religious service: Not on file    Active member of club or organization: Not on file    Attends meetings of clubs or organizations: Not on file    Relationship status: Not on file  . Intimate partner violence:    Fear of current or ex partner: Not on file    Emotionally abused: Not on file    Physically abused: Not on file    Forced sexual activity: Not on file  Other Topics Concern  . Not on file  Social History Narrative  . Not on file    Family History  Problem Relation Age of Onset  . Clotting disorder Father   . Clotting disorder Sister     ROS- All systems are reviewed and negative except as per the HPI above  Physical Exam: Vitals:   07/19/18 0917  BP: 136/72  Pulse: 67  Weight: 99.3 kg  Height: 5\' 8"  (1.727 m)   Wt Readings from Last 3 Encounters:  07/19/18 99.3 kg  07/10/18 102.1 kg  04/17/18 93.4 kg    Labs: Lab Results  Component Value Date   NA 142 07/19/2018   K 4.1 07/19/2018   CL 105 07/19/2018   CO2 27 07/19/2018   GLUCOSE 110 (H) 07/19/2018   BUN 23 07/19/2018   CREATININE 1.26 (H) 07/19/2018   CALCIUM 9.3 07/19/2018   Lab Results  Component Value Date   INR 1.07 03/26/2018   No results found for: CHOL, HDL, LDLCALC, TRIG   GEN- The patient is well appearing, alert and oriented x 3 today.   Head- normocephalic, atraumatic Eyes-  Sclera clear, conjunctiva pink Ears- hearing intact Oropharynx- clear Neck-  supple, no JVP Lymph- no cervical lymphadenopathy Lungs- Clear to ausculation bilaterally, normal work of breathing Heart-irregular rate and rhythm, no murmurs, rubs or gallops, PMI not laterally displaced GI- soft, NT, ND, + BS Extremities- no clubbing, cyanosis, or edema MS- no significant deformity or atrophy Skin- no rash  or lesion Psych- euthymic mood, full affect Neuro- strength and sensation are intact  EKG- afib at 67 bpm Echo-Study Conclusions  - Left ventricle: The cavity size was normal. There was mild   concentric hypertrophy. Systolic function was normal. The   estimated ejection fraction was in the range of 60% to 65%. Wall   motion was normal; there were no regional wall motion   abnormalities. - Aortic valve: Moderately to severely calcified annulus.   Trileaflet; mildly thickened, mildly calcified leaflets. - Mitral valve: Calcified annulus. - Left atrium: The atrium was mildly dilated. - Tricuspid valve: There was mild regurgitation. - Pulmonary arteries: Systolic pressure could not be accurately   estimated.    Assessment and Plan: 1. New onset afib May 2019 General  education re afib He is rate controlled Discussion with pt restoring SR  We discussed pursuing cardioversion alone, vrs start of antiarrythmic, with cardioversion He would like to try to avoid antiarrythmic's at this point and try cardioversion alone Discussed risk and benefit of DCCV  2. CHA2DS2VASc score of  4 He will continue eliquis 5 mg bid , he states no missed doses for at least 3 weeks  3. CAD No ischemic symptoms  4. HTN Stable   F/u in afib clinic 1 week after cardioversion  Lupita Leash C. Matthew Folks [facility] [address] [place], [place] [address] [phone]

## 2018-07-19 NOTE — Patient Instructions (Addendum)
Cardioversion scheduled for Thursday, September 12th  - Arrive at the [facility] and go to admitting at 12:30PM  -Do not eat or drink anything after midnight the night prior to your procedure.  - Take all your morning medication with a sip of water prior to arrival.  - You will not be able to drive home after your procedure.  - Do not miss any doses of your Eliquis

## 2018-07-19 NOTE — Progress Notes (Signed)
 Primary Care Physician: Patient, No Pcp Per Referring Physician: Dr. Berry   Kevin Gonzales is a 77 y.o. male with a h/o CAD S/p CABG, HTN, that is being evaluated in the afib clinic for evaluation of new onset afib. Per pt this was found at the time of his rt endarterectomy  03/29/18. He has been  rate controlled since  then. He was placed on anticoagulation in  May.Echo in August showed EF at 60-65%. HE ahs noted some fatigue since his surgery.  Today, he denies symptoms of palpitations, chest pain, shortness of breath, orthopnea, PND, lower extremity edema, dizziness, presyncope, syncope, or neurologic sequela. + for fatigue. The patient is tolerating medications without difficulties and is otherwise without complaint today.   Past Medical History:  Diagnosis Date  . Abdominal obesity   . Alcohol abuse   . Arthritis   . Carotid artery occlusion   . Carotid bruit   . Chest pain   . Chondrodermatitis nodularis helicis, left   . Coronary artery disease   . GERD (gastroesophageal reflux disease)   . Hyperlipidemia   . Hypertension   . Myocardial infarction (HCC) 1991  . Thyroid disease   . Vitamin D deficiency    Past Surgical History:  Procedure Laterality Date  . CAROTID ENDARTERECTOMY Right 03/29/2018  . CAROTID STENT  2004  . CORONARY ARTERY BYPASS GRAFT     1 vessel  . ENDARTERECTOMY Right 03/29/2018   Procedure: Right Carotid Endartarectomy;  Surgeon: Early, Todd F, MD;  Location: [facility] OR;  Service: Vascular;  Laterality: Right;  . LEFT HEART CATH AND CORS/GRAFTS ANGIOGRAPHY N/A 05/10/2017   Procedure: Left Heart Cath and Cors/Grafts Angiography;  Surgeon: Berry, Jonathan J, MD;  Location: [facility] INVASIVE CV LAB;  Service: Cardiovascular;  Laterality: N/A;  . PATCH ANGIOPLASTY Right 03/29/2018   Procedure: PATCH ANGIOPLASTY;  Surgeon: Early, Todd F, MD;  Location: [facility] OR;  Service: Vascular;  Laterality: Right;    Current Outpatient Medications  Medication Sig Dispense Refill    . apixaban (ELIQUIS) 5 MG TABS tablet Take 1 tablet (5 mg total) by mouth 2 (two) times daily. 60 tablet 2  . aspirin EC 81 MG tablet Take 81 mg by mouth daily.    . atenolol (TENORMIN) 25 MG tablet Take 12.5 mg by mouth at bedtime.     . beclomethasone (QVAR) 80 MCG/ACT inhaler Inhale 1 puff into the lungs 2 (two) times daily as needed (for shortness of breath).     . finasteride (PROSCAR) 5 MG tablet Take 5 mg by mouth daily.    . HYDROcodone-acetaminophen (NORCO) 7.5-325 MG tablet Take 1 tablet by mouth every 6 (six) hours as needed (for pain.). 8 tablet 0  . losartan (COZAAR) 50 MG tablet Take 0.5 tablets (25 mg total) by mouth daily.    . meclizine (ANTIVERT) 25 MG tablet Take 25 mg by mouth 3 (three) times daily as needed for dizziness.    . nitroGLYCERIN (NITROSTAT) 0.4 MG SL tablet Place 0.4 mg under the tongue every 5 (five) minutes as needed for chest pain.    . Omega-3 Fatty Acids (FISH OIL ULTRA) 1400 MG CAPS Take 1,400 mg by mouth daily.    . omeprazole (PRILOSEC) 20 MG capsule Take 20 mg by mouth daily before breakfast.     . pravastatin (PRAVACHOL) 10 MG tablet Take 10 mg by mouth at bedtime.    . Vitamin D, Ergocalciferol, (DRISDOL) 50000 units CAPS capsule Take 50,000 Units by mouth every   Sunday.      No current facility-administered medications for this encounter.     Allergies  Allergen Reactions  . Perflutren Lipid Microsphere Other (See Comments)    Severe lower back, tailbone, and butt pain. Minor chest pain  . No Known Allergies     Social History   Socioeconomic History  . Marital status: Married    Spouse name: Not on file  . Number of children: Not on file  . Years of education: Not on file  . Highest education level: Not on file  Occupational History  . Not on file  Social Needs  . Financial resource strain: Not on file  . Food insecurity:    Worry: Not on file    Inability: Not on file  . Transportation needs:    Medical: Not on file     Non-medical: Not on file  Tobacco Use  . Smoking status: Former Smoker    Types: Cigarettes    Last attempt to quit: 1991    Years since quitting: 28.6  . Smokeless tobacco: Never Used  Substance and Sexual Activity  . Alcohol use: Not Currently  . Drug use: No  . Sexual activity: Never  Lifestyle  . Physical activity:    Days per week: Not on file    Minutes per session: Not on file  . Stress: Not on file  Relationships  . Social connections:    Talks on phone: Not on file    Gets together: Not on file    Attends religious service: Not on file    Active member of club or organization: Not on file    Attends meetings of clubs or organizations: Not on file    Relationship status: Not on file  . Intimate partner violence:    Fear of current or ex partner: Not on file    Emotionally abused: Not on file    Physically abused: Not on file    Forced sexual activity: Not on file  Other Topics Concern  . Not on file  Social History Narrative  . Not on file    Family History  Problem Relation Age of Onset  . Clotting disorder Father   . Clotting disorder Sister     ROS- All systems are reviewed and negative except as per the HPI above  Physical Exam: Vitals:   07/19/18 0917  BP: 136/72  Pulse: 67  Weight: 99.3 kg  Height: 5' 8" (1.727 m)   Wt Readings from Last 3 Encounters:  07/19/18 99.3 kg  07/10/18 102.1 kg  04/17/18 93.4 kg    Labs: Lab Results  Component Value Date   NA 142 07/19/2018   K 4.1 07/19/2018   CL 105 07/19/2018   CO2 27 07/19/2018   GLUCOSE 110 (H) 07/19/2018   BUN 23 07/19/2018   CREATININE 1.26 (H) 07/19/2018   CALCIUM 9.3 07/19/2018   Lab Results  Component Value Date   INR 1.07 03/26/2018   No results found for: CHOL, HDL, LDLCALC, TRIG   GEN- The patient is well appearing, alert and oriented x 3 today.   Head- normocephalic, atraumatic Eyes-  Sclera clear, conjunctiva pink Ears- hearing intact Oropharynx- clear Neck-  supple, no JVP Lymph- no cervical lymphadenopathy Lungs- Clear to ausculation bilaterally, normal work of breathing Heart-irregular rate and rhythm, no murmurs, rubs or gallops, PMI not laterally displaced GI- soft, NT, ND, + BS Extremities- no clubbing, cyanosis, or edema MS- no significant deformity or atrophy Skin- no rash   or lesion Psych- euthymic mood, full affect Neuro- strength and sensation are intact  EKG- afib at 67 bpm Echo-Study Conclusions  - Left ventricle: The cavity size was normal. There was mild   concentric hypertrophy. Systolic function was normal. The   estimated ejection fraction was in the range of 60% to 65%. Wall   motion was normal; there were no regional wall motion   abnormalities. - Aortic valve: Moderately to severely calcified annulus.   Trileaflet; mildly thickened, mildly calcified leaflets. - Mitral valve: Calcified annulus. - Left atrium: The atrium was mildly dilated. - Tricuspid valve: There was mild regurgitation. - Pulmonary arteries: Systolic pressure could not be accurately   estimated.    Assessment and Plan: 1. New onset afib May 2019 General  education re afib He is rate controlled Discussion with pt restoring SR  We discussed pursuing cardioversion alone, vrs start of antiarrythmic, with cardioversion He would like to try to avoid antiarrythmic's at this point and try cardioversion alone Discussed risk and benefit of DCCV  2. CHA2DS2VASc score of  4 He will continue eliquis 5 mg bid , he states no missed doses for at least 3 weeks  3. CAD No ischemic symptoms  4. HTN Stable   F/u in afib clinic 1 week after cardioversion  Donna C. Carroll, ANP-C [facility] [address] [place], [place] [address] [phone]   

## 2018-07-25 ENCOUNTER — Encounter (HOSPITAL_COMMUNITY)
Admission: RE | Disposition: A | Discharge status: Home / Self Care | Payer: Self-pay | Source: Ambulatory Visit | Attending: Cardiovascular Disease

## 2018-07-25 ENCOUNTER — Encounter (HOSPITAL_COMMUNITY): Payer: Self-pay | Admitting: Cardiovascular Disease

## 2018-07-25 ENCOUNTER — Ambulatory Visit (HOSPITAL_COMMUNITY)
Admission: RE | Admit: 2018-07-25 | Discharge: 2018-07-25 | Disposition: A | Discharge status: Home / Self Care | Payer: Medicare Other | Source: Ambulatory Visit | Attending: Cardiovascular Disease | Admitting: Cardiovascular Disease

## 2018-07-25 ENCOUNTER — Ambulatory Visit (HOSPITAL_COMMUNITY): Payer: Medicare Other | Admitting: Anesthesiology

## 2018-07-25 ENCOUNTER — Other Ambulatory Visit: Payer: Self-pay

## 2018-07-25 DIAGNOSIS — I252 Old myocardial infarction: Secondary | ICD-10-CM | POA: Diagnosis not present

## 2018-07-25 DIAGNOSIS — K219 Gastro-esophageal reflux disease without esophagitis: Secondary | ICD-10-CM | POA: Diagnosis not present

## 2018-07-25 DIAGNOSIS — Z79899 Other long term (current) drug therapy: Secondary | ICD-10-CM | POA: Diagnosis not present

## 2018-07-25 DIAGNOSIS — Z87891 Personal history of nicotine dependence: Secondary | ICD-10-CM | POA: Insufficient documentation

## 2018-07-25 DIAGNOSIS — I4891 Unspecified atrial fibrillation: Secondary | ICD-10-CM | POA: Insufficient documentation

## 2018-07-25 DIAGNOSIS — M1990 Unspecified osteoarthritis, unspecified site: Secondary | ICD-10-CM | POA: Diagnosis not present

## 2018-07-25 DIAGNOSIS — I4819 Other persistent atrial fibrillation: Secondary | ICD-10-CM

## 2018-07-25 DIAGNOSIS — I481 Persistent atrial fibrillation: Secondary | ICD-10-CM | POA: Diagnosis not present

## 2018-07-25 DIAGNOSIS — N529 Male erectile dysfunction, unspecified: Secondary | ICD-10-CM | POA: Insufficient documentation

## 2018-07-25 DIAGNOSIS — Z888 Allergy status to other drugs, medicaments and biological substances status: Secondary | ICD-10-CM | POA: Insufficient documentation

## 2018-07-25 DIAGNOSIS — E079 Disorder of thyroid, unspecified: Secondary | ICD-10-CM | POA: Diagnosis not present

## 2018-07-25 DIAGNOSIS — N400 Enlarged prostate without lower urinary tract symptoms: Secondary | ICD-10-CM | POA: Insufficient documentation

## 2018-07-25 DIAGNOSIS — H9190 Unspecified hearing loss, unspecified ear: Secondary | ICD-10-CM | POA: Insufficient documentation

## 2018-07-25 DIAGNOSIS — E785 Hyperlipidemia, unspecified: Secondary | ICD-10-CM | POA: Insufficient documentation

## 2018-07-25 DIAGNOSIS — E559 Vitamin D deficiency, unspecified: Secondary | ICD-10-CM | POA: Insufficient documentation

## 2018-07-25 DIAGNOSIS — I2510 Atherosclerotic heart disease of native coronary artery without angina pectoris: Secondary | ICD-10-CM | POA: Diagnosis not present

## 2018-07-25 DIAGNOSIS — Z7901 Long term (current) use of anticoagulants: Secondary | ICD-10-CM | POA: Diagnosis not present

## 2018-07-25 DIAGNOSIS — I6529 Occlusion and stenosis of unspecified carotid artery: Secondary | ICD-10-CM | POA: Diagnosis not present

## 2018-07-25 DIAGNOSIS — Z951 Presence of aortocoronary bypass graft: Secondary | ICD-10-CM | POA: Diagnosis not present

## 2018-07-25 DIAGNOSIS — I10 Essential (primary) hypertension: Secondary | ICD-10-CM | POA: Diagnosis not present

## 2018-07-25 DIAGNOSIS — H8110 Benign paroxysmal vertigo, unspecified ear: Secondary | ICD-10-CM | POA: Diagnosis not present

## 2018-07-25 DIAGNOSIS — Z7982 Long term (current) use of aspirin: Secondary | ICD-10-CM | POA: Diagnosis not present

## 2018-07-25 DIAGNOSIS — Z832 Family history of diseases of the blood and blood-forming organs and certain disorders involving the immune mechanism: Secondary | ICD-10-CM | POA: Diagnosis not present

## 2018-07-25 DIAGNOSIS — Z9889 Other specified postprocedural states: Secondary | ICD-10-CM | POA: Diagnosis not present

## 2018-07-25 HISTORY — PX: CARDIOVERSION: SHX1299

## 2018-07-25 SURGERY — CARDIOVERSION
Anesthesia: General

## 2018-07-25 MED ORDER — SODIUM CHLORIDE 0.9 % IV SOLN
INTRAVENOUS | Status: DC
  Administered 2018-07-25: 12:00:00 via INTRAVENOUS
  Administered 2018-07-25: 500 mL via INTRAVENOUS

## 2018-07-25 MED ORDER — PROPOFOL 10 MG/ML IV BOLUS
INTRAVENOUS | Status: DC | PRN
  Administered 2018-07-25: 50 mg via INTRAVENOUS

## 2018-07-25 MED ORDER — LIDOCAINE HCL (CARDIAC) PF 100 MG/5ML IV SOSY
PREFILLED_SYRINGE | INTRAVENOUS | Status: DC | PRN
  Administered 2018-07-25: 20 mg via INTRAVENOUS

## 2018-07-25 NOTE — Op Note (Signed)
Procedure: Electrical Cardioversion Indications:  Atrial Fibrillation  Procedure Details:  Consent: Risks of procedure as well as the alternatives and risks of each were explained to the (patient/caregiver).  Consent for procedure obtained.  Time Out: Verified patient identification, verified procedure, site/side was marked, verified correct patient position, special equipment/implants available, medications/allergies/relevent history reviewed, required imaging and test results available.  Performed  Patient placed on cardiac monitor, pulse oximetry, supplemental oxygen as necessary.  Sedation given: Dr. Sampson Goon, 50 mg IV propofol Pacer pads placed anterior and posterior chest.  Cardioverted 1 time(s).  Cardioversion with synchronized biphasic 150J shock.  Evaluation: Findings: Post procedure EKG shows: NSR Complications: None Patient did tolerate procedure well.  Time Spent Directly with the Patient:  30 minutes   Tomekia Helton 07/25/2018, 12:25 PM

## 2018-07-25 NOTE — Transfer of Care (Signed)
Immediate Anesthesia Transfer of Care Note  Patient: Kevin Gonzales  Procedure(s) Performed: CARDIOVERSION (N/A )  Patient Location: Endoscopy Unit  Anesthesia Type:General  Level of Consciousness: awake, alert  and oriented  Airway & Oxygen Therapy: Patient Spontanous Breathing and Patient connected to nasal cannula oxygen  Post-op Assessment: Report given to RN and Post -op Vital signs reviewed and stable  Post vital signs: Reviewed and stable  Last Vitals:  Vitals Value Taken Time  BP    Temp    Pulse    Resp    SpO2      Last Pain:  Vitals:   07/25/18 1208  TempSrc: Oral  PainSc: 0-No pain         Complications: No apparent anesthesia complications

## 2018-07-25 NOTE — Interval H&P Note (Signed)
History and Physical Interval Note:  07/25/2018 11:56 AM  Kevin Gonzales  has presented today for surgery, with the diagnosis of AFIB  The various methods of treatment have been discussed with the patient and family. After consideration of risks, benefits and other options for treatment, the patient has consented to  Procedure(s): CARDIOVERSION (N/A) as a surgical intervention .  The patient's history has been reviewed, patient examined, no change in status, stable for surgery.  I have reviewed the patient's chart and labs.  Questions were answered to the patient's satisfaction.     Orma Cheetham

## 2018-07-25 NOTE — Interval H&P Note (Signed)
History and Physical Interval Note:  07/25/2018 12:18 PM  Kevin Gonzales  has presented today for surgery, with the diagnosis of AFIB  The various methods of treatment have been discussed with the patient and family. After consideration of risks, benefits and other options for treatment, the patient has consented to  Procedure(s): CARDIOVERSION (N/A) as a surgical intervention .  The patient's history has been reviewed, patient examined, no change in status, stable for surgery.  I have reviewed the patient's chart and labs.  Questions were answered to the patient's satisfaction.     [name]

## 2018-07-25 NOTE — Anesthesia Preprocedure Evaluation (Signed)
Anesthesia Evaluation  Patient identified by MRN, date of birth, ID band Patient awake    Reviewed: Allergy & Precautions, NPO status , Patient's Chart, lab work & pertinent test results  Airway Mallampati: II  TM Distance: >3 FB Neck ROM: Full    Dental   Pulmonary former smoker,    breath sounds clear to auscultation       Cardiovascular hypertension, Pt. on medications + CAD, + Past MI and + Peripheral Vascular Disease  + dysrhythmias Atrial Fibrillation  Rhythm:Irregular Rate:Normal     Neuro/Psych negative neurological ROS     GI/Hepatic Neg liver ROS, GERD  ,  Endo/Other  negative endocrine ROS  Renal/GU negative Renal ROS     Musculoskeletal  (+) Arthritis ,   Abdominal   Peds  Hematology negative hematology ROS (+)   Anesthesia Other Findings   Reproductive/Obstetrics                             Anesthesia Physical Anesthesia Plan  ASA: III  Anesthesia Plan: General   Post-op Pain Management:    Induction: Intravenous  PONV Risk Score and Plan: 2 and Treatment may vary due to age or medical condition and Propofol infusion  Airway Management Planned: Mask and Natural Airway  Additional Equipment:   Intra-op Plan:   Post-operative Plan:   Informed Consent: I have reviewed the patients History and Physical, chart, labs and discussed the procedure including the risks, benefits and alternatives for the proposed anesthesia with the patient or authorized representative who has indicated his/her understanding and acceptance.     Plan Discussed with:   Anesthesia Plan Comments:         Anesthesia Quick Evaluation

## 2018-07-25 NOTE — Anesthesia Procedure Notes (Signed)
Procedure Name: MAC Date/Time: 07/25/2018 12:25 PM Performed by: Teressa Lower., CRNA Pre-anesthesia Checklist: Patient identified, Emergency Drugs available, Suction available, Patient being monitored and Timeout performed Patient Re-evaluated:Patient Re-evaluated prior to induction Oxygen Delivery Method: Nasal cannula

## 2018-07-25 NOTE — Discharge Instructions (Signed)
Electrical Cardioversion, Care After °This sheet gives you information about how to care for yourself after your procedure. Your health care provider may also give you more specific instructions. If you have problems or questions, contact your health care provider. °What can I expect after the procedure? °After the procedure, it is common to have: °· Some redness on the skin where the shocks were given. ° °Follow these instructions at home: °· Do not drive for 24 hours if you were given a medicine to help you relax (sedative). °· Take over-the-counter and prescription medicines only as told by your health care provider. °· Ask your health care provider how to check your pulse. Check it often. °· Rest for 48 hours after the procedure or as told by your health care provider. °· Avoid or limit your caffeine use as told by your health care provider. °Contact a health care provider if: °· You feel like your heart is beating too quickly or your pulse is not regular. °· You have a serious muscle cramp that does not go away. °Get help right away if: °· You have discomfort in your chest. °· You are dizzy or you feel faint. °· You have trouble breathing or you are short of breath. °· Your speech is slurred. °· You have trouble moving an arm or leg on one side of your body. °· Your fingers or toes turn cold or blue. °This information is not intended to replace advice given to you by your health care provider. Make sure you discuss any questions you have with your health care provider. °Document Released: 08/20/2013 Document Revised: 06/02/2016 Document Reviewed: 05/05/2016 °Elsevier Interactive Patient Education © 2018 [employer] ° °

## 2018-07-25 NOTE — Anesthesia Postprocedure Evaluation (Signed)
Anesthesia Post Note  Patient: Kevin Gonzales  Procedure(s) Performed: CARDIOVERSION (N/A )     Patient location during evaluation: PACU Anesthesia Type: General Level of consciousness: awake and alert Pain management: pain level controlled Vital Signs Assessment: post-procedure vital signs reviewed and stable Respiratory status: spontaneous breathing, nonlabored ventilation, respiratory function stable and patient connected to nasal cannula oxygen Cardiovascular status: blood pressure returned to baseline and stable Postop Assessment: no apparent nausea or vomiting Anesthetic complications: no    Last Vitals:  Vitals:   07/25/18 1230 07/25/18 1239  BP: (!) 143/66 (!) 154/77  Pulse: 67 79  Resp: 17 19  Temp:    SpO2: 99% 96%    Last Pain:  Vitals:   07/25/18 1239  TempSrc:   PainSc: 0-No pain                 Kennieth Rad

## 2018-08-01 ENCOUNTER — Ambulatory Visit (HOSPITAL_COMMUNITY)
Admission: RE | Admit: 2018-08-01 | Discharge: 2018-08-01 | Disposition: A | Discharge status: Home / Self Care | Payer: Medicare Other | Source: Ambulatory Visit | Attending: Nurse Practitioner | Admitting: Nurse Practitioner

## 2018-08-01 ENCOUNTER — Encounter (HOSPITAL_COMMUNITY): Payer: Self-pay | Admitting: Nurse Practitioner

## 2018-08-01 VITALS — BP 122/78 | HR 65 | Ht 68.0 in | Wt 221.8 lb

## 2018-08-01 DIAGNOSIS — Z7982 Long term (current) use of aspirin: Secondary | ICD-10-CM | POA: Insufficient documentation

## 2018-08-01 DIAGNOSIS — Z87891 Personal history of nicotine dependence: Secondary | ICD-10-CM | POA: Diagnosis not present

## 2018-08-01 DIAGNOSIS — I481 Persistent atrial fibrillation: Secondary | ICD-10-CM | POA: Diagnosis not present

## 2018-08-01 DIAGNOSIS — I4891 Unspecified atrial fibrillation: Secondary | ICD-10-CM | POA: Insufficient documentation

## 2018-08-01 DIAGNOSIS — I2510 Atherosclerotic heart disease of native coronary artery without angina pectoris: Secondary | ICD-10-CM | POA: Diagnosis not present

## 2018-08-01 DIAGNOSIS — Z79899 Other long term (current) drug therapy: Secondary | ICD-10-CM | POA: Diagnosis not present

## 2018-08-01 DIAGNOSIS — Z951 Presence of aortocoronary bypass graft: Secondary | ICD-10-CM | POA: Diagnosis not present

## 2018-08-01 DIAGNOSIS — Z9889 Other specified postprocedural states: Secondary | ICD-10-CM | POA: Insufficient documentation

## 2018-08-01 DIAGNOSIS — E785 Hyperlipidemia, unspecified: Secondary | ICD-10-CM | POA: Diagnosis not present

## 2018-08-01 DIAGNOSIS — I10 Essential (primary) hypertension: Secondary | ICD-10-CM | POA: Diagnosis not present

## 2018-08-01 DIAGNOSIS — I252 Old myocardial infarction: Secondary | ICD-10-CM | POA: Diagnosis not present

## 2018-08-01 DIAGNOSIS — K219 Gastro-esophageal reflux disease without esophagitis: Secondary | ICD-10-CM | POA: Insufficient documentation

## 2018-08-01 DIAGNOSIS — Z7901 Long term (current) use of anticoagulants: Secondary | ICD-10-CM | POA: Insufficient documentation

## 2018-08-01 DIAGNOSIS — I4819 Other persistent atrial fibrillation: Secondary | ICD-10-CM

## 2018-08-01 NOTE — Progress Notes (Signed)
Primary Care Physician: Karle Plumber, MD Referring Physician: Dr. Morrie Sheldon is a 77 y.o. male with a h/o CAD S/p CABG, HTN, that is being evaluated in the afib clinic for evaluation of new onset afib. Per pt this was found at the time of his rt endarterectomy  03/29/18. He has been  rate controlled since  then. He was placed on anticoagulation in  May.Echo in August showed EF at 60-65%. He has noted some fatigue since his surgery.  F/u in afib clinic,9/19, he opted to go for cardioversion alone and was successful. However, it returned within 24 hours, but for those 24 hours he felt much better. He is now willing to consider antiarrythmic's.  Today, he denies symptoms of palpitations, chest pain, shortness of breath, orthopnea, PND, lower extremity edema, dizziness, presyncope, syncope, or neurologic sequela. + for fatigue. The patient is tolerating medications without difficulties and is otherwise without complaint today.   Past Medical History:  Diagnosis Date  . Abdominal obesity   . Alcohol abuse   . Arthritis   . Carotid artery occlusion   . Carotid bruit   . Chest pain   . Chondrodermatitis nodularis helicis, left   . Coronary artery disease   . GERD (gastroesophageal reflux disease)   . Hyperlipidemia   . Hypertension   . Myocardial infarction (HCC) 1991  . Thyroid disease   . Vitamin D deficiency    Past Surgical History:  Procedure Laterality Date  . CARDIOVERSION N/A 07/25/2018   Procedure: CARDIOVERSION;  Surgeon: Thurmon Fair, MD;  Location: [facility] ENDOSCOPY;  Service: Cardiovascular;  Laterality: N/A;  . CAROTID ENDARTERECTOMY Right 03/29/2018  . CAROTID STENT  2004  . CORONARY ARTERY BYPASS GRAFT     1 vessel  . ENDARTERECTOMY Right 03/29/2018   Procedure: Right Carotid Endartarectomy;  Surgeon: Larina Earthly, MD;  Location: [facility] OR;  Service: Vascular;  Laterality: Right;  . LEFT HEART CATH AND CORS/GRAFTS ANGIOGRAPHY N/A 05/10/2017   Procedure:  Left Heart Cath and Cors/Grafts Angiography;  Surgeon: Runell Gess, MD;  Location: [facility] INVASIVE CV LAB;  Service: Cardiovascular;  Laterality: N/A;  . PATCH ANGIOPLASTY Right 03/29/2018   Procedure: PATCH ANGIOPLASTY;  Surgeon: Larina Earthly, MD;  Location: [facility] OR;  Service: Vascular;  Laterality: Right;    Current Outpatient Medications  Medication Sig Dispense Refill  . apixaban (ELIQUIS) 5 MG TABS tablet Take 1 tablet (5 mg total) by mouth 2 (two) times daily. 60 tablet 2  . aspirin EC 81 MG tablet Take 81 mg by mouth daily.    Marland Kitchen atenolol (TENORMIN) 25 MG tablet Take 12.5 mg by mouth at bedtime.     . beclomethasone (QVAR) 80 MCG/ACT inhaler Inhale 1 puff into the lungs 2 (two) times daily as needed (for shortness of breath).     . finasteride (PROSCAR) 5 MG tablet Take 5 mg by mouth daily.    Marland Kitchen HYDROcodone-acetaminophen (NORCO) 7.5-325 MG tablet Take 1 tablet by mouth every 6 (six) hours as needed (for pain.). 8 tablet 0  . ibuprofen (ADVIL,MOTRIN) 200 MG tablet Take 200 mg by mouth daily as needed for moderate pain.    Marland Kitchen losartan (COZAAR) 50 MG tablet Take 0.5 tablets (25 mg total) by mouth daily. (Patient taking differently: Take 50 mg by mouth daily. )    . meclizine (ANTIVERT) 25 MG tablet Take 25 mg by mouth 3 (three) times daily as needed for dizziness.    . nitroGLYCERIN (NITROSTAT)  0.4 MG SL tablet Place 0.4 mg under the tongue every 5 (five) minutes as needed for chest pain.    . Omega-3 Fatty Acids (FISH OIL ULTRA) 1400 MG CAPS Take 1,400 mg by mouth daily.    Marland Kitchen omeprazole (PRILOSEC) 20 MG capsule Take 20 mg by mouth daily before breakfast.     . pravastatin (PRAVACHOL) 10 MG tablet Take 10 mg by mouth at bedtime.    . Vitamin D, Ergocalciferol, (DRISDOL) 50000 units CAPS capsule Take 50,000 Units by mouth every Sunday.      No current facility-administered medications for this encounter.     Allergies  Allergen Reactions  . Perflutren Lipid Microsphere Other (See  Comments)    Severe lower back, tailbone, and butt pain. Minor chest pain    Social History   Socioeconomic History  . Marital status: Married    Spouse name: Not on file  . Number of children: Not on file  . Years of education: Not on file  . Highest education level: Not on file  Occupational History  . Not on file  Social Needs  . Financial resource strain: Not on file  . Food insecurity:    Worry: Not on file    Inability: Not on file  . Transportation needs:    Medical: Not on file    Non-medical: Not on file  Tobacco Use  . Smoking status: Former Smoker    Types: Cigarettes    Last attempt to quit: 1991    Years since quitting: 28.7  . Smokeless tobacco: Never Used  Substance and Sexual Activity  . Alcohol use: Not Currently  . Drug use: No  . Sexual activity: Never  Lifestyle  . Physical activity:    Days per week: Not on file    Minutes per session: Not on file  . Stress: Not on file  Relationships  . Social connections:    Talks on phone: Not on file    Gets together: Not on file    Attends religious service: Not on file    Active member of club or organization: Not on file    Attends meetings of clubs or organizations: Not on file    Relationship status: Not on file  . Intimate partner violence:    Fear of current or ex partner: Not on file    Emotionally abused: Not on file    Physically abused: Not on file    Forced sexual activity: Not on file  Other Topics Concern  . Not on file  Social History Narrative  . Not on file    Family History  Problem Relation Age of Onset  . Clotting disorder Father   . Clotting disorder Sister     ROS- All systems are reviewed and negative except as per the HPI above  Physical Exam: Vitals:   08/01/18 0958  BP: 122/78  Pulse: 65  Weight: 100.6 kg  Height: 5\' 8"  (1.727 m)   Wt Readings from Last 3 Encounters:  08/01/18 100.6 kg  07/19/18 99.3 kg  07/10/18 102.1 kg    Labs: Lab Results  Component  Value Date   NA 142 07/19/2018   K 4.1 07/19/2018   CL 105 07/19/2018   CO2 27 07/19/2018   GLUCOSE 110 (H) 07/19/2018   BUN 23 07/19/2018   CREATININE 1.26 (H) 07/19/2018   CALCIUM 9.3 07/19/2018   Lab Results  Component Value Date   INR 1.07 03/26/2018   No results found for: CHOL,  HDL, LDLCALC, TRIG   GEN- The patient is well appearing, alert and oriented x 3 today.   Head- normocephalic, atraumatic Eyes-  Sclera clear, conjunctiva pink Ears- hearing intact Oropharynx- clear Neck- supple, no JVP Lymph- no cervical lymphadenopathy Lungs- Clear to ausculation bilaterally, normal work of breathing Heart-irregular rate and rhythm, no murmurs, rubs or gallops, PMI not laterally displaced GI- soft, NT, ND, + BS Extremities- no clubbing, cyanosis, or edema MS- no significant deformity or atrophy Skin- no rash or lesion Psych- euthymic mood, full affect Neuro- strength and sensation are intact  EKG- afib at 65  bpm Echo-Study Conclusions  - Left ventricle: The cavity size was normal. There was mild   concentric hypertrophy. Systolic function was normal. The   estimated ejection fraction was in the range of 60% to 65%. Wall   motion was normal; there were no regional wall motion   abnormalities. - Aortic valve: Moderately to severely calcified annulus.   Trileaflet; mildly thickened, mildly calcified leaflets. - Mitral valve: Calcified annulus. - Left atrium: The atrium was mildly dilated. - Tricuspid valve: There was mild regurgitation. - Pulmonary arteries: Systolic pressure could not be accurately   estimated.    Assessment and Plan: 1. New onset afib May 2019 General  education re afib, ERAF after successful cardioversion and need for antiarrythmic's to try to restore SR He is rate controlled Discussion with pt restoring SR Given option of Tikosyn, amiodarone or sotalol, flecainide not an option  for h/o CAD , he has issues with brady in SR so sotalol not a good  option for that reason, he does not like side effect profile of amiodarone He will check on price of drug, but feels he fits drug assistance guidelines for tikosyn He does not use benadryl Drugs to be screened by PharmD if pt decides on Tikosyn   2. CHA2DS2VASc score of  4 He will continue eliquis 5 mg bid,he states no missed doses   3. CAD No ischemic symptoms  4. HTN Stable   Pt will discuss with wife and will  call back to schedule Tikosyn admit   Elvina Sidle. [facility] [address] [place], [place] [address] [phone]

## 2018-08-06 NOTE — Addendum Note (Signed)
Encounter addended by: Newman Nip, NP on: 08/06/2018 10:30 AM  Actions taken: LOS modified

## 2018-08-12 ENCOUNTER — Other Ambulatory Visit: Payer: Self-pay

## 2018-08-12 ENCOUNTER — Telehealth: Payer: Self-pay | Admitting: Pharmacist

## 2018-08-12 ENCOUNTER — Inpatient Hospital Stay (HOSPITAL_COMMUNITY)
Admission: AD | Admit: 2018-08-12 | Discharge: 2018-08-15 | DRG: 310 | Disposition: A | Discharge status: Home / Self Care | Payer: Medicare Other | Source: Ambulatory Visit | Attending: Internal Medicine | Admitting: Internal Medicine

## 2018-08-12 ENCOUNTER — Ambulatory Visit (HOSPITAL_COMMUNITY)
Admission: RE | Admit: 2018-08-12 | Discharge: 2018-08-12 | Disposition: A | Discharge status: Home / Self Care | Payer: Medicare Other | Source: Ambulatory Visit | Attending: Nurse Practitioner | Admitting: Nurse Practitioner

## 2018-08-12 ENCOUNTER — Encounter (HOSPITAL_COMMUNITY): Payer: Self-pay | Admitting: General Practice

## 2018-08-12 ENCOUNTER — Encounter (HOSPITAL_COMMUNITY): Payer: Self-pay | Admitting: Nurse Practitioner

## 2018-08-12 VITALS — BP 122/64 | HR 82 | Ht 68.0 in | Wt 217.0 lb

## 2018-08-12 DIAGNOSIS — I10 Essential (primary) hypertension: Secondary | ICD-10-CM | POA: Diagnosis present

## 2018-08-12 DIAGNOSIS — I4819 Other persistent atrial fibrillation: Principal | ICD-10-CM | POA: Diagnosis present

## 2018-08-12 DIAGNOSIS — I481 Persistent atrial fibrillation: Secondary | ICD-10-CM | POA: Diagnosis not present

## 2018-08-12 DIAGNOSIS — Z23 Encounter for immunization: Secondary | ICD-10-CM

## 2018-08-12 DIAGNOSIS — Z7901 Long term (current) use of anticoagulants: Secondary | ICD-10-CM | POA: Diagnosis not present

## 2018-08-12 DIAGNOSIS — Z7982 Long term (current) use of aspirin: Secondary | ICD-10-CM | POA: Diagnosis not present

## 2018-08-12 DIAGNOSIS — Z79899 Other long term (current) drug therapy: Secondary | ICD-10-CM

## 2018-08-12 DIAGNOSIS — I2510 Atherosclerotic heart disease of native coronary artery without angina pectoris: Secondary | ICD-10-CM | POA: Diagnosis present

## 2018-08-12 DIAGNOSIS — I739 Peripheral vascular disease, unspecified: Secondary | ICD-10-CM | POA: Diagnosis present

## 2018-08-12 DIAGNOSIS — K219 Gastro-esophageal reflux disease without esophagitis: Secondary | ICD-10-CM | POA: Diagnosis present

## 2018-08-12 DIAGNOSIS — E785 Hyperlipidemia, unspecified: Secondary | ICD-10-CM | POA: Diagnosis present

## 2018-08-12 DIAGNOSIS — I252 Old myocardial infarction: Secondary | ICD-10-CM

## 2018-08-12 DIAGNOSIS — Z951 Presence of aortocoronary bypass graft: Secondary | ICD-10-CM | POA: Diagnosis not present

## 2018-08-12 DIAGNOSIS — Z832 Family history of diseases of the blood and blood-forming organs and certain disorders involving the immune mechanism: Secondary | ICD-10-CM | POA: Diagnosis not present

## 2018-08-12 DIAGNOSIS — Z5181 Encounter for therapeutic drug level monitoring: Secondary | ICD-10-CM

## 2018-08-12 HISTORY — DX: Unspecified atrial fibrillation: I48.91

## 2018-08-12 HISTORY — DX: Unspecified hearing loss, unspecified ear: H91.90

## 2018-08-12 LAB — MAGNESIUM: Magnesium: 2.2 mg/dL (ref 1.7–2.4)

## 2018-08-12 LAB — BASIC METABOLIC PANEL
Anion gap: 6 (ref 5–15)
BUN: 28 mg/dL — ABNORMAL HIGH (ref 8–23)
CHLORIDE: 109 mmol/L (ref 98–111)
CO2: 26 mmol/L (ref 22–32)
CREATININE: 0.98 mg/dL (ref 0.61–1.24)
Calcium: 9.3 mg/dL (ref 8.9–10.3)
GFR calc non Af Amer: >60 mL/min (ref >60)
Glucose, Bld: 119 mg/dL — ABNORMAL HIGH (ref 70–99)
Potassium: 3.8 mmol/L (ref 3.5–5.1)
Sodium: 141 mmol/L (ref 135–145)

## 2018-08-12 LAB — POTASSIUM: Potassium: 4.1 mmol/L (ref 3.5–5.1)

## 2018-08-12 MED ORDER — SODIUM CHLORIDE 0.9% FLUSH
3.0000 mL | Freq: Two times a day (BID) | INTRAVENOUS | Status: DC
  Administered 2018-08-12 – 2018-08-13 (×4): 3 mL via INTRAVENOUS

## 2018-08-12 MED ORDER — NITROGLYCERIN 0.4 MG SL SUBL: 0.4000 mg | SUBLINGUAL_TABLET | SUBLINGUAL | Status: DC | PRN

## 2018-08-12 MED ORDER — LOSARTAN POTASSIUM 50 MG PO TABS
50.0000 mg | ORAL_TABLET | Freq: Every day | ORAL | Status: DC
  Administered 2018-08-13 – 2018-08-15 (×3): 50 mg via ORAL
  Filled 2018-08-12 (×3): qty 1

## 2018-08-12 MED ORDER — OMEGA-3-ACID ETHYL ESTERS 1 G PO CAPS
1.0000 g | ORAL_CAPSULE | Freq: Every day | ORAL | Status: DC
  Administered 2018-08-12 – 2018-08-15 (×4): 1 g via ORAL
  Filled 2018-08-12 (×4): qty 1

## 2018-08-12 MED ORDER — SODIUM CHLORIDE 0.9 % IV SOLN: 250.0000 mL | INTRAVENOUS | Status: DC | PRN

## 2018-08-12 MED ORDER — ASPIRIN EC 81 MG PO TBEC
81.0000 mg | DELAYED_RELEASE_TABLET | Freq: Every day | ORAL | Status: DC
  Administered 2018-08-13 – 2018-08-15 (×3): 81 mg via ORAL
  Filled 2018-08-12 (×3): qty 1

## 2018-08-12 MED ORDER — FISH OIL ULTRA 1400 MG PO CAPS: 1400.0000 mg | ORAL_CAPSULE | Freq: Every day | ORAL | Status: DC

## 2018-08-12 MED ORDER — APIXABAN 5 MG PO TABS
5.0000 mg | ORAL_TABLET | Freq: Two times a day (BID) | ORAL | Status: DC
  Administered 2018-08-12 – 2018-08-15 (×6): 5 mg via ORAL
  Filled 2018-08-12 (×6): qty 1

## 2018-08-12 MED ORDER — SODIUM CHLORIDE 0.9% FLUSH: 3.0000 mL | INTRAVENOUS | Status: DC | PRN

## 2018-08-12 MED ORDER — BUDESONIDE 0.25 MG/2ML IN SUSP
0.2500 mg | Freq: Two times a day (BID) | RESPIRATORY_TRACT | Status: DC
  Filled 2018-08-12: qty 2

## 2018-08-12 MED ORDER — DOFETILIDE 500 MCG PO CAPS
500.0000 ug | ORAL_CAPSULE | Freq: Two times a day (BID) | ORAL | Status: DC
  Administered 2018-08-12 – 2018-08-15 (×6): 500 ug via ORAL
  Filled 2018-08-12 (×6): qty 1

## 2018-08-12 MED ORDER — PRAVASTATIN SODIUM 20 MG PO TABS
10.0000 mg | ORAL_TABLET | Freq: Every day | ORAL | Status: DC
  Administered 2018-08-12 – 2018-08-14 (×3): 10 mg via ORAL
  Filled 2018-08-12 (×3): qty 1

## 2018-08-12 MED ORDER — ATENOLOL 25 MG PO TABS
12.5000 mg | ORAL_TABLET | Freq: Every day | ORAL | Status: DC
  Administered 2018-08-12 – 2018-08-14 (×3): 12.5 mg via ORAL
  Filled 2018-08-12 (×3): qty 1

## 2018-08-12 MED ORDER — FINASTERIDE 5 MG PO TABS
5.0000 mg | ORAL_TABLET | Freq: Every day | ORAL | Status: DC
  Administered 2018-08-13 – 2018-08-15 (×3): 5 mg via ORAL
  Filled 2018-08-12 (×3): qty 1

## 2018-08-12 MED ORDER — POTASSIUM CHLORIDE CRYS ER 20 MEQ PO TBCR
40.0000 meq | EXTENDED_RELEASE_TABLET | Freq: Once | ORAL | Status: AC
  Administered 2018-08-12: 40 meq via ORAL
  Filled 2018-08-12: qty 2

## 2018-08-12 MED ORDER — PANTOPRAZOLE SODIUM 40 MG PO TBEC
40.0000 mg | DELAYED_RELEASE_TABLET | Freq: Every day | ORAL | Status: DC
  Administered 2018-08-13 – 2018-08-15 (×3): 40 mg via ORAL
  Filled 2018-08-12 (×3): qty 1

## 2018-08-12 NOTE — Telephone Encounter (Signed)
Medication list reviewed in anticipation of upcoming Tikosyn initiation. Patient is not taking any contraindicated or QTc prolonging medications.   Patient is anticoagulated on Eliquis 5mg BID on the appropriate dose. Please ensure that patient has not missed any anticoagulation doses in the 3 weeks prior to Tikosyn initiation.   Patient will need to be counseled to avoid use of Benadryl while on Tikosyn and in the 2-3 days prior to Tikosyn initiation. 

## 2018-08-12 NOTE — H&P (Addendum)
Cardiology Admission History and Physical:   Patient ID: Kevin Gonzales MRN: [id number]; DOB: 14-Oct-1941   Admission date: 08/12/2018  Primary Care Provider: Karle Plumber, MD Primary Cardiologist: Dr. Jobie Quaker Primary Electrophysiologist:  None   Chief Complaint:  Kevin Gonzales initiation  Patient Profile:   Kevin Gonzales is a 77 y.o. male with HTN, CAD (CABG 1991, PCI 2005), PVD s/p R CEA, HTN, HLD, and PAF  History of Present Illness:   Kevin Gonzales was noted to have new AFib in May 2019, referred to the AFib clinic for evaluation of rhythm control options.  DCCV 07/25/18 successful in restoring SR lasting only about 24 hours, though with improved overall feeling of well being and energy those 24hours and was decided to pursue AAD tx.  He has had some degree of bradycardia sotalol not felt the best option, amiodarone would like to try and avoid given side effect profile.  He comes today for Tikosyn initiation.   Outside of fatigue thought to be 2/2 the AFib, he feels well.  We discussed Tikosyn, potential risks, benefits and protocol for initiation and plans for DCCV on Wed if needed.  He is agreeable to proceed.  Reports no missed doses of Eliquis in the last 3 weeks.  Past Medical History:  Diagnosis Date  . Abdominal obesity   . Alcohol abuse   . Arthritis   . Atrial fibrillation (HCC)   . Carotid artery occlusion   . Carotid bruit   . Chest pain   . Chondrodermatitis nodularis helicis, left   . Coronary artery disease   . GERD (gastroesophageal reflux disease)   . HOH (hard of hearing)   . Hyperlipidemia   . Hypertension   . Myocardial infarction (HCC) 1991  . Thyroid disease   . Vitamin D deficiency     Past Surgical History:  Procedure Laterality Date  . CARDIOVERSION N/A 07/25/2018   Procedure: CARDIOVERSION;  Surgeon: Thurmon Fair, MD;  Location: [facility];  Service: Cardiovascular;  Laterality: N/A;  . CAROTID ENDARTERECTOMY Right 03/29/2018  . CAROTID  STENT  2004  . CORONARY ARTERY BYPASS GRAFT     1 vessel  . ENDARTERECTOMY Right 03/29/2018   Procedure: Right Carotid Endartarectomy;  Surgeon: Larina Earthly, MD;  Location[facility] OR;  Service: Vascular;  Laterality: Right;  . LEFT HEART CATH AND CORS/GRAFTS ANGIOGRAPHY N/A 05/10/2017   Procedure: Left Heart Cath and Cors/Grafts Angiography;  Surgeon: Runell Gess, MD;  Location: [facility] INVASIVE CV LAB;  Service: Cardiovascular;  Laterality: N/A;  . PATCH ANGIOPLASTY Right 03/29/2018   Procedure: PATCH ANGIOPLASTY;  Surgeon: Larina Earthly, MD;  Location: [facility] OR;  Service: Vascular;  Laterality: Right;     Medications Prior to Admission: Prior to Admission medications   Medication Sig Start Date End Date Taking? Authorizing Provider  apixaban (ELIQUIS) 5 MG TABS tablet Take 1 tablet (5 mg total) by mouth 2 (two) times daily. 03/31/18   Rhyne, Ames Coupe, PA-C  aspirin EC 81 MG tablet Take 81 mg by mouth daily. 06/24/09   [provider]  atenolol (TENORMIN) 25 MG tablet Take 12.5 mg by mouth at bedtime.     [provider]  beclomethasone (QVAR) 80 MCG/ACT inhaler Inhale 1 puff into the lungs 2 (two) times daily as needed (for shortness of breath).     [provider]  finasteride (PROSCAR) 5 MG tablet Take 5 mg by mouth daily. 05/24/16   [provider]  HYDROcodone-acetaminophen (NORCO) 7.5-325 MG tablet  Take 1 tablet by mouth every 6 (six) hours as needed (for pain.). 03/29/18   Rhyne, Ames Coupe, PA-C  ibuprofen (ADVIL,MOTRIN) 200 MG tablet Take 200 mg by mouth daily as needed for moderate pain.    [provider]  losartan (COZAAR) 50 MG tablet Take 50 mg by mouth daily.    [provider]  meclizine (ANTIVERT) 25 MG tablet Take 25 mg by mouth 3 (three) times daily as needed for dizziness.    [provider]  nitroGLYCERIN (NITROSTAT) 0.4 MG SL tablet Place 0.4 mg under the tongue every 5 (five) minutes as needed for chest pain.     [provider]  Omega-3 Fatty Acids (FISH OIL ULTRA) 1400 MG CAPS Take 1,400 mg by mouth daily.    [provider]  omeprazole (PRILOSEC) 20 MG capsule Take 20 mg by mouth daily before breakfast.     [provider]  pravastatin (PRAVACHOL) 10 MG tablet Take 10 mg by mouth at bedtime.    [provider]  Vitamin D, Ergocalciferol, (DRISDOL) 50000 units CAPS capsule Take 50,000 Units by mouth every Sunday.     [provider]     Allergies:    Allergies  Allergen Reactions  . Perflutren Lipid Microsphere Other (See Comments)    Severe lower back, tailbone, and butt pain. Minor chest pain    Social History:   Social History   Socioeconomic History  . Marital status: Married    Spouse name: Not on file  . Number of children: Not on file  . Years of education: Not on file  . Highest education level: Not on file  Occupational History  . Not on file  Social Needs  . Financial resource strain: Not on file  . Food insecurity:    Worry: Not on file    Inability: Not on file  . Transportation needs:    Medical: Not on file    Non-medical: Not on file  Tobacco Use  . Smoking status: Former Smoker    Types: Cigarettes    Last attempt to quit: 1991    Years since quitting: 28.7  . Smokeless tobacco: Never Used  Substance and Sexual Activity  . Alcohol use: Not Currently  . Drug use: No  . Sexual activity: Not Currently  Lifestyle  . Physical activity:    Days per week: Not on file    Minutes per session: Not on file  . Stress: Not on file  Relationships  . Social connections:    Talks on phone: Not on file    Gets together: Not on file    Attends religious service: Not on file    Active member of club or organization: Not on file    Attends meetings of clubs or organizations: Not on file    Relationship status: Not on file  . Intimate partner violence:    Fear of current or ex partner: Not on file    Emotionally abused: Not on  file    Physically abused: Not on file    Forced sexual activity: Not on file  Other Topics Concern  . Not on file  Social History Narrative  . Not on file    Family History:   The patient's family history includes Clotting disorder in his father and sister.    ROS:  Please see the history of present illness.  All other ROS reviewed and negative.     Physical Exam/Data:  There were no vitals filed  for this visit. No intake or output data in the 24 hours ending 08/12/18 1340 There were no vitals filed for this visit. There is no height or weight on file to calculate BMI.  General:  Well nourished, well developed, in no acute distress HEENT: normal Lymph: no adenopathy Neck: no JVD Endocrine:  No thryomegaly Vascular: No carotid bruits Cardiac:  irreg-irreg; no murmurs, gallops or rubs Lungs:  CTA b/l, no wheezing, rhonchi or rales  Abd: soft, nontender  Ext: no edema Musculoskeletal:  No deformities, age appropriate atrophy Skin: warm and dry  Neuro:  no focal abnormalities noted Psych:  Normal affect    EKG:  The ECG that was done today was personally reviewed and demonstrates AFib 82bpm, measured QT , QTc  Relevant CV Studies:  07/12/18; TTE Study Conclusions - Left ventricle: The cavity size was normal. There was mild   concentric hypertrophy. Systolic function was normal. The   estimated ejection fraction was in the range of 60% to 65%. Wall   motion was normal; there were no regional wall motion   abnormalities. - Aortic valve: Moderately to severely calcified annulus.   Trileaflet; mildly thickened, mildly calcified leaflets. - Mitral valve: Calcified annulus. - Left atrium: The atrium was mildly dilated. - Tricuspid valve: There was mild regurgitation. - Pulmonary arteries: Systolic pressure could not be accurately   estimated.  05/10/17: LHC  Ost LAD to Mid LAD lesion, 0 %stenosed.  Ost Cx to Prox Cx lesion, 90 %stenosed.  Ost LM to LM  lesion, 40 %stenosed.  Ost RCA to Prox RCA lesion, 100 %stenosed.  RIMA and is normal in caliber and anatomically normal.  The left ventricular systolic function is normal.  LV end diastolic pressure is normal.  The left ventricular ejection fraction is 55-65% by visual estimate.  Laboratory Data:  Chemistry Recent Labs  Lab 08/12/18 1148  NA 141  K 3.8  CL 109  CO2 26  GLUCOSE 119*  BUN 28*  CREATININE 0.98  CALCIUM 9.3  GFRNONAA >60  GFRAA >60  ANIONGAP 6    No results for input(s): PROT, ALBUMIN, AST, ALT, ALKPHOS, BILITOT in the last 168 hours. HematologyNo results for input(s): WBC, RBC, HGB, HCT, MCV, MCH, MCHC, RDW, PLT in the last 168 hours. Cardiac EnzymesNo results for input(s): TROPONINI in the last 168 hours. No results for input(s): TROPIPOC in the last 168 hours.  BNPNo results for input(s): BNP, PROBNP in the last 168 hours.  DDimer No results for input(s): DDIMER in the last 168 hours.  Radiology/Studies:  No results found.  Assessment and Plan:   1. Persistent AFib, here for Tikosyn load     CHA2DS2Vasc is 4, on Eliquis, appropriately dosed     K+ 3.8, replacement ordered     Mag 2.2      Creat 0.98 (calc CrCl is 89)     QTc is OK  Plan to start with DCCV Wed if not in SR  2. CAD     No anginal c/o     Continue home meds  3. HTN     continue home regime        For questions or updates, please contact [facility] Please consult [email or web address] for contact info under   Signed, Sheilah Pigeon, PA-C  08/12/2018 1:40 PM   EP Attending  Patient seen and examined. Agree with the findings as noted above. The patient presents today for initiation of dofetilide due to atrial fib with a  CVR/RVR. His QT is acceptable as is his renal function. We will admit and plan for DCCV on Wednesday if he has not reverted to NSR. Exam is accurately documented as above.   Leonia Reeves.D.

## 2018-08-12 NOTE — Progress Notes (Signed)
Primary Care Physician: Karle Plumber, MD Referring Physician: Dr. Morrie Sheldon is a 77 y.o. male with a h/o CAD S/p CABG, HTN, that is being evaluated in the afib clinic for evaluation of new onset afib. Per pt this was found at the time of his rt endarterectomy  03/29/18. He has been  rate controlled since  then. He was placed on anticoagulation in  May.Echo in August showed EF at 60-65%. He has noted some fatigue since his surgery.  F/u in afib clinic,9/19, he opted to go for cardioversion alone and was successful. However, it returned within 24 hours, but for those 24 hours he felt much better. He is now willing to consider antiarrythmic's.  Today, he denies symptoms of palpitations, chest pain, shortness of breath, orthopnea, PND, lower extremity edema, dizziness, presyncope, syncope, or neurologic sequela. + for fatigue. The patient is tolerating medications without difficulties and is otherwise without complaint today.   Past Medical History:  Diagnosis Date  . Abdominal obesity   . Alcohol abuse   . Arthritis   . Carotid artery occlusion   . Carotid bruit   . Chest pain   . Chondrodermatitis nodularis helicis, left   . Coronary artery disease   . GERD (gastroesophageal reflux disease)   . Hyperlipidemia   . Hypertension   . Myocardial infarction (HCC) 1991  . Thyroid disease   . Vitamin D deficiency    Past Surgical History:  Procedure Laterality Date  . CARDIOVERSION N/A 07/25/2018   Procedure: CARDIOVERSION;  Surgeon: Thurmon Fair, MD;  Location: [facility]COPY;  Service: Cardiovascular;  Laterality: N/A;  . CAROTID ENDARTERECTOMY Right 03/29/2018  . CAROTID STENT  2004  . CORONARY ARTERY BYPASS GRAFT     1 vessel  . ENDARTERECTOMY Right 03/29/2018   Procedure: Right Carotid Endartarectomy;  Surgeon: Larina Earthly, MD;  Location: [facility] OR;  Service: Vascular;  Laterality: Right;  . LEFT HEART CATH AND CORS/GRAFTS ANGIOGRAPHY N/A 05/10/2017   Procedure:  Left Heart Cath and Cors/Grafts Angiography;  Surgeon: Runell Gess, MD;  Location: [facility] INVASIVE CV LAB;  Service: Cardiovascular;  Laterality: N/A;  . PATCH ANGIOPLASTY Right 03/29/2018   Procedure: PATCH ANGIOPLASTY;  Surgeon: Larina Earthly, MD;  Location: [facility] OR;  Service: Vascular;  Laterality: Right;    Current Outpatient Medications  Medication Sig Dispense Refill  . apixaban (ELIQUIS) 5 MG TABS tablet Take 1 tablet (5 mg total) by mouth 2 (two) times daily. 60 tablet 2  . aspirin EC 81 MG tablet Take 81 mg by mouth daily.    Marland Kitchen atenolol (TENORMIN) 25 MG tablet Take 12.5 mg by mouth at bedtime.     . beclomethasone (QVAR) 80 MCG/ACT inhaler Inhale 1 puff into the lungs 2 (two) times daily as needed (for shortness of breath).     . finasteride (PROSCAR) 5 MG tablet Take 5 mg by mouth daily.    Marland Kitchen HYDROcodone-acetaminophen (NORCO) 7.5-325 MG tablet Take 1 tablet by mouth every 6 (six) hours as needed (for pain.). 8 tablet 0  . ibuprofen (ADVIL,MOTRIN) 200 MG tablet Take 200 mg by mouth daily as needed for moderate pain.    Marland Kitchen losartan (COZAAR) 50 MG tablet Take 50 mg by mouth daily.    . meclizine (ANTIVERT) 25 MG tablet Take 25 mg by mouth 3 (three) times daily as needed for dizziness.    . Omega-3 Fatty Acids (FISH OIL ULTRA) 1400 MG CAPS Take 1,400 mg by mouth daily.    Marland Kitchen  omeprazole (PRILOSEC) 20 MG capsule Take 20 mg by mouth daily before breakfast.     . pravastatin (PRAVACHOL) 10 MG tablet Take 10 mg by mouth at bedtime.    . Vitamin D, Ergocalciferol, (DRISDOL) 50000 units CAPS capsule Take 50,000 Units by mouth every Sunday.     . nitroGLYCERIN (NITROSTAT) 0.4 MG SL tablet Place 0.4 mg under the tongue every 5 (five) minutes as needed for chest pain.     No current facility-administered medications for this encounter.     Allergies  Allergen Reactions  . Perflutren Lipid Microsphere Other (See Comments)    Severe lower back, tailbone, and butt pain. Minor chest pain     Social History   Socioeconomic History  . Marital status: Married    Spouse name: Not on file  . Number of children: Not on file  . Years of education: Not on file  . Highest education level: Not on file  Occupational History  . Not on file  Social Needs  . Financial resource strain: Not on file  . Food insecurity:    Worry: Not on file    Inability: Not on file  . Transportation needs:    Medical: Not on file    Non-medical: Not on file  Tobacco Use  . Smoking status: Former Smoker    Types: Cigarettes    Last attempt to quit: 1991    Years since quitting: 28.7  . Smokeless tobacco: Never Used  Substance and Sexual Activity  . Alcohol use: Not Currently  . Drug use: No  . Sexual activity: Never  Lifestyle  . Physical activity:    Days per week: Not on file    Minutes per session: Not on file  . Stress: Not on file  Relationships  . Social connections:    Talks on phone: Not on file    Gets together: Not on file    Attends religious service: Not on file    Active member of club or organization: Not on file    Attends meetings of clubs or organizations: Not on file    Relationship status: Not on file  . Intimate partner violence:    Fear of current or ex partner: Not on file    Emotionally abused: Not on file    Physically abused: Not on file    Forced sexual activity: Not on file  Other Topics Concern  . Not on file  Social History Narrative  . Not on file    Family History  Problem Relation Age of Onset  . Clotting disorder Father   . Clotting disorder Sister     ROS- All systems are reviewed and negative except as per the HPI above  Physical Exam: Vitals:   08/12/18 1021  BP: 122/64  Pulse: 82  Weight: 98.4 kg  Height: 5\' 8"  (1.727 m)   Wt Readings from Last 3 Encounters:  08/12/18 98.4 kg  08/01/18 100.6 kg  07/19/18 99.3 kg    Labs: Lab Results  Component Value Date   NA 142 07/19/2018   K 4.1 07/19/2018   CL 105 07/19/2018    CO2 27 07/19/2018   GLUCOSE 110 (H) 07/19/2018   BUN 23 07/19/2018   CREATININE 1.26 (H) 07/19/2018   CALCIUM 9.3 07/19/2018   Lab Results  Component Value Date   INR 1.07 03/26/2018   No results found for: CHOL, HDL, LDLCALC, TRIG   GEN- The patient is well appearing, alert and oriented x 3 today.  Head- normocephalic, atraumatic Eyes-  Sclera clear, conjunctiva pink Ears- hearing intact Oropharynx- clear Neck- supple, no JVP Lymph- no cervical lymphadenopathy Lungs- Clear to ausculation bilaterally, normal work of breathing Heart-irregular rate and rhythm, no murmurs, rubs or gallops, PMI not laterally displaced GI- soft, NT, ND, + BS Extremities- no clubbing, cyanosis, or edema MS- no significant deformity or atrophy Skin- no rash or lesion Psych- euthymic mood, full affect Neuro- strength and sensation are intact  EKG- afib at 82  Bpm, qrs int 72 ms, qtc 432 ms Echo-Study Conclusions  - Left ventricle: The cavity size was normal. There was mild   concentric hypertrophy. Systolic function was normal. The   estimated ejection fraction was in the range of 60% to 65%. Wall   motion was normal; there were no regional wall motion   abnormalities. - Aortic valve: Moderately to severely calcified annulus.   Trileaflet; mildly thickened, mildly calcified leaflets. - Mitral valve: Calcified annulus. - Left atrium: The atrium was mildly dilated. - Tricuspid valve: There was mild regurgitation. - Pulmonary arteries: Systolic pressure could not be accurately   estimated.    Assessment and Plan: 1. New onset afib May 2019 General  education re afib, ERAF after successful cardioversion and need for antiarrythmic's to try to restore SR He is rate controlled Discussion with pt restoring SR Given option of Tikosyn, amiodarone or sotalol, flecainide not an option  for h/o CAD , he has issues with brady in SR so sotalol not a good option for that reason, he does not like side  effect profile of amiodarone He fits drug assistance guidelines for tikosyn He does not use benadryl PharmD screened drugs, no qtc offending drugs  2. CHA2DS2VASc score of  4 He will continue eliquis 5 mg bid,he states no missed doses for at least 3 weeks  3. CAD No ischemic symptoms  4. HTN Stable     Erikson Danzy [facility] [address] [place], [place] [address] [phone]

## 2018-08-12 NOTE — Progress Notes (Signed)
Pharmacy Review for Dofetilide (Tikosyn) Initiation  Admit Complaint: 77 y.o. male admitted 08/12/2018 with atrial fibrillation to be initiated on dofetilide.   Assessment:  Patient Exclusion Criteria: If any screening criteria checked as "Yes", then  patient  should NOT receive dofetilide until criteria item is corrected. If "Yes" please indicate correction plan.  YES  NO Patient  Exclusion Criteria Correction Plan  []  [x]  Baseline QTc interval is greater than or equal to 440 msec. IF above YES box checked dofetilide contraindicated unless patient has ICD; then may proceed if QTc 500-550 msec or with known ventricular conduction abnormalities may proceed with QTc 550-600 msec. QTc =     []  [x]  Magnesium level is less than 1.8 mEq/l : Last magnesium:  Lab Results  Component Value Date   MG 2.2 08/12/2018         [x]  []  Potassium level is less than 4 mEq/l : Last potassium:  Lab Results  Component Value Date   K 3.8 08/12/2018       Replacing K now  []  [x]  Patient is known or suspected to have a digoxin level greater than 2 ng/ml: No results found for: DIGOXIN    []  [x]  Creatinine clearance less than 20 ml/min (calculated using Cockcroft-Gault, actual body weight and serum creatinine): Estimated Creatinine Clearance: 72.9 mL/min (by C-G formula based on SCr of 0.98 mg/dL).    []  [x]  Patient has received drugs known to prolong the QT intervals within the last 48 hours (phenothiazines, tricyclics or tetracyclic antidepressants, erythromycin, H-1 antihistamines, cisapride, fluoroquinolones, azithromycin). Drugs not listed above may have an, as yet, undetected potential to prolong the QT interval, updated information on QT prolonging agents is available at this website:QT prolonging agents   []  [x]  Patient received a dose of hydrochlorothiazide (Oretic) alone or in any combination including triamterene (Dyazide, Maxzide) in the last 48 hours.   []  [x]  Patient received a medication known  to increase dofetilide plasma concentrations prior to initial dofetilide dose:  . Trimethoprim (Primsol, Proloprim) in the last 36 hours . Verapamil (Calan, Verelan) in the last 36 hours or a sustained release dose in the last 72 hours . Megestrol (Megace) in the last 5 days  . Cimetidine (Tagamet) in the last 6 hours . Ketoconazole (Nizoral) in the last 24 hours . Itraconazole (Sporanox) in the last 48 hours  . Prochlorperazine (Compazine) in the last 36 hours    []  [x]  Patient is known to have a history of torsades de pointes; congenital or acquired long QT syndromes.   []  [x]  Patient has received a Class 1 antiarrhythmic with less than 2 half-lives since last dose. (Disopyramide, Quinidine, Procainamide, Lidocaine, Mexiletine, Flecainide, Propafenone)   []  [x]  Patient has received amiodarone therapy in the past 3 months or amiodarone level is greater than 0.3 ng/ml.    Patient has been appropriately anticoagulated with Eliquis.  Ordering provider was confirmed at [email or web address] if they are not listed on the [facility] Authorized Prescribers list.  Goal of Therapy: Follow renal function, electrolytes, potential drug interactions, and dose adjustment. Provide education and 1 week supply at discharge.  Plan:  [x]   Physician selected initial dose within range recommended for patients level of renal function - will monitor for response.  []   Physician selected initial dose outside of range recommended for patients level of renal function - will discuss if the dose should be altered at this time.   Select One Calculated CrCl  Dose q12h  [x]  > 60 ml/min 500  mcg  []  40-60 ml/min 250 mcg  []  20-40 ml/min 125 mcg   2. Follow up QTc after the first 5 doses, renal function, electrolytes (K & Mg) daily x 3     days, dose adjustment, success of initiation and facilitate 1 week discharge supply as     clinically indicated.  3. Initiate Tikosyn education video (Call [phone] and ask for Tikosyn  Video # [phone]).  4. Place Enrollment Form on the chart for discharge supply of dofetilide.   Jenetta Downer, [facility] Clinical Pharmacist Phone [phone]  08/12/2018 2:07 PM

## 2018-08-13 DIAGNOSIS — I4819 Other persistent atrial fibrillation: Principal | ICD-10-CM

## 2018-08-13 LAB — BASIC METABOLIC PANEL
Anion gap: 5 (ref 5–15)
BUN: 25 mg/dL — AB (ref 8–23)
CALCIUM: 8.4 mg/dL — AB (ref 8.9–10.3)
CO2: 24 mmol/L (ref 22–32)
CREATININE: 0.91 mg/dL (ref 0.61–1.24)
Chloride: 110 mmol/L (ref 98–111)
GFR calc Af Amer: >60 mL/min (ref >60)
GLUCOSE: 107 mg/dL — AB (ref 70–99)
Potassium: 3.9 mmol/L (ref 3.5–5.1)
Sodium: 139 mmol/L (ref 135–145)

## 2018-08-13 LAB — MAGNESIUM: MAGNESIUM: 2 mg/dL (ref 1.7–2.4)

## 2018-08-13 MED ORDER — SODIUM CHLORIDE 0.9% FLUSH: 3.0000 mL | INTRAVENOUS | Status: DC | PRN

## 2018-08-13 MED ORDER — HYDROCORTISONE 1 % EX CREA
1.0000 "application " | TOPICAL_CREAM | Freq: Three times a day (TID) | CUTANEOUS | Status: DC | PRN
  Filled 2018-08-13: qty 28

## 2018-08-13 MED ORDER — POTASSIUM CHLORIDE CRYS ER 20 MEQ PO TBCR
40.0000 meq | EXTENDED_RELEASE_TABLET | Freq: Once | ORAL | Status: AC
  Administered 2018-08-13: 40 meq via ORAL
  Filled 2018-08-13: qty 2

## 2018-08-13 MED ORDER — IBUPROFEN 200 MG PO TABS
200.0000 mg | ORAL_TABLET | Freq: Every day | ORAL | Status: DC | PRN
  Administered 2018-08-13: 200 mg via ORAL
  Filled 2018-08-13: qty 1

## 2018-08-13 MED ORDER — SODIUM CHLORIDE 0.9% FLUSH
3.0000 mL | Freq: Two times a day (BID) | INTRAVENOUS | Status: DC
  Administered 2018-08-13 – 2018-08-15 (×4): 3 mL via INTRAVENOUS

## 2018-08-13 MED ORDER — SODIUM CHLORIDE 0.9 % IV SOLN: 250.0000 mL | INTRAVENOUS | Status: DC

## 2018-08-13 MED ORDER — PNEUMOCOCCAL VAC POLYVALENT 25 MCG/0.5ML IJ INJ
0.5000 mL | INJECTION | INTRAMUSCULAR | Status: AC
  Administered 2018-08-14: 0.5 mL via INTRAMUSCULAR
  Filled 2018-08-13: qty 0.5

## 2018-08-13 NOTE — Discharge Instructions (Addendum)
You have an appointment set up with the At[facility] Fibrillation Clinic.  Multiple studies have shown that being followed by a dedicated atrial fibrillation clinic in addition to the standard care you receive from your other physicians improves health. We believe that enrollment in the atrial fibrillation clinic will allow us to better care for you.  ° °The phone number to the Atrial Fibrillation Clinic is [phone]. The clinic is staffed Monday through Friday from 8:30am to 5pm. ° °Parking Directions: The clinic is located in the Heart and Vascular Building connected to [facility]. °1)From [address] turn on to [address] and go to the 3rd entrance  (Heart and Vascular entrance) on the right. °2)Look to the right for Heart &Vascular Parking Garage. °3)A code for the entrance is required please call the clinic to receive this.   °4)Take the elevators to the 1st floor. Registration is in the room with the glass walls at the end of the hallway. ° °If you have any trouble parking or locating the clinic, please don’t hesitate to call [phone]. ° ° ° °Information on my medicine - ELIQUIS® (apixaban) ° °This medication education was reviewed with me or my healthcare representative as part of my discharge preparation.  ° °Why was Eliquis® prescribed for you? °Eliquis® was prescribed for you to reduce the risk of a blood clot forming that can cause a stroke if you have a medical condition called atrial fibrillation (a type of irregular heartbeat). ° °What do You need to know about Eliquis® ? °Take your Eliquis® TWICE DAILY - one tablet in the morning and one tablet in the evening with or without food. If you have difficulty swallowing the tablet whole please discuss with your pharmacist how to take the medication safely. ° °Take Eliquis® exactly as prescribed by your doctor and DO NOT stop taking Eliquis® without talking to the doctor who prescribed the medication.  Stopping may increase your risk of  developing a stroke.  Refill your prescription before you run out. ° °After discharge, you should have regular check-up appointments with your healthcare provider that is prescribing your Eliquis®.  In the future your dose may need to be changed if your kidney function or weight changes by a significant amount or as you get older. ° °What do you do if you miss a dose? °If you miss a dose, take it as soon as you remember on the same day and resume taking twice daily.  Do not take more than one dose of ELIQUIS at the same time to make up a missed dose. ° °Important Safety Information °A possible side effect of Eliquis® is bleeding. You should call your healthcare provider right away if you experience any of the following: °? Bleeding from an injury or your nose that does not stop. °? Unusual colored urine (red or dark brown) or unusual colored stools (red or black). °? Unusual bruising for unknown reasons. °? A serious fall or if you hit your head (even if there is no bleeding). ° °Some medicines may interact with Eliquis® and might increase your risk of bleeding or clotting while on Eliquis®. To help avoid this, consult your healthcare provider or pharmacist prior to using any new prescription or non-prescription medications, including herbals, vitamins, non-steroidal anti-inflammatory drugs (NSAIDs) and supplements. ° °This website has more information on Eliquis® (apixaban): http://www.eliquis.com/eliquis/home ° °

## 2018-08-13 NOTE — Progress Notes (Addendum)
Progress Note  Patient Name: Kevin Gonzales Date of Encounter: 08/13/2018  Primary Cardiologist: Dr. Allyson Sabal  Subjective   Not much sleep, otherwise no complaints  Inpatient Medications    Scheduled Meds: . apixaban  5 mg Oral BID  . aspirin EC  81 mg Oral Daily  . atenolol  12.5 mg Oral QHS  . budesonide (PULMICORT) nebulizer solution  0.25 mg Nebulization BID  . dofetilide  500 mcg Oral BID  . finasteride  5 mg Oral Daily  . losartan  50 mg Oral Daily  . omega-3 acid ethyl esters  1 g Oral Daily  . pantoprazole  40 mg Oral Daily  . pravastatin  10 mg Oral QHS  . sodium chloride flush  3 mL Intravenous Q12H   Continuous Infusions: . sodium chloride     PRN Meds: sodium chloride, nitroGLYCERIN, sodium chloride flush   Vital Signs    Vitals:   08/12/18 1300 08/12/18 2024 08/13/18 0500  BP: 136/88 132/71 (!) 110/58  Pulse: 62 84 68  Resp:  16 16  Temp: 97.9 F (36.6 C) 98 F (36.7 C) 98.1 F (36.7 C)  TempSrc: Oral Oral Oral  SpO2: 99% 97% 98%  Weight:   99.4 kg    Intake/Output Summary (Last 24 hours) at 08/13/2018 0731 Last data filed at 08/12/2018 2030 Gross per 24 hour  Intake 443 ml  Output -  Net 443 ml   Filed Weights   08/13/18 0500  Weight: 99.4 kg    Telemetry    AFib CVR - Personally Reviewed  ECG    AFib 66bpm, QTc is OK - Personally Reviewed  Physical Exam   GEN: No acute distress.   Neck: No JVD Cardiac: irreg-irreg, no murmurs, rubs, or gallops.  Respiratory: CTA b/l GI: Soft, nontender, non-distended  MS: No edema; No deformity. Neuro:  Nonfocal  Psych: Normal affect   Labs    Chemistry Recent Labs  Lab 08/12/18 1148 08/12/18 1726 08/13/18 0348  NA 141  --  139  K 3.8 4.1 3.9  CL 109  --  110  CO2 26  --  24  GLUCOSE 119*  --  107*  BUN 28*  --  25*  CREATININE 0.98  --  0.91  CALCIUM 9.3  --  8.4*  GFRNONAA >60  --  >60  GFRAA >60  --  >60  ANIONGAP 6  --  5     HematologyNo results for input(s): WBC,  RBC, HGB, HCT, MCV, MCH, MCHC, RDW, PLT in the last 168 hours.  Cardiac EnzymesNo results for input(s): TROPONINI in the last 168 hours. No results for input(s): TROPIPOC in the last 168 hours.   BNPNo results for input(s): BNP, PROBNP in the last 168 hours.   DDimer No results for input(s): DDIMER in the last 168 hours.   Radiology    No results found.  Cardiac Studies   07/12/18; TTE Study Conclusions - Left ventricle: The cavity size was normal. There was mild concentric hypertrophy. Systolic function was normal. The estimated ejection fraction was in the range of 60% to 65%. Wall motion was normal; there were no regional wall motion abnormalities. - Aortic valve: Moderately to severely calcified annulus. Trileaflet; mildly thickened, mildly calcified leaflets. - Mitral valve: Calcified annulus. - Left atrium: The atrium was mildly dilated. - Tricuspid valve: There was mild regurgitation. - Pulmonary arteries: Systolic pressure could not be accurately estimated.  05/10/17: LHC  Ost LAD to Mid LAD lesion, 0 %  stenosed.  Ost Cx to Prox Cx lesion, 90 %stenosed.  Ost LM to LM lesion, 40 %stenosed.  Ost RCA to Prox RCA lesion, 100 %stenosed.  RIMA and is normal in caliber and anatomically normal.  The left ventricular systolic function is normal.  LV end diastolic pressure is normal.  The left ventricular ejection fraction is 55-65% by visual estimate.  Patient Profile     77 y.o. male with HTN, CAD (CABG 1991, PCI 2005), PVD s/p R CEA, HTN, HLD, and PAF, admitted for Tikosyn initiation.  Assessment & Plan    1. Persistent AFib     CHA2DS2Vasc is 4, on Eliquis, appropriately dosed     K+ 3.9, replacement ordered     Mag 2.0      Creat 0.91, stable     QTc is OK  DCCV tomorrow if not in SR  2. CAD     No anginal c/o     Continue home meds  3. HTN     continue home regime  For questions or updates, please contact [facility] Please  consult [email or web address] for contact info under        Signed, Sheilah Pigeon, PA-C  08/13/2018, 7:31 AM    EP Attending  Patient seen and examined. Agree with above. The patient is doing well this morning. He is still in atrial fib after a single dose of drug. His QT is ok. We will follow his electrolytes with plans for DCCV on Wednesday if he has not reverted back to NSR.  Leonia Reeves.D.

## 2018-08-13 NOTE — Progress Notes (Signed)
Pt converted to SR at 1506. HR 60 bpm. EKG performed.

## 2018-08-13 NOTE — Care Management (Signed)
#    5 S/W  NIKIA  @ [facility] #  [phone]   1. TIKOSYN   125 MCG   250 MCG   500 MCG BID COVER- NONE FORMULARY PRIOR APPROVAL- YES  # [phone] FOR EXCEPTION  2. DOFETILIDE  125 MCG  BID COVER- YES CO-PAY- 25 % OF TOTAL COST  OF $10.00 TO $ 25.00 TIER- 1  DRUG PRIOR APPROVAL- NO  3. DOFETILIDE 250 MCG BID COVER- YES CO-PAY- 25 % OF TOTAL COST  $ 10.00 TO $ 25 .00 TIER- 1 DRUG PRIOR APPROVAL- NO  4 DOFETILIDE 500 MCG BID COVER- YES CO-PAY- 25 % OF TOTAL COST  $ 10.00 TO $ 25.00 TIER- 1 DRUG PRIOR APPROVAL- NO  PREFERRED PHARMACY : YES [facility]  M/O FOR 90 DAY SUPPLY  $ 10.00 TO $25.00

## 2018-08-14 ENCOUNTER — Ambulatory Visit (HOSPITAL_COMMUNITY): Admission: RE | Admit: 2018-08-14 | Payer: Medicare Other | Source: Ambulatory Visit | Admitting: Cardiology

## 2018-08-14 ENCOUNTER — Encounter (HOSPITAL_COMMUNITY)
Admission: AD | Disposition: A | Discharge status: Home / Self Care | Payer: Self-pay | Source: Ambulatory Visit | Attending: Internal Medicine

## 2018-08-14 LAB — BASIC METABOLIC PANEL
Anion gap: 5 (ref 5–15)
BUN: 20 mg/dL (ref 8–23)
CO2: 25 mmol/L (ref 22–32)
CREATININE: 0.92 mg/dL (ref 0.61–1.24)
Calcium: 8.5 mg/dL — ABNORMAL LOW (ref 8.9–10.3)
Chloride: 106 mmol/L (ref 98–111)
Glucose, Bld: 108 mg/dL — ABNORMAL HIGH (ref 70–99)
Potassium: 3.8 mmol/L (ref 3.5–5.1)
Sodium: 136 mmol/L (ref 135–145)

## 2018-08-14 LAB — MAGNESIUM: MAGNESIUM: 2 mg/dL (ref 1.7–2.4)

## 2018-08-14 SURGERY — CARDIOVERSION
Anesthesia: General

## 2018-08-14 MED ORDER — POTASSIUM CHLORIDE CRYS ER 20 MEQ PO TBCR
40.0000 meq | EXTENDED_RELEASE_TABLET | Freq: Once | ORAL | Status: AC
  Administered 2018-08-14: 40 meq via ORAL
  Filled 2018-08-14: qty 2

## 2018-08-14 NOTE — Progress Notes (Addendum)
Progress Note  Patient Name: Kevin Gonzales Date of Encounter: 08/14/2018  Primary Cardiologist: Dr. Allyson Sabal  Subjective   Slept better last night, otherwise no complaints, feels well, just waking this morning  Inpatient Medications    Scheduled Meds: . apixaban  5 mg Oral BID  . aspirin EC  81 mg Oral Daily  . atenolol  12.5 mg Oral QHS  . budesonide (PULMICORT) nebulizer solution  0.25 mg Nebulization BID  . dofetilide  500 mcg Oral BID  . finasteride  5 mg Oral Daily  . losartan  50 mg Oral Daily  . omega-3 acid ethyl esters  1 g Oral Daily  . pantoprazole  40 mg Oral Daily  . pneumococcal 23 valent vaccine  0.5 mL Intramuscular Tomorrow-1000  . pravastatin  10 mg Oral QHS  . sodium chloride flush  3 mL Intravenous Q12H  . sodium chloride flush  3 mL Intravenous Q12H   Continuous Infusions: . sodium chloride    . sodium chloride Stopped (08/13/18 0818)   PRN Meds: sodium chloride, hydrocortisone cream, ibuprofen, nitroGLYCERIN, sodium chloride flush, sodium chloride flush   Vital Signs    Vitals:   08/13/18 1300 08/13/18 1449 08/13/18 2001 08/14/18 0622  BP: (!) 147/76 128/76 134/61 128/64  Pulse: 68 66  (!) 55  Resp: 16  16 18   Temp: 98.1 F (36.7 C) 98.8 F (37.1 C) 97.8 F (36.6 C) 97.6 F (36.4 C)  TempSrc: Oral Oral Oral Oral  SpO2:  99% 99% 96%  Weight: 99.4 kg   98.4 kg  Height: 5\' 8"  (1.727 m)       Intake/Output Summary (Last 24 hours) at 08/14/2018 0839 Last data filed at 08/13/2018 2001 Gross per 24 hour  Intake 840 ml  Output -  Net 840 ml   Filed Weights   08/13/18 0500 08/13/18 1300 08/14/18 0622  Weight: 99.4 kg 99.4 kg 98.4 kg    Telemetry    AFib CVR >>> SB>SR 60's - Personally Reviewed  ECG    SR 64, QTc is OK - Personally Reviewed  Physical Exam   GEN: No acute distress.   Neck: No JVD Cardiac: RRR, no murmurs, rubs, or gallops.  Respiratory: CTA b/l GI: Soft, nontender, non-distended  MS: No edema; No  deformity. Neuro:  Nonfocal  Psych: Normal affect   Labs    Chemistry Recent Labs  Lab 08/12/18 1148 08/12/18 1726 08/13/18 0348 08/14/18 0354  NA 141  --  139 136  K 3.8 4.1 3.9 3.8  CL 109  --  110 106  CO2 26  --  24 25  GLUCOSE 119*  --  107* 108*  BUN 28*  --  25* 20  CREATININE 0.98  --  0.91 0.92  CALCIUM 9.3  --  8.4* 8.5*  GFRNONAA >60  --  >60 >60  GFRAA >60  --  >60 >60  ANIONGAP 6  --  5 5     HematologyNo results for input(s): WBC, RBC, HGB, HCT, MCV, MCH, MCHC, RDW, PLT in the last 168 hours.  Cardiac EnzymesNo results for input(s): TROPONINI in the last 168 hours. No results for input(s): TROPIPOC in the last 168 hours.   BNPNo results for input(s): BNP, PROBNP in the last 168 hours.   DDimer No results for input(s): DDIMER in the last 168 hours.   Radiology    No results found.  Cardiac Studies   07/12/18; TTE Study Conclusions - Left ventricle: The cavity size was  normal. There was mild concentric hypertrophy. Systolic function was normal. The estimated ejection fraction was in the range of 60% to 65%. Wall motion was normal; there were no regional wall motion abnormalities. - Aortic valve: Moderately to severely calcified annulus. Trileaflet; mildly thickened, mildly calcified leaflets. - Mitral valve: Calcified annulus. - Left atrium: The atrium was mildly dilated. - Tricuspid valve: There was mild regurgitation. - Pulmonary arteries: Systolic pressure could not be accurately estimated.  05/10/17: LHC  Ost LAD to Mid LAD lesion, 0 %stenosed.  Ost Cx to Prox Cx lesion, 90 %stenosed.  Ost LM to LM lesion, 40 %stenosed.  Ost RCA to Prox RCA lesion, 100 %stenosed.  RIMA and is normal in caliber and anatomically normal.  The left ventricular systolic function is normal.  LV end diastolic pressure is normal.  The left ventricular ejection fraction is 55-65% by visual estimate.  Patient Profile     77 y.o. male with  HTN, CAD (CABG 1991, PCI 2005), PVD s/p R CEA, HTN, HLD, and PAF, admitted for Tikosyn initiation.  Assessment & Plan    1. Persistent AFib     CHA2DS2Vasc is 4, on Eliquis, appropriately dosed     K+ 3.8, replacement ordered     Mag 2.0      Creat 0.92, stable     QTc is OK  2. CAD     No anginal c/o     Continue home meds  3. HTN     continue home regime    For questions or updates, please contact [facility] Please consult [email or web address] for contact info under        Signed, Sheilah Pigeon, PA-C  08/14/2018, 8:39 AM    EP Attending  Patient seen and examined. Agree with above. The patient has reverted back to NSR on dofetilide. His QT is ok. No VT. He will continue his current dose of dofetilide. DC tomorrow if stable.  Leonia Reeves.D.

## 2018-08-14 NOTE — Plan of Care (Signed)
  Problem: Education: Goal: Knowledge of General Education information will improve Description Including pain rating scale, medication(s)/side effects and non-pharmacologic comfort measures Outcome: Progressing   Problem: Clinical Measurements: Goal: Respiratory complications will improve Outcome: Progressing   Problem: Activity: Goal: Risk for activity intolerance will decrease Outcome: Progressing   Problem: Coping: Goal: Level of anxiety will decrease Outcome: Progressing   Problem: Pain Managment: Goal: General experience of comfort will improve Outcome: Progressing   Problem: Safety: Goal: Ability to remain free from injury will improve Outcome: Progressing   Problem: Education: Goal: Knowledge of disease or condition will improve Outcome: Progressing Goal: Understanding of medication regimen will improve Outcome: Progressing   Problem: Activity: Goal: Ability to tolerate increased activity will improve Outcome: Progressing

## 2018-08-14 NOTE — Care Management Note (Signed)
Case Management Note  Patient Details  Name: Kevin Gonzales MRN: [id number] Date of Birth: 06/29/1941  Subjective/Objective:  Pt presented for Tikosyn Load. PTA independent from home. Plan to transition home 08-15-18.                  Action/Plan: Pt will utilize the [facility]- please e-scribe Rx for 14 day supply to [facility]. Pt will need additional Rx for Mail Order 90 day supply sent to [facility]. No further needs from CM at this time.    Expected Discharge Date:                  Expected Discharge Plan:  Home/Self Care  In-House Referral:  NA  Discharge planning Services  CM Consult, Medication Assistance  Post Acute Care Choice:  NA Choice offered to:  NA  DME Arranged:  N/A DME Agency:  NA  HH Arranged:  NA HH Agency:  NA  Status of Service:  Completed, signed off  If discussed at Long Length of Stay Meetings, dates discussed:    Additional Comments:  Gala Lewandowsky, RN 08/14/2018, 2:53 PM

## 2018-08-15 LAB — BASIC METABOLIC PANEL
ANION GAP: 5 (ref 5–15)
BUN: 22 mg/dL (ref 8–23)
CHLORIDE: 106 mmol/L (ref 98–111)
CO2: 27 mmol/L (ref 22–32)
CREATININE: 1.01 mg/dL (ref 0.61–1.24)
Calcium: 8.8 mg/dL — ABNORMAL LOW (ref 8.9–10.3)
GFR calc non Af Amer: >60 mL/min (ref >60)
Glucose, Bld: 102 mg/dL — ABNORMAL HIGH (ref 70–99)
POTASSIUM: 4 mmol/L (ref 3.5–5.1)
SODIUM: 138 mmol/L (ref 135–145)

## 2018-08-15 LAB — MAGNESIUM: Magnesium: 2.1 mg/dL (ref 1.7–2.4)

## 2018-08-15 MED ORDER — DOFETILIDE 500 MCG PO CAPS: 500.0000 ug | ORAL_CAPSULE | Freq: Two times a day (BID) | ORAL | 2 refills | Status: DC

## 2018-08-15 MED ORDER — POTASSIUM CHLORIDE ER 20 MEQ PO TBCR: 20.0000 meq | EXTENDED_RELEASE_TABLET | Freq: Every day | ORAL | 3 refills | Status: DC

## 2018-08-15 MED ORDER — POTASSIUM CHLORIDE ER 20 MEQ PO TBCR: 20.0000 meq | EXTENDED_RELEASE_TABLET | Freq: Every day | ORAL | 0 refills | Status: DC

## 2018-08-15 MED ORDER — DOFETILIDE 500 MCG PO CAPS: 500.0000 ug | ORAL_CAPSULE | Freq: Two times a day (BID) | ORAL | 0 refills | Status: DC

## 2018-08-15 MED FILL — POTASSIUM CL ER 20 MEQ TABL: 20 | 30 days supply | Qty: 30 | Fill #0

## 2018-08-15 MED FILL — TIKOSYN 500 MCG CAPS: 500 | 14 days supply | Qty: 28 | Fill #0

## 2018-08-15 NOTE — Care Management Important Message (Signed)
Important Message  Patient Details  Name: Kevin Gonzales MRN: [id number] Date of Birth: June 16, 1941   Medicare Important Message Given:  Yes    Tedra Coppernoll P Dewana Ammirati 08/15/2018, 1:23 PM

## 2018-08-15 NOTE — Discharge Summary (Addendum)
ELECTROPHYSIOLOGY PROCEDURE DISCHARGE SUMMARY    Patient ID: Kevin Gonzales,  MRN: [id number], DOB/AGE: [birth year] 77 y.o.  Admit date: 08/12/2018 Discharge date: 08/15/2018  Primary Care Physician: Karle Plumber, MD  Primary Cardiologist: Dr. Allyson Sabal Electrophysiologist: new, Dr. Ladona Ridgel this admission  Primary Discharge Diagnosis:  1.  Persistent atrial fibrillation status post Tikosyn loading this admission      CHA2DS2Vasc is 4, on Eliquis  Secondary Discharge Diagnosis:  1. CAD 2. HTN 3. PVD  Allergies  Allergen Reactions  . Perflutren Lipid Microsphere Other (See Comments)    Severe lower back, tailbone, and butt pain. Minor chest pain     Procedures This Admission:  1.  Tikosyn loading  Brief HPI: Kevin Gonzales is a 77 y.o. male with a past medical history as noted above.  The patient was referred to the AFib clinic for AFib management, decided to pursue AAD with Tikosyn.  Risks, benefits, and alternatives to Tikosyn were reviewed with the patient who wished to proceed.    Hospital Course:  The patient was admitted and Tikosyn was initiated.  Renal function and electrolytes were followed during the hospitalization.  Their QTc remained stable.  The patient converted to SR with drug and did  They were monitored until discharge on telemetry which demonstrated SB/SR, occ PACs. He required potassium supplementation during his stay and will be discharged with RX for potassium as well. On the day of discharge, they were examined by Dr Ladona Ridgel who considered them stable for discharge to home.  Follow-up has been arranged withThe AFib clinic in 1 week and with Dr Malva Cogan in 4 weeks.   Physical Exam: Vitals:   08/14/18 0622 08/14/18 1548 08/14/18 1955 08/15/18 0634  BP: 128/64 140/68 (!) 128/56 135/75  Pulse: (!) 55  70 (!) 52  Resp: 18  19 16   Temp: 97.6 F (36.4 C) 98.3 F (36.8 C) 98.1 F (36.7 C) 98.7 F (37.1 C)  TempSrc: Oral Oral Oral Oral  SpO2: 96% 97%  97% 97%  Weight: 98.4 kg   98.4 kg  Height:        GEN- The patient is well appearing, alert and oriented x 3 today.   HEENT: normocephalic, atraumatic; sclera clear, conjunctiva pink; hearing intact; oropharynx clear; neck supple, no JVP Lymph- no cervical lymphadenopathy Lungs- CTA b/l, normal work of breathing.  No wheezes, rales, rhonchi Heart- RRR, no murmurs, rubs or gallops, PMI not laterally displaced GI- soft, non-tender, non-distended Extremities- no clubbing, cyanosis, or edema MS- no significant deformity or atrophy Skin- warm and dry, no rash or lesion Psych- euthymic mood, full affect Neuro- strength and sensation are intact   Labs:   Lab Results  Component Value Date   WBC 6.0 07/19/2018   HGB 13.8 07/19/2018   HCT 42.5 07/19/2018   MCV 95.7 07/19/2018   PLT 206 07/19/2018    Recent Labs  Lab 08/15/18 0429  NA 138  K 4.0  CL 106  CO2 27  BUN 22  CREATININE 1.01  CALCIUM 8.8*  GLUCOSE 102*     Discharge Medications:  Allergies as of 08/15/2018      Reactions   Perflutren Lipid Microsphere Other (See Comments)   Severe lower back, tailbone, and butt pain. Minor chest pain      Medication List    TAKE these medications   apixaban 5 MG Tabs tablet Commonly known as:  ELIQUIS Take 1 tablet (5 mg total) by mouth 2 (two) times daily.  aspirin EC 81 MG tablet Take 81 mg by mouth daily.   atenolol 25 MG tablet Commonly known as:  TENORMIN Take 12.5 mg by mouth at bedtime.   beclomethasone 80 MCG/ACT inhaler Commonly known as:  QVAR Inhale 1 puff into the lungs 2 (two) times daily as needed (for shortness of breath).   dofetilide 500 MCG capsule Commonly known as:  TIKOSYN Take 1 capsule (500 mcg total) by mouth 2 (two) times daily.   finasteride 5 MG tablet Commonly known as:  PROSCAR Take 5 mg by mouth daily.   FISH OIL ULTRA 1400 MG Caps Take 1,400 mg by mouth daily.   HYDROcodone-acetaminophen 7.5-325 MG tablet Commonly known as:   NORCO Take 1 tablet by mouth every 6 (six) hours as needed (for pain.).   ibuprofen 200 MG tablet Commonly known as:  ADVIL,MOTRIN Take 200 mg by mouth daily as needed for moderate pain.   losartan 50 MG tablet Commonly known as:  COZAAR Take 50 mg by mouth daily.   meclizine 25 MG tablet Commonly known as:  ANTIVERT Take 25 mg by mouth 3 (three) times daily as needed for dizziness.   nitroGLYCERIN 0.4 MG SL tablet Commonly known as:  NITROSTAT Place 0.4 mg under the tongue every 5 (five) minutes as needed for chest pain.   omeprazole 20 MG capsule Commonly known as:  PRILOSEC Take 20 mg by mouth daily before breakfast.   Potassium Chloride ER 20 MEQ Tbcr Take 20 mEq by mouth daily.   pravastatin 10 MG tablet Commonly known as:  PRAVACHOL Take 10 mg by mouth at bedtime.   Vitamin D (Ergocalciferol) 50000 units Caps capsule Commonly known as:  DRISDOL Take 50,000 Units by mouth every Sunday.       Disposition:  Discharge Instructions    Diet - low sodium heart healthy   Complete by:  As directed    Increase activity slowly   Complete by:  As directed      Follow-up Information    Marinus Maw, MD Follow up on 09/13/2018.   Specialty:  Cardiology Why:  4:30PM Contact information: [address] [place] [address] [phone]        [facility] Follow up on 08/22/2018.   Specialty:  Cardiology Why:  11:00AM Contact information: [address] [id number] [address]he[address] Bon[address] [place] [address] [phone]          Duration of Discharge Encounter: Greater than 30 minutes including physician time.  Norma Fredrickson, PA-C 08/15/2018 12:38 PM   EP attending  Patient seen and examined.  He continues to maintain sinus rhythm on dofetilide.  His QT interval is acceptable.  He will be discharged home with usual follow-up.  Lewayne Bunting, MD

## 2018-08-15 NOTE — Progress Notes (Addendum)
Telemetry reviewed SB/SR, occ PACs EKG reviewed with Dr. Ladona Ridgel, QTc stable Dr. Ladona Ridgel reviewed medication safety/importance of timining/compliance BMET looks OK patiemt tolerating med Anticipate discharge this afternoon  Francis Dowse, PAc

## 2018-08-20 ENCOUNTER — Ambulatory Visit (HOSPITAL_COMMUNITY): Payer: Medicare Other | Admitting: Nurse Practitioner

## 2018-08-22 ENCOUNTER — Ambulatory Visit (HOSPITAL_COMMUNITY)
Admission: RE | Admit: 2018-08-22 | Discharge: 2018-08-22 | Disposition: A | Discharge status: Home / Self Care | Payer: Medicare Other | Source: Ambulatory Visit | Attending: Nurse Practitioner | Admitting: Nurse Practitioner

## 2018-08-22 VITALS — BP 132/58 | HR 55 | Ht 68.0 in | Wt 223.0 lb

## 2018-08-22 DIAGNOSIS — Z87891 Personal history of nicotine dependence: Secondary | ICD-10-CM | POA: Insufficient documentation

## 2018-08-22 DIAGNOSIS — M1990 Unspecified osteoarthritis, unspecified site: Secondary | ICD-10-CM | POA: Diagnosis not present

## 2018-08-22 DIAGNOSIS — I10 Essential (primary) hypertension: Secondary | ICD-10-CM | POA: Insufficient documentation

## 2018-08-22 DIAGNOSIS — I252 Old myocardial infarction: Secondary | ICD-10-CM | POA: Diagnosis not present

## 2018-08-22 DIAGNOSIS — K219 Gastro-esophageal reflux disease without esophagitis: Secondary | ICD-10-CM | POA: Diagnosis not present

## 2018-08-22 DIAGNOSIS — I4891 Unspecified atrial fibrillation: Secondary | ICD-10-CM | POA: Insufficient documentation

## 2018-08-22 DIAGNOSIS — Z951 Presence of aortocoronary bypass graft: Secondary | ICD-10-CM | POA: Diagnosis not present

## 2018-08-22 DIAGNOSIS — Z79899 Other long term (current) drug therapy: Secondary | ICD-10-CM | POA: Diagnosis not present

## 2018-08-22 DIAGNOSIS — Z832 Family history of diseases of the blood and blood-forming organs and certain disorders involving the immune mechanism: Secondary | ICD-10-CM | POA: Diagnosis not present

## 2018-08-22 DIAGNOSIS — E559 Vitamin D deficiency, unspecified: Secondary | ICD-10-CM | POA: Diagnosis not present

## 2018-08-22 DIAGNOSIS — Z7901 Long term (current) use of anticoagulants: Secondary | ICD-10-CM | POA: Insufficient documentation

## 2018-08-22 DIAGNOSIS — I2510 Atherosclerotic heart disease of native coronary artery without angina pectoris: Secondary | ICD-10-CM | POA: Insufficient documentation

## 2018-08-22 DIAGNOSIS — E079 Disorder of thyroid, unspecified: Secondary | ICD-10-CM | POA: Insufficient documentation

## 2018-08-22 DIAGNOSIS — I4819 Other persistent atrial fibrillation: Secondary | ICD-10-CM

## 2018-08-22 DIAGNOSIS — Z91041 Radiographic dye allergy status: Secondary | ICD-10-CM | POA: Diagnosis not present

## 2018-08-22 DIAGNOSIS — E785 Hyperlipidemia, unspecified: Secondary | ICD-10-CM | POA: Insufficient documentation

## 2018-08-22 DIAGNOSIS — Z7982 Long term (current) use of aspirin: Secondary | ICD-10-CM | POA: Insufficient documentation

## 2018-08-22 LAB — BASIC METABOLIC PANEL
ANION GAP: 7 (ref 5–15)
BUN: 15 mg/dL (ref 8–23)
CALCIUM: 8.9 mg/dL (ref 8.9–10.3)
CO2: 27 mmol/L (ref 22–32)
CREATININE: 0.9 mg/dL (ref 0.61–1.24)
Chloride: 105 mmol/L (ref 98–111)
Glucose, Bld: 137 mg/dL — ABNORMAL HIGH (ref 70–99)
Potassium: 4.1 mmol/L (ref 3.5–5.1)
SODIUM: 139 mmol/L (ref 135–145)

## 2018-08-22 LAB — MAGNESIUM: MAGNESIUM: 2.1 mg/dL (ref 1.7–2.4)

## 2018-08-22 NOTE — Progress Notes (Signed)
Primary Care Physician: Karle Plumber, MD Referring Physician: Dr. Morrie Sheldon is a 77 y.o. male with a h/o CAD S/p CABG, HTN, that is being evaluated in the afib clinic for evaluation of new onset afib. Per pt this was found at the time of his rt endarterectomy  03/29/18. He has been  rate controlled since  then. He was placed on anticoagulation in  May.Echo in August showed EF at 60-65%. He has noted some fatigue since his surgery.  F/u in afib clinic,9/19, he opted to go for cardioversion alone and was successful. However, it returned within 24 hours, but for those 24 hours he felt much better. He is now willing to consider antiarrythmic's.  He did decide on Tikosyn and had successful load last week.   He is here now for one week f/u Tikosyn, He is in SR and feels improved, more energy and less shortness of breath.  Today, he denies symptoms of palpitations, chest pain, shortness of breath, orthopnea, PND, lower extremity edema, dizziness, presyncope, syncope, or neurologic sequela. Improved shortness of breath and energy in SR.The patient is tolerating medications without difficulties and is otherwise without complaint today.   Past Medical History:  Diagnosis Date  . Abdominal obesity   . Alcohol abuse   . Arthritis   . Atrial fibrillation (HCC)   . Carotid artery occlusion   . Carotid bruit   . Chest pain   . Chondrodermatitis nodularis helicis, left   . Coronary artery disease   . GERD (gastroesophageal reflux disease)   . HOH (hard of hearing)   . Hyperlipidemia   . Hypertension   . Myocardial infarction (HCC) 1991  . Thyroid disease   . Vitamin D deficiency    Past Surgical History:  Procedure Laterality Date  . CARDIOVERSION N/A 07/25/2018   Procedure: CARDIOVERSION;  Surgeon: Thurmon Fair, MD;  Location: [facility];  Service: Cardiovascular;  Laterality: N/A;  . CAROTID ENDARTERECTOMY Right 03/29/2018  . CAROTID STENT  2004  . CORONARY ARTERY  BYPASS GRAFT     1 vessel  . ENDARTERECTOMY Right 03/29/2018   Procedure: Right Carotid Endartarectomy;  Surgeon: Larina Earthly, MD;  Location: [facility] OR;  Service: Vascular;  Laterality: Right;  . LEFT HEART CATH AND CORS/GRAFTS ANGIOGRAPHY N/A 05/10/2017   Procedure: Left Heart Cath and Cors/Grafts Angiography;  Surgeon: Runell Gess, MD;  Location: [facility] INVASIVE CV LAB;  Service: Cardiovascular;  Laterality: N/A;  . PATCH ANGIOPLASTY Right 03/29/2018   Procedure: PATCH ANGIOPLASTY;  Surgeon: Larina Earthly, MD;  Location: [facility] OR;  Service: Vascular;  Laterality: Right;    Current Outpatient Medications  Medication Sig Dispense Refill  . apixaban (ELIQUIS) 5 MG TABS tablet Take 1 tablet (5 mg total) by mouth 2 (two) times daily. 60 tablet 2  . aspirin EC 81 MG tablet Take 81 mg by mouth daily.    Marland Kitchen atenolol (TENORMIN) 25 MG tablet Take 12.5 mg by mouth at bedtime.     . beclomethasone (QVAR) 80 MCG/ACT inhaler Inhale 1 puff into the lungs 2 (two) times daily as needed (for shortness of breath).     . dofetilide (TIKOSYN) 500 MCG capsule Take 1 capsule (500 mcg total) by mouth 2 (two) times daily. 180 capsule 2  . finasteride (PROSCAR) 5 MG tablet Take 5 mg by mouth daily.    Marland Kitchen HYDROcodone-acetaminophen (NORCO) 7.5-325 MG tablet Take 1 tablet by mouth every 6 (six) hours as needed (for pain.). 8  tablet 0  . ibuprofen (ADVIL,MOTRIN) 200 MG tablet Take 200 mg by mouth daily as needed for moderate pain.    Marland Kitchen losartan (COZAAR) 50 MG tablet Take 50 mg by mouth daily.    . meclizine (ANTIVERT) 25 MG tablet Take 25 mg by mouth 3 (three) times daily as needed for dizziness.    . Omega-3 Fatty Acids (FISH OIL ULTRA) 1400 MG CAPS Take 1,400 mg by mouth daily.    Marland Kitchen omeprazole (PRILOSEC) 20 MG capsule Take 20 mg by mouth daily before breakfast.     . potassium chloride 20 MEQ TBCR Take 20 mEq by mouth daily. 90 tablet 3  . pravastatin (PRAVACHOL) 10 MG tablet Take 10 mg by mouth at bedtime.    . Vitamin D,  Ergocalciferol, (DRISDOL) 50000 units CAPS capsule Take 50,000 Units by mouth every Sunday.     . nitroGLYCERIN (NITROSTAT) 0.4 MG SL tablet Place 0.4 mg under the tongue every 5 (five) minutes as needed for chest pain.     No current facility-administered medications for this encounter.     Allergies  Allergen Reactions  . Perflutren Lipid Microsphere Other (See Comments)    Severe lower back, tailbone, and butt pain. Minor chest pain    Social History   Socioeconomic History  . Marital status: Married    Spouse name: Not on file  . Number of children: Not on file  . Years of education: Not on file  . Highest education level: Not on file  Occupational History  . Not on file  Social Needs  . Financial resource strain: Not on file  . Food insecurity:    Worry: Not on file    Inability: Not on file  . Transportation needs:    Medical: Not on file    Non-medical: Not on file  Tobacco Use  . Smoking status: Former Smoker    Types: Cigarettes    Last attempt to quit: 1991    Years since quitting: 28.7  . Smokeless tobacco: Never Used  Substance and Sexual Activity  . Alcohol use: Not Currently  . Drug use: No  . Sexual activity: Not Currently  Lifestyle  . Physical activity:    Days per week: Not on file    Minutes per session: Not on file  . Stress: Not on file  Relationships  . Social connections:    Talks on phone: Not on file    Gets together: Not on file    Attends religious service: Not on file    Active member of club or organization: Not on file    Attends meetings of clubs or organizations: Not on file    Relationship status: Not on file  . Intimate partner violence:    Fear of current or ex partner: Not on file    Emotionally abused: Not on file    Physically abused: Not on file    Forced sexual activity: Not on file  Other Topics Concern  . Not on file  Social History Narrative  . Not on file    Family History  Problem Relation Age of Onset  .  Clotting disorder Father   . Clotting disorder Sister     ROS- All systems are reviewed and negative except as per the HPI above  Physical Exam: Vitals:   08/22/18 1040  BP: (!) 132/58  Pulse: (!) 55  Weight: 101.2 kg  Height: 5\' 8"  (1.727 m)   Wt Readings from Last 3 Encounters:  08/22/18  101.2 kg  08/15/18 98.4 kg  08/12/18 98.4 kg    Labs: Lab Results  Component Value Date   NA 138 08/15/2018   K 4.0 08/15/2018   CL 106 08/15/2018   CO2 27 08/15/2018   GLUCOSE 102 (H) 08/15/2018   BUN 22 08/15/2018   CREATININE 1.01 08/15/2018   CALCIUM 8.8 (L) 08/15/2018   MG 2.1 08/15/2018   Lab Results  Component Value Date   INR 1.07 03/26/2018   No results found for: CHOL, HDL, LDLCALC, TRIG   GEN- The patient is well appearing, alert and oriented x 3 today.   Head- normocephalic, atraumatic Eyes-  Sclera clear, conjunctiva pink Ears- hearing intact Oropharynx- clear Neck- supple, no JVP Lymph- no cervical lymphadenopathy Lungs- Clear to ausculation bilaterally, normal work of breathing Heart-slow regular rate and rhythm, no murmurs, rubs or gallops, PMI not laterally displaced GI- soft, NT, ND, + BS Extremities- no clubbing, cyanosis, or edema MS- no significant deformity or atrophy Skin- no rash or lesion Psych- euthymic mood, full affect Neuro- strength and sensation are intact  EKG- SR at 55 Bpm,  Pr int 172 ms, qrs int 72 ms, qtc 417 ms Echo-Study Conclusions  - Left ventricle: The cavity size was normal. There was mild   concentric hypertrophy. Systolic function was normal. The   estimated ejection fraction was in the range of 60% to 65%. Wall   motion was normal; there were no regional wall motion   abnormalities. - Aortic valve: Moderately to severely calcified annulus.   Trileaflet; mildly thickened, mildly calcified leaflets. - Mitral valve: Calcified annulus. - Left atrium: The atrium was mildly dilated. - Tricuspid valve: There was mild  regurgitation. - Pulmonary arteries: Systolic pressure could not be accurately   estimated.    Assessment and Plan: 1. New onset afib May 2019 General  education re afib, ERAF after successful cardioversion and need for antiarrythmic's to try to restore SR Discussion with pt to restore SR, he chose tikosyn HE is now one week s/p load of drug  Qtc is stable, he is in SR and feels improved He does not use benadryl Precautions with drug use reviewed bmet/mag  2. CHA2DS2VASc score of  4 He will continue eliquis 5 mg bid,he states no missed doses for at least 3 weeks  3. CAD No ischemic symptoms  4. HTN Stable     Donna C[facility] [address] [place], [place] [address] [phone]

## 2018-08-28 ENCOUNTER — Telehealth (HOSPITAL_COMMUNITY): Payer: Self-pay | Admitting: *Deleted

## 2018-08-28 NOTE — Telephone Encounter (Signed)
Patient approved for tikosyn patient assistance through 11/12/18. Patient ID [id number]

## 2018-08-30 ENCOUNTER — Other Ambulatory Visit: Payer: Self-pay | Admitting: Physician Assistant

## 2018-09-13 ENCOUNTER — Ambulatory Visit (INDEPENDENT_AMBULATORY_CARE_PROVIDER_SITE_OTHER): Payer: Medicare Other | Admitting: Internal Medicine

## 2018-09-13 ENCOUNTER — Encounter: Payer: Self-pay | Admitting: Internal Medicine

## 2018-09-13 VITALS — BP 126/60 | HR 46 | Ht 68.0 in | Wt 218.0 lb

## 2018-09-13 DIAGNOSIS — I6523 Occlusion and stenosis of bilateral carotid arteries: Secondary | ICD-10-CM

## 2018-09-13 DIAGNOSIS — I4819 Other persistent atrial fibrillation: Secondary | ICD-10-CM | POA: Diagnosis not present

## 2018-09-13 NOTE — Patient Instructions (Signed)
Medication Instructions:  Your physician recommends that you continue on your current medications as directed. Please refer to the Current Medication list given to you today.  If you need a refill on your cardiac medications before your next appointment, please call your pharmacy.   Lab work: None ordered.  If you have labs (blood work) drawn today and your tests are completely normal, you will receive your results only by: . MyChart Message (if you have MyChart) OR . A paper copy in the mail If you have any lab test that is abnormal or we need to change your treatment, we will call you to review the results.  Testing/Procedures: None ordered.  Follow-Up:  Your physician wants you to follow-up in: one year with Dr. Taylor.    You will receive a reminder letter in the mail two months in advance. If you don't receive a letter, please call our office to schedule the follow-up appointment.  At [facility], you and your health needs are our priority.  As part of our continuing mission to provide you with exceptional heart care, we have created designated Provider Care Teams.  These Care Teams include your primary Cardiologist (physician) and Advanced Practice Providers (APPs -  Physician Assistants and Nurse Practitioners) who all work together to provide you with the care you need, when you need it. . You may see Gregg Taylor, MD or one of the following Advanced Practice Providers on your designated Care Team:   . Amber Seiler, NP . Renee Ursuy, PA-C   Any Other Special Instructions Will Be Listed Below (If Applicable).    

## 2018-09-13 NOTE — Progress Notes (Signed)
HPI Kevin Gonzales returns today for followup of atrial fib, CAD, and sinus node dysfunction. He was admitted for initiation of dofetilide several weeks ago. He has done well in the interim with no chest pain. His dypsne has improved. No syncope. No edema. No palpitations. Allergies  Allergen Reactions  . Perflutren Lipid Microsphere Other (See Comments)    Severe lower back, tailbone, and butt pain. Minor chest pain     Current Outpatient Medications  Medication Sig Dispense Refill  . apixaban (ELIQUIS) 5 MG TABS tablet Take 1 tablet (5 mg total) by mouth 2 (two) times daily. 60 tablet 2  . aspirin EC 81 MG tablet Take 81 mg by mouth daily.    Marland Kitchen atenolol (TENORMIN) 25 MG tablet Take 12.5 mg by mouth at bedtime.     . beclomethasone (QVAR) 80 MCG/ACT inhaler Inhale 1 puff into the lungs 2 (two) times daily as needed (for shortness of breath).     . dofetilide (TIKOSYN) 500 MCG capsule Take 1 capsule (500 mcg total) by mouth 2 (two) times daily. 180 capsule 2  . finasteride (PROSCAR) 5 MG tablet Take 5 mg by mouth daily.    Marland Kitchen HYDROcodone-acetaminophen (NORCO) 7.5-325 MG tablet Take 1 tablet by mouth every 6 (six) hours as needed (for pain.). 8 tablet 0  . ibuprofen (ADVIL,MOTRIN) 200 MG tablet Take 200 mg by mouth daily as needed for moderate pain.    Marland Kitchen losartan (COZAAR) 50 MG tablet Take 50 mg by mouth daily.    . meclizine (ANTIVERT) 25 MG tablet Take 25 mg by mouth 3 (three) times daily as needed for dizziness.    . nitroGLYCERIN (NITROSTAT) 0.4 MG SL tablet Place 0.4 mg under the tongue every 5 (five) minutes as needed for chest pain.    . Omega-3 Fatty Acids (FISH OIL ULTRA) 1400 MG CAPS Take 1,400 mg by mouth daily.    Marland Kitchen omeprazole (PRILOSEC) 20 MG capsule Take 20 mg by mouth daily before breakfast.     . potassium chloride 20 MEQ TBCR Take 20 mEq by mouth daily. 90 tablet 3  . pravastatin (PRAVACHOL) 10 MG tablet Take 10 mg by mouth at bedtime.    . Vitamin D, Ergocalciferol,  (DRISDOL) 50000 units CAPS capsule Take 50,000 Units by mouth every Sunday.      No current facility-administered medications for this visit.      Past Medical History:  Diagnosis Date  . Abdominal obesity   . Alcohol abuse   . Arthritis   . Atrial fibrillation (HCC)   . Carotid artery occlusion   . Carotid bruit   . Chest pain   . Chondrodermatitis nodularis helicis, left   . Coronary artery disease   . GERD (gastroesophageal reflux disease)   . HOH (hard of hearing)   . Hyperlipidemia   . Hypertension   . Myocardial infarction (HCC) 1991  . Thyroid disease   . Vitamin D deficiency     ROS:   All systems reviewed and negative except as noted in the HPI.   Past Surgical History:  Procedure Laterality Date  . CARDIOVERSION N/A 07/25/2018   Procedure: CARDIOVERSION;  Surgeon: Thurmon Fair, MD;  Location: [facility];  Service: Cardiovascular;  Laterality: N/A;  . CAROTID ENDARTERECTOMY Right 03/29/2018  . CAROTID STENT  2004  . CORONARY ARTERY BYPASS GRAFT     1 vessel  . ENDARTERECTOMY Right 03/29/2018   Procedure: Right Carotid Endartarectomy;  Surgeon: Larina Earthly, MD;  Location: [facility] OR;  Service: Vascular;  Laterality: Right;  . LEFT HEART CATH AND CORS/GRAFTS ANGIOGRAPHY N/A 05/10/2017   Procedure: Left Heart Cath and Cors/Grafts Angiography;  Surgeon: Runell Gess, MD;  Location: [facility] INVASIVE CV LAB;  Service: Cardiovascular;  Laterality: N/A;  . PATCH ANGIOPLASTY Right 03/29/2018   Procedure: PATCH ANGIOPLASTY;  Surgeon: Larina Earthly, MD;  Location: [facility] OR;  Service: Vascular;  Laterality: Right;     Family History  Problem Relation Age of Onset  . Clotting disorder Father   . Clotting disorder Sister      Social History   Socioeconomic History  . Marital status: Married    Spouse name: Not on file  . Number of children: Not on file  . Years of education: Not on file  . Highest education level: Not on file  Occupational History  . Not on file    Social Needs  . Financial resource strain: Not on file  . Food insecurity:    Worry: Not on file    Inability: Not on file  . Transportation needs:    Medical: Not on file    Non-medical: Not on file  Tobacco Use  . Smoking status: Former Smoker    Types: Cigarettes    Last attempt to quit: 1991    Years since quitting: 28.8  . Smokeless tobacco: Never Used  Substance and Sexual Activity  . Alcohol use: Not Currently  . Drug use: No  . Sexual activity: Not Currently  Lifestyle  . Physical activity:    Days per week: Not on file    Minutes per session: Not on file  . Stress: Not on file  Relationships  . Social connections:    Talks on phone: Not on file    Gets together: Not on file    Attends religious service: Not on file    Active member of club or organization: Not on file    Attends meetings of clubs or organizations: Not on file    Relationship status: Not on file  . Intimate partner violence:    Fear of current or ex partner: Not on file    Emotionally abused: Not on file    Physically abused: Not on file    Forced sexual activity: Not on file  Other Topics Concern  . Not on file  Social History Narrative  . Not on file     BP 126/60   Pulse (!) 46   Ht 5\' 8"  (1.727 m)   Wt 218 lb (98.9 kg)   BMI 33.15 kg/m   Physical Exam:  Well appearing NAD HEENT: Unremarkable Neck:  No JVD, no thyromegally Lymphatics:  No adenopathy Back:  No CVA tenderness Lungs:  Clear HEART:  Regular rate rhythm, no murmurs, no rubs, no clicks Abd:  soft, positive bowel sounds, no organomegally, no rebound, no guarding Ext:  2 plus pulses, no edema, no cyanosis, no clubbing Skin:  No rashes no nodules Neuro:  CN II through XII intact, motor grossly intact  EKG - sinus brady with PAC's   Assess/Plan: 1. PAF - he is maintaining NSR and feels well. He will continue dofetilide. QT is ok. 2. Sinus node dysfunction - he could not tolerate 25 mg of atenolol but appears to  be ok with 12.5 daily despite his resting bradycardia.  3. CAD - he denies anginal symptoms.  Leonia Reeves.D.

## 2018-11-20 ENCOUNTER — Telehealth: Payer: Self-pay

## 2018-11-20 NOTE — Telephone Encounter (Signed)
   [facility] Pre-operative Risk Assessment    Request for surgical clearance:  1. What type of surgery is being performed? TOOTH EXTRACTION (3 TEETH)   2. When is this surgery scheduled? UNAVAILABLE; TBA   3. What type of clearance is required (medical clearance vs. Pharmacy clearance to hold med vs. Both)? BOTH  4. Are there any medications that need to be held prior to surgery and how long? ELIQUIS   5. Practice name and name of physician performing surgery? [facility]  6. What is your office phone number [phone]    7.   What is your office fax number [phone]  8.   Anesthesia type (None, local, MAC, general) ? LOCAL ANESTHESIA AND NITROUS OXIDE   Kevin Gonzales Kevin Gonzales Kevin Gonzales 11/20/2018, 3:46 PM  _________________________________________________________________   (provider comments below)

## 2018-11-21 NOTE — Telephone Encounter (Signed)
Pt takes Eliquis for afib with CHADS2VASc score of 4 (age x2, HTN, CAD). Renal function is normal. Ok to hold Eliquis for 1 day prior to dental extractions.

## 2018-11-25 NOTE — Telephone Encounter (Signed)
   Primary Cardiologist: Lewayne Bunting, MD  Chart reviewed as part of pre-operative protocol coverage. Simple dental extractions are considered low risk procedures per guidelines and generally do not require any specific cardiac clearance. It is also generally accepted that for simple extractions and dental cleanings, there is no need to interrupt blood thinner therapy. Sometimes the dentist might feel this is necessary if multiple teeth are required to be extracted at one time. In the event that the dentist feels that holding anticoagulation is necessary due to higher risk of bleeding, pharmacist has reviewed the chart and states, "Pt takes Eliquis for afib with CHADS2VASc score of 4 (age x2, HTN, CAD). Renal function is normal. Ok to hold Eliquis for 1 day prior to dental extractions."  SBE prophylaxis is not required for the patient.  I will route this recommendation to the requesting party via Epic fax function and remove from pre-op pool.  Please call with questions.  Laurann Montana, PA-C 11/25/2018, 2:24 PM

## 2018-12-17 ENCOUNTER — Other Ambulatory Visit: Payer: Self-pay

## 2018-12-17 ENCOUNTER — Emergency Department (HOSPITAL_BASED_OUTPATIENT_CLINIC_OR_DEPARTMENT_OTHER)
Admission: EM | Admit: 2018-12-17 | Discharge: 2018-12-17 | Disposition: A | Discharge status: Home / Self Care | Payer: Medicare Other | Attending: Emergency Medicine | Admitting: Emergency Medicine

## 2018-12-17 ENCOUNTER — Encounter (HOSPITAL_BASED_OUTPATIENT_CLINIC_OR_DEPARTMENT_OTHER): Payer: Self-pay | Admitting: Emergency Medicine

## 2018-12-17 DIAGNOSIS — Z5321 Procedure and treatment not carried out due to patient leaving prior to being seen by health care provider: Secondary | ICD-10-CM | POA: Insufficient documentation

## 2018-12-17 DIAGNOSIS — L7622 Postprocedural hemorrhage and hematoma of skin and subcutaneous tissue following other procedure: Secondary | ICD-10-CM | POA: Insufficient documentation

## 2018-12-17 NOTE — ED Triage Notes (Signed)
Pt has 3 dental pieces removed last Thursday and he still bleeding on his right upper side, pt is on Eliquis and Aspirin.

## 2019-01-07 ENCOUNTER — Ambulatory Visit (INDEPENDENT_AMBULATORY_CARE_PROVIDER_SITE_OTHER): Payer: Medicare Other | Admitting: Cardiovascular Disease

## 2019-01-07 ENCOUNTER — Encounter: Payer: Self-pay | Admitting: Cardiovascular Disease

## 2019-01-07 DIAGNOSIS — I480 Paroxysmal atrial fibrillation: Secondary | ICD-10-CM

## 2019-01-07 DIAGNOSIS — I2510 Atherosclerotic heart disease of native coronary artery without angina pectoris: Secondary | ICD-10-CM | POA: Diagnosis not present

## 2019-01-07 DIAGNOSIS — E7800 Pure hypercholesterolemia, unspecified: Secondary | ICD-10-CM | POA: Diagnosis not present

## 2019-01-07 DIAGNOSIS — I10 Essential (primary) hypertension: Secondary | ICD-10-CM

## 2019-01-07 DIAGNOSIS — I6521 Occlusion and stenosis of right carotid artery: Secondary | ICD-10-CM

## 2019-01-07 NOTE — Assessment & Plan Note (Signed)
History of asymptomatic right carotid artery stenosis status post endarterectomy performed by Dr. Tawanna Cooler Early 03/29/2018.

## 2019-01-07 NOTE — Patient Instructions (Signed)

## 2019-01-07 NOTE — Progress Notes (Signed)
01/07/2019 Kevin Gonzales   29-Mar-1941  [id number]  Primary Physician Karle Plumber, MD Primary Cardiologist: Runell Gess MD Nicholes Calamity, [place]  HPI:  Kevin Gonzales is a 78 y.o.  mildly overweight married Caucasian male father of 2, grandfather one grandchild referred by Arnette Felts for cardiovascular evaluation and coronary angiography because of ongoing chest pain.I last saw him in the office  07/02/2018.   He has a history of treated hypertension, hyperlipidemia and remote tobacco abuse having quit in 1991. He had coronary artery bypass grafting times one in 1991 with a RIMA to his RCA by Dr. Arvilla Market at [facility]. I placed a 2.75 mm x 16 mm long Taxus drug-eluting stent in his proximal LAD 02/20/04.I performed cardiac catheterization on him 05/10/2017 revealing normal LV function, a patent RIMA to the RCA with with an occluded native RCA, patent stent to the proximal LAD which I placed in 2005 and high-grade ostial nondominant circumflex. Medical therapy was recommended. He recently had elective right carotid endarterectomy performed by Dr. Tawanna Cooler Early 03/29/2018 and had episodes of PAF in the perioperative period. He was seen by Dr. Jens Som who began him on Eliquis oral anticoagulation.  Since I saw him next months ago, he was admitted to the hospital 08/12/2018 for "Tikosyn load" and converted to sinus rhythm which he has maintained on Eliquis oral anticoagulation.  He denies chest pain or shortness of breath.   Current Meds  Medication Sig  . apixaban (ELIQUIS) 5 MG TABS tablet Take 1 tablet (5 mg total) by mouth 2 (two) times daily.  Marland Kitchen aspirin EC 81 MG tablet Take 81 mg by mouth daily.  Marland Kitchen atenolol (TENORMIN) 25 MG tablet Take 12.5 mg by mouth at bedtime.   . beclomethasone (QVAR) 80 MCG/ACT inhaler Inhale 1 puff into the lungs 2 (two) times daily as needed (for shortness of breath).   . dofetilide (TIKOSYN) 500 MCG capsule Take 1 capsule (500  mcg total) by mouth 2 (two) times daily.  . finasteride (PROSCAR) 5 MG tablet Take 5 mg by mouth daily.  Marland Kitchen HYDROcodone-acetaminophen (NORCO) 7.5-325 MG tablet Take 1 tablet by mouth every 6 (six) hours as needed (for pain.).  Marland Kitchen ibuprofen (ADVIL,MOTRIN) 200 MG tablet Take 200 mg by mouth daily as needed for moderate pain.  Marland Kitchen losartan (COZAAR) 50 MG tablet Take 50 mg by mouth daily.  . meclizine (ANTIVERT) 25 MG tablet Take 25 mg by mouth 3 (three) times daily as needed for dizziness.  . nitroGLYCERIN (NITROSTAT) 0.4 MG SL tablet Place 0.4 mg under the tongue every 5 (five) minutes as needed for chest pain.  . Omega-3 Fatty Acids (FISH OIL ULTRA) 1400 MG CAPS Take 1,400 mg by mouth daily.  Marland Kitchen omeprazole (PRILOSEC) 20 MG capsule Take 20 mg by mouth daily before breakfast.   . potassium chloride 20 MEQ TBCR Take 20 mEq by mouth daily.  . pravastatin (PRAVACHOL) 10 MG tablet Take 10 mg by mouth at bedtime.  . Vitamin D, Ergocalciferol, (DRISDOL) 50000 units CAPS capsule Take 50,000 Units by mouth every Sunday.      Allergies  Allergen Reactions  . Perflutren Lipid Microsphere Other (See Comments)    Severe lower back, tailbone, and butt pain. Minor chest pain    Social History   Socioeconomic History  . Marital status: Married    Spouse name: Not on file  . Number of children: Not on file  . Years of education: Not  on file  . Highest education level: Not on file  Occupational History  . Not on file  Social Needs  . Financial resource strain: Not on file  . Food insecurity:    Worry: Not on file    Inability: Not on file  . Transportation needs:    Medical: Not on file    Non-medical: Not on file  Tobacco Use  . Smoking status: Former Smoker    Types: Cigarettes    Last attempt to quit: 1991    Years since quitting: 29.1  . Smokeless tobacco: Never Used  Substance and Sexual Activity  . Alcohol use: Not Currently  . Drug use: No  . Sexual activity: Not Currently  Lifestyle    . Physical activity:    Days per week: Not on file    Minutes per session: Not on file  . Stress: Not on file  Relationships  . Social connections:    Talks on phone: Not on file    Gets together: Not on file    Attends religious service: Not on file    Active member of club or organization: Not on file    Attends meetings of clubs or organizations: Not on file    Relationship status: Not on file  . Intimate partner violence:    Fear of current or ex partner: Not on file    Emotionally abused: Not on file    Physically abused: Not on file    Forced sexual activity: Not on file  Other Topics Concern  . Not on file  Social History Narrative  . Not on file     Review of Systems: General: negative for chills, fever, night sweats or weight changes.  Cardiovascular: negative for chest pain, dyspnea on exertion, edema, orthopnea, palpitations, paroxysmal nocturnal dyspnea or shortness of breath Dermatological: negative for rash Respiratory: negative for cough or wheezing Urologic: negative for hematuria Abdominal: negative for nausea, vomiting, diarrhea, bright red blood per rectum, melena, or hematemesis Neurologic: negative for visual changes, syncope, or dizziness All other systems reviewed and are otherwise negative except as noted above.    Blood pressure 140/70, pulse 81, height 5\' 8"  (1.727 m), weight 221 lb 3.2 oz (100.3 kg).  General appearance: alert and no distress Neck: no adenopathy, no carotid bruit, no JVD, supple, symmetrical, trachea midline and thyroid not enlarged, symmetric, no tenderness/mass/nodules Lungs: clear to auscultation bilaterally Heart: regular rate and rhythm, S1, S2 normal, no murmur, click, rub or gallop Extremities: extremities normal, atraumatic, no cyanosis or edema Pulses: 2+ and symmetric Skin: Skin color, texture, turgor normal. No rashes or lesions Neurologic: Alert and oriented X 3, normal strength and tone. Normal symmetric reflexes.  Normal coordination and gait  EKG not performed today  ASSESSMENT AND PLAN:   Coronary artery disease History of CAD status post CABG x1 in 1991 by Dr. Arvilla Market at [facility] with a RIMA placed to the RCA.  I placed a Taxus 2.75 mm x 16 mm long drug-eluting stent in his proximal LAD 02/20/2004.  Cardiac cath performed 05/02/2017 by myself revealed a patent stent in the proximal LAD, high-grade ostial nondominant circumflex, patent RIMA to the RCA and occluded native RCA.  Medical therapy was recommended.  He denies chest pain or shortness of breath.  Carotid artery stenosis, asymptomatic, right History of asymptomatic right carotid artery stenosis status post endarterectomy performed by Dr. Tawanna Cooler Early 03/29/2018.  Paroxysmal atrial fibrillation (HCC) History of PAF maintaining sinus rhythm 08/12/2018  for "Tikosyn load".  He remains on Eliquis maintaining sinus rhythm.  Hypercholesterolemia History of hyperlipidemia on statin therapy followed by his PCP  Essential hypertension History of essential hypertension her blood pressure measured today 140/70.  He is on atenolol and losartan.      Runell Gess MD FACP,FACC,FAHA, [facility] 01/07/2019 4:39 PM

## 2019-01-07 NOTE — Assessment & Plan Note (Signed)
History of essential hypertension her blood pressure measured today 140/70.  He is on atenolol and losartan.

## 2019-01-07 NOTE — Assessment & Plan Note (Signed)
History of PAF maintaining sinus rhythm 08/12/2018 for "Tikosyn load".  He remains on Eliquis maintaining sinus rhythm.

## 2019-01-07 NOTE — Assessment & Plan Note (Signed)
History of hyperlipidemia on statin therapy followed by his PCP 

## 2019-01-07 NOTE — Assessment & Plan Note (Signed)
History of CAD status post CABG x1 in 1991 by Dr. Arvilla Market at [facility] with a RIMA placed to the RCA.  I placed a Taxus 2.75 mm x 16 mm long drug-eluting stent in his proximal LAD 02/20/2004.  Cardiac cath performed 05/02/2017 by myself revealed a patent stent in the proximal LAD, high-grade ostial nondominant circumflex, patent RIMA to the RCA and occluded native RCA.  Medical therapy was recommended.  He denies chest pain or shortness of breath.

## 2019-02-11 ENCOUNTER — Ambulatory Visit: Payer: Medicare Other | Admitting: Vascular Surgery

## 2019-02-11 ENCOUNTER — Encounter (HOSPITAL_COMMUNITY): Payer: Medicare Other

## 2019-05-01 ENCOUNTER — Other Ambulatory Visit: Payer: Self-pay

## 2019-05-01 DIAGNOSIS — I6523 Occlusion and stenosis of bilateral carotid arteries: Secondary | ICD-10-CM

## 2019-05-05 ENCOUNTER — Telehealth (HOSPITAL_COMMUNITY): Payer: Self-pay | Admitting: *Deleted

## 2019-05-05 NOTE — Telephone Encounter (Signed)
The above patient or their representative was contacted and gave the following answers to these questions:         Do you have any of the following symptoms?n  Fever                    Cough                   Shortness of breath  Do  you have any of the following other symptoms? n muscle pain         vomiting,        diarrhea        rash         weakness        red eye        abdominal pain         bruising          bruising or bleeding              joint pain           severe headache    Have you been in contact with someone who was or has been sick in the past 2 weeks?n  Yes                 Unsure                         Unable to assess   Does the person that you were in contact with have any of the following symptoms?   Cough         shortness of breath           muscle pain         vomiting,            diarrhea            rash            weakness           fever            red eye           abdominal pain           bruising  or  bleeding                joint pain                severe headache               Have you  or someone you have been in contact with traveled internationally in th last month?   n      If yes, which countries?   Have you  or someone you have been in contact with traveled outside [place] in th last month?         If yes, which state and city?   COMMENTS OR ACTION PLAN FOR THIS PATIENT:          

## 2019-05-06 ENCOUNTER — Other Ambulatory Visit: Payer: Self-pay

## 2019-05-06 ENCOUNTER — Encounter: Payer: Self-pay | Admitting: Vascular Surgery

## 2019-05-06 ENCOUNTER — Ambulatory Visit (HOSPITAL_COMMUNITY)
Admission: RE | Admit: 2019-05-06 | Discharge: 2019-05-06 | Disposition: A | Discharge status: Home / Self Care | Payer: Medicare Other | Source: Ambulatory Visit | Attending: Vascular Surgery | Admitting: Vascular Surgery

## 2019-05-06 ENCOUNTER — Ambulatory Visit (INDEPENDENT_AMBULATORY_CARE_PROVIDER_SITE_OTHER): Payer: Medicare Other | Admitting: Vascular Surgery

## 2019-05-06 VITALS — BP 134/68 | HR 64 | Temp 97.4°F | Resp 20 | Ht 68.0 in | Wt 211.0 lb

## 2019-05-06 DIAGNOSIS — I6523 Occlusion and stenosis of bilateral carotid arteries: Secondary | ICD-10-CM

## 2019-05-06 NOTE — Progress Notes (Signed)
Vascular and Vein Specialist of   Patient name: Kevin Gonzales MRN: [id number] DOB: [birth year] Sex: male  REASON FOR VISIT: Follow-up carotid occlusive disease  HPI: Kevin Gonzales is a 78 y.o. male here today for follow-up.  He underwent right carotid endarterectomy for severe asymptomatic carotid disease in May 2019.  He reports no new difficulty.  He has had no neurologic deficits.  He is on chronic anticoagulation with Eliquis related to his atrial fibrillation.  Past Medical History:  Diagnosis Date  . Abdominal obesity   . Alcohol abuse   . Arthritis   . Atrial fibrillation ([place])   . Carotid artery occlusion   . Carotid bruit   . Chest pain   . Chondrodermatitis nodularis helicis, left   . Coronary artery disease   . GERD (gastroesophageal reflux disease)   . HOH (hard of hearing)   . Hyperlipidemia   . Hypertension   . Myocardial infarction ([place]) 1991  . Thyroid disease   . Vitamin D deficiency     Family History  Problem Relation Age of Onset  . Clotting disorder Father   . Clotting disorder Sister     SOCIAL HISTORY: Social History   Tobacco Use  . Smoking status: Former Smoker    Types: Cigarettes    Quit date: 1991    Years since quitting: 29.4  . Smokeless tobacco: Never Used  Substance Use Topics  . Alcohol use: Not Currently    Allergies  Allergen Reactions  . Perflutren Lipid Microsphere Other (See Comments)    Severe lower back, tailbone, and butt pain. Minor chest pain    Current Outpatient Medications  Medication Sig Dispense Refill  . apixaban (ELIQUIS) 5 MG TABS tablet Take 1 tablet (5 mg total) by mouth 2 (two) times daily. 60 tablet 2  . aspirin EC 81 MG tablet Take 81 mg by mouth daily.    Marland Kitchen atenolol (TENORMIN) 25 MG tablet Take 12.5 mg by mouth at bedtime.     . beclomethasone (QVAR) 80 MCG/ACT inhaler Inhale 1 puff into the lungs 2 (two) times daily as needed (for shortness of  breath).     . dofetilide (TIKOSYN) 500 MCG capsule Take 1 capsule (500 mcg total) by mouth 2 (two) times daily. 180 capsule 2  . finasteride (PROSCAR) 5 MG tablet Take 5 mg by mouth daily.    Marland Kitchen HYDROcodone-acetaminophen (NORCO) 7.5-325 MG tablet Take 1 tablet by mouth every 6 (six) hours as needed (for pain.). 8 tablet 0  . ibuprofen (ADVIL,MOTRIN) 200 MG tablet Take 200 mg by mouth daily as needed for moderate pain.    Marland Kitchen losartan (COZAAR) 50 MG tablet Take 50 mg by mouth daily.    . meclizine (ANTIVERT) 25 MG tablet Take 25 mg by mouth 3 (three) times daily as needed for dizziness.    . nitroGLYCERIN (NITROSTAT) 0.4 MG SL tablet Place 0.4 mg under the tongue every 5 (five) minutes as needed for chest pain.    . Omega-3 Fatty Acids (FISH OIL ULTRA) 1400 MG CAPS Take 1,400 mg by mouth daily.    Marland Kitchen omeprazole (PRILOSEC) 20 MG capsule Take 20 mg by mouth daily before breakfast.     . potassium chloride 20 MEQ TBCR Take 20 mEq by mouth daily. 90 tablet 3  . pravastatin (PRAVACHOL) 10 MG tablet Take 10 mg by mouth at bedtime.    . Vitamin D, Ergocalciferol, (DRISDOL) 50000 units CAPS capsule Take 50,000 Units by mouth every Sunday.  No current facility-administered medications for this visit.     REVIEW OF SYSTEMS:  [X]  denotes positive finding, [ ]  denotes negative finding Cardiac  Comments:  Chest pain or chest pressure:    Shortness of breath upon exertion:    Short of breath when lying flat:    Irregular heart rhythm:        Vascular    Pain in calf, thigh, or hip brought on by ambulation:    Pain in feet at night that wakes you up from your sleep:     Blood clot in your veins:    Leg swelling:           PHYSICAL EXAM: Vitals:   05/06/19 1139 05/06/19 1141  BP: 132/66 134/68  Pulse: 64   Resp: 20   Temp: (!) 97.4 F (36.3 C)   SpO2: 95%   Weight: 95.7 kg   Height: 5\' 8"  (1.727 m)     GENERAL: The patient is a well-nourished male, in no acute distress. The vital signs  are documented above. CARDIOVASCULAR: Right neck incision is well-healed with no carotid bruits bilaterally PULMONARY: There is good air exchange  MUSCULOSKELETAL: There are no major deformities or cyanosis. NEUROLOGIC: No focal weakness or paresthesias are detected. SKIN: There are no ulcers or rashes noted. PSYCHIATRIC: The patient has a normal affect.  DATA:  Carotid duplex today reveals widely patent endarterectomy on the right and 40 to 59% stenosis in the left internal carotid artery  MEDICAL ISSUES: Stable overall.  No evidence of recurrent stenosis.  He does continue to follow-up with Arnette Felts, PA-C with yearly carotid duplex.  He will see [place] again on an as-needed basis    Kevin Earthly, MD [facility] Vascular and Vein Specialists of [facility] Tel [phone] Pager [phone]

## 2019-07-01 ENCOUNTER — Other Ambulatory Visit: Payer: Self-pay | Admitting: Physician Assistant

## 2019-08-22 ENCOUNTER — Telehealth: Payer: Self-pay

## 2019-08-22 NOTE — Telephone Encounter (Signed)
Request for surgical clearance:  1. What type of surgery is being performed? COLONSCOPY   2. When is this surgery scheduled?  TBD   3. What type of clearance is required (medical clearance vs. Pharmacy clearance to hold med vs. Both)? PHARMACY  4. Are there any medications that need to be held prior to surgery and how long? ELIQUIS   5. Practice name and name of physician performing surgery? [facility]   6. What is your office phone number [phone]    7.   What is your office fax number  [phone]  8.   Anesthesia type (None, local, MAC, general) ? NOT LISTED

## 2019-08-25 NOTE — Telephone Encounter (Signed)
   Primary Cardiologist: Cristopher Peru, MD  Chart reviewed as part of pre-operative protocol coverage. Given past medical history and time since last visit, based on ACC/AHA guidelines, Kevin Gonzales would be at acceptable risk for the planned procedure without further cardiovascular testing.   Per pharmacy: Patient with diagnosis of Atrial Fibrillation on Eliquis 5 mg BID for anticoagulation and CHADS2-VASc score of 4 (HTN, AGE 78 yrs, CAD)  Per office protocol, patient can hold Eliquis for 1-2 days prior to procedure.    I will route this recommendation to the requesting party via Epic fax function and remove from pre-op pool.  Please call with questions.  Kathyrn Drown, NP 08/25/2019, 3:42 PM

## 2019-08-25 NOTE — Telephone Encounter (Signed)
Patient with diagnosis of Atrial Fibrillation on Eliquis 5 mg BID for anticoagulation.    Procedure: Colonscopy Date of procedure: TBD  CHADS2-VASc score of 4 (HTN, AGE 78 yrs, CAD)  CrCl 93 ml/min (most recent BMET from 08/2018)  Per office protocol, patient can hold Eliquis for 1-2 days prior to procedure.

## 2019-09-04 ENCOUNTER — Other Ambulatory Visit: Payer: Self-pay | Admitting: Physician Assistant

## 2019-10-01 ENCOUNTER — Encounter: Payer: Self-pay | Admitting: Internal Medicine

## 2019-10-01 ENCOUNTER — Other Ambulatory Visit: Payer: Self-pay

## 2019-10-01 ENCOUNTER — Ambulatory Visit (INDEPENDENT_AMBULATORY_CARE_PROVIDER_SITE_OTHER): Payer: Medicare Other | Admitting: Internal Medicine

## 2019-10-01 VITALS — BP 120/68 | HR 57 | Ht 68.0 in | Wt 207.0 lb

## 2019-10-01 DIAGNOSIS — I495 Sick sinus syndrome: Secondary | ICD-10-CM | POA: Diagnosis not present

## 2019-10-01 DIAGNOSIS — I2510 Atherosclerotic heart disease of native coronary artery without angina pectoris: Secondary | ICD-10-CM

## 2019-10-01 DIAGNOSIS — I480 Paroxysmal atrial fibrillation: Secondary | ICD-10-CM

## 2019-10-01 DIAGNOSIS — I6523 Occlusion and stenosis of bilateral carotid arteries: Secondary | ICD-10-CM | POA: Diagnosis not present

## 2019-10-01 MED ORDER — DOFETILIDE 500 MCG PO CAPS: 500.0000 ug | ORAL_CAPSULE | Freq: Two times a day (BID) | ORAL | 3 refills | Status: DC

## 2019-10-01 MED ORDER — APIXABAN 5 MG PO TABS: 5.0000 mg | ORAL_TABLET | Freq: Two times a day (BID) | ORAL | 3 refills | Status: DC

## 2019-10-01 NOTE — Patient Instructions (Addendum)
Medication Instructions:  Your physician recommends that you continue on your current medications as directed. Please refer to the Current Medication list given to you today.  DO NOT take ASPIRIN  Labwork: None ordered.  Testing/Procedures: None ordered.  Follow-Up: Your physician wants you to follow-up in: one year with Dr. Lovena Le.   You will receive a reminder letter in the mail two months in advance. If you don't receive a letter, please call our office to schedule the follow-up appointment.  Any Other Special Instructions Will Be Listed Below (If Applicable).  If you need a refill on your cardiac medications before your next appointment, please call your pharmacy.

## 2019-10-01 NOTE — Progress Notes (Signed)
HPI [name] returns today for followup of persistent atrial fib. He has been on dofetilide and has not had any symptomatic palpitations or documented atrial fib. He denies chest pain or sob. No syncope. He has not had any bleeding. He notes some weight loss. His appetite is not quite as good.  Allergies  Allergen Reactions  . Perflutren Lipid Microsphere Other (See Comments)    Severe lower back, tailbone, and butt pain. Minor chest pain     Current Outpatient Medications  Medication Sig Dispense Refill  . apixaban (ELIQUIS) 5 MG TABS tablet Take 1 tablet (5 mg total) by mouth 2 (two) times daily. 60 tablet 2  . aspirin EC 81 MG tablet Take 81 mg by mouth daily.    . beclomethasone (QVAR) 80 MCG/ACT inhaler Inhale 1 puff into the lungs 2 (two) times daily as needed (for shortness of breath).     . dofetilide (TIKOSYN) 500 MCG capsule TAKE 1 CAPSULE BY MOUTH TWO TIMES DAILY 180 capsule 3  . finasteride (PROSCAR) 5 MG tablet Take 5 mg by mouth daily.    Marland Kitchen HYDROcodone-acetaminophen (NORCO) 7.5-325 MG tablet Take 1 tablet by mouth every 6 (six) hours as needed (for pain.). 8 tablet 0  . ibuprofen (ADVIL,MOTRIN) 200 MG tablet Take 200 mg by mouth daily as needed for moderate pain.    Marland Kitchen losartan (COZAAR) 50 MG tablet Take 50 mg by mouth daily.    . meclizine (ANTIVERT) 25 MG tablet Take 25 mg by mouth 3 (three) times daily as needed for dizziness.    . nitroGLYCERIN (NITROSTAT) 0.4 MG SL tablet Place 0.4 mg under the tongue every 5 (five) minutes as needed for chest pain.    . Omega-3 Fatty Acids (FISH OIL ULTRA) 1400 MG CAPS Take 1,400 mg by mouth daily.    Marland Kitchen omeprazole (PRILOSEC) 20 MG capsule Take 20 mg by mouth daily before breakfast.     . potassium chloride 20 MEQ TBCR Take 20 mEq by mouth daily. 90 tablet 3  . pravastatin (PRAVACHOL) 10 MG tablet Take 10 mg by mouth at bedtime.    . Vitamin D, Ergocalciferol, (DRISDOL) 50000 units CAPS capsule Take 50,000 Units by mouth every  Sunday.      No current facility-administered medications for this visit.      Past Medical History:  Diagnosis Date  . Abdominal obesity   . Alcohol abuse   . Arthritis   . Atrial fibrillation ([place])   . Carotid artery occlusion   . Carotid bruit   . Chest pain   . Chondrodermatitis nodularis helicis, left   . Coronary artery disease   . GERD (gastroesophageal reflux disease)   . HOH (hard of hearing)   . Hyperlipidemia   . Hypertension   . Myocardial infarction ([place]) 1991  . Thyroid disease   . Vitamin D deficiency     ROS:   All systems reviewed and negative except as noted in the HPI.   Past Surgical History:  Procedure Laterality Date  . CARDIOVERSION N/A 07/25/2018   Procedure: CARDIOVERSION;  Surgeon: Sanda Klein, MD;  Location: [facility];  Service: Cardiovascular;  Laterality: N/A;  . CAROTID ENDARTERECTOMY Right 03/29/2018  . CAROTID STENT  2004  . CORONARY ARTERY BYPASS GRAFT     1 vessel  . ENDARTERECTOMY Right 03/29/2018   Procedure: Right Carotid Endartarectomy;  Surgeon: Rosetta Posner, MD;  Location: [facility];  Service: Vascular;  Laterality: Right;  . LEFT HEART  CATH AND CORS/GRAFTS ANGIOGRAPHY N/A 05/10/2017   Procedure: Left Heart Cath and Cors/Grafts Angiography;  Surgeon: Runell Gess, MD;  Location: [facility] INVASIVE CV LAB;  Service: Cardiovascular;  Laterality: N/A;  . PATCH ANGIOPLASTY Right 03/29/2018   Procedure: PATCH ANGIOPLASTY;  Surgeon: Larina Earthly, MD;  Location: [facility] OR;  Service: Vascular;  Laterality: Right;     Family History  Problem Relation Age of Onset  . Clotting disorder Father   . Clotting disorder Sister      Social History   Socioeconomic History  . Marital status: Married    Spouse name: Not on file  . Number of children: Not on file  . Years of education: Not on file  . Highest education level: Not on file  Occupational History  . Not on file  Social Needs  . Financial resource strain: Not on file  . Food  insecurity    Worry: Not on file    Inability: Not on file  . Transportation needs    Medical: Not on file    Non-medical: Not on file  Tobacco Use  . Smoking status: Former Smoker    Types: Cigarettes    Quit date: 1991    Years since quitting: 29.9  . Smokeless tobacco: Never Used  Substance and Sexual Activity  . Alcohol use: Not Currently  . Drug use: No  . Sexual activity: Not Currently  Lifestyle  . Physical activity    Days per week: Not on file    Minutes per session: Not on file  . Stress: Not on file  Relationships  . Social Musician on phone: Not on file    Gets together: Not on file    Attends religious service: Not on file    Active member of club or organization: Not on file    Attends meetings of clubs or organizations: Not on file    Relationship status: Not on file  . Intimate partner violence    Fear of current or ex partner: Not on file    Emotionally abused: Not on file    Physically abused: Not on file    Forced sexual activity: Not on file  Other Topics Concern  . Not on file  Social History Narrative  . Not on file     BP 120/68   Pulse (!) 57   Ht 5\' 8"  (1.727 m)   Wt 207 lb (93.9 kg)   SpO2 93%   BMI 31.47 kg/m   Physical Exam:  Well appearing NAD HEENT: Unremarkable Neck:  No JVD, no thyromegally Lymphatics:  No adenopathy Back:  No CVA tenderness Lungs:  Clear with no wheezes HEART:  Regular rate rhythm, no murmurs, no rubs, no clicks Abd:  soft, positive bowel sounds, no organomegally, no rebound, no guarding Ext:  2 plus pulses, no edema, no cyanosis, no clubbing Skin:  No rashes no nodules Neuro:  CN II through XII intact, motor grossly intact  EKG - nsr  Assess/Plan: 1. Persistent atrial fib - he is maintaining NSR on dofetilide. He will continue. I will see him back in a year.  2. coags - he has had no bleeding on eliquis. He will continue eliquis and stop ASA. 3. Weight loss - he is still overweight. I  encouraged him to continue. He is down about 10 lbs from a year ago.  .D.

## 2020-07-27 ENCOUNTER — Other Ambulatory Visit: Payer: Self-pay | Admitting: Internal Medicine

## 2020-10-07 ENCOUNTER — Other Ambulatory Visit: Payer: Self-pay | Admitting: Internal Medicine

## 2020-10-11 NOTE — Telephone Encounter (Signed)
Pt last saw Dr Ladona Ridgel 10/01/19, pt is overdue for follow-up. Pt has appt scheduled on 10/18/20 to see Dr Ladona Ridgel.  Last labs 08/22/18 Creat 0.90, pt is overdue for labwork.  Will place note on appt to check CBC and BMP at [facility] on 10/18/20.  Age 79, weight 93.9kg, based on specified criteria pt is on appropriate dosage of Eliquis 5mg  BID.  Will refill rx to get pt to upcoming appt for labwork.

## 2020-10-18 ENCOUNTER — Encounter: Payer: Self-pay | Admitting: Internal Medicine

## 2020-10-18 ENCOUNTER — Ambulatory Visit (INDEPENDENT_AMBULATORY_CARE_PROVIDER_SITE_OTHER): Payer: Medicare Other | Admitting: Internal Medicine

## 2020-10-18 ENCOUNTER — Other Ambulatory Visit: Payer: Self-pay

## 2020-10-18 VITALS — BP 116/68 | HR 64 | Ht 68.0 in | Wt 201.8 lb

## 2020-10-18 DIAGNOSIS — I4819 Other persistent atrial fibrillation: Secondary | ICD-10-CM | POA: Diagnosis not present

## 2020-10-18 MED ORDER — DOFETILIDE 500 MCG PO CAPS: 500.0000 ug | ORAL_CAPSULE | Freq: Two times a day (BID) | ORAL | 3 refills | Status: DC

## 2020-10-18 MED ORDER — APIXABAN 5 MG PO TABS: 5.0000 mg | ORAL_TABLET | Freq: Two times a day (BID) | ORAL | 3 refills | Status: DC

## 2020-10-18 NOTE — Patient Instructions (Addendum)
Medication Instructions:  Your physician recommends that you continue on your current medications as directed. Please refer to the Current Medication list given to you today.  Labwork: None ordered. Patient had blood work at PCP office.  Will get records.  Testing/Procedures: None ordered.  Follow-Up: Your physician wants you to follow-up in: one year with Dr. Ladona Ridgel.   You will receive a reminder letter in the mail two months in advance. If you don't receive a letter, please call our office to schedule the follow-up appointment.   Any Other Special Instructions Will Be Listed Below (If Applicable).  If you need a refill on your cardiac medications before your next appointment, please call your pharmacy.

## 2020-10-18 NOTE — Progress Notes (Signed)
HPI Mr. Guyton returns today for followup of persistent atrial fib. He has been on dofetilide and has not had any symptomatic palpitations or documented atrial fib. He denies chest pain or sob. No syncope. He has not had any bleeding. He notes some weight loss. His appetite is stable. No palpitations. Allergies  Allergen Reactions  . Perflutren Lipid Microsphere Other (See Comments)    Severe lower back, tailbone, and butt pain. Minor chest pain     Current Outpatient Medications  Medication Sig Dispense Refill  . apixaban (ELIQUIS) 5 MG TABS tablet Take 1 tablet (5 mg total) by mouth 2 (two) times daily. 180 tablet 3  . beclomethasone (QVAR) 80 MCG/ACT inhaler Inhale 1 puff into the lungs 2 (two) times daily as needed (for shortness of breath).     . dofetilide (TIKOSYN) 500 MCG capsule Take 1 capsule (500 mcg total) by mouth 2 (two) times daily. 180 capsule 3  . finasteride (PROSCAR) 5 MG tablet Take 5 mg by mouth daily.    Marland Kitchen HYDROcodone-acetaminophen (NORCO) 7.5-325 MG tablet Take 1 tablet by mouth every 6 (six) hours as needed (for pain.). 8 tablet 0  . ibuprofen (ADVIL,MOTRIN) 200 MG tablet Take 200 mg by mouth daily as needed for moderate pain.    Marland Kitchen losartan (COZAAR) 50 MG tablet Take 50 mg by mouth daily.    . meclizine (ANTIVERT) 25 MG tablet Take 25 mg by mouth 3 (three) times daily as needed for dizziness.    . nitroGLYCERIN (NITROSTAT) 0.4 MG SL tablet Place 0.4 mg under the tongue every 5 (five) minutes as needed for chest pain.    . Omega-3 Fatty Acids (FISH OIL ULTRA) 1400 MG CAPS Take 1,400 mg by mouth daily.    Marland Kitchen omeprazole (PRILOSEC) 20 MG capsule Take 20 mg by mouth daily before breakfast.     . potassium chloride 20 MEQ TBCR Take 20 mEq by mouth daily. 90 tablet 3  . pravastatin (PRAVACHOL) 10 MG tablet Take 10 mg by mouth at bedtime.    . Vitamin D, Ergocalciferol, (DRISDOL) 50000 units CAPS capsule Take 50,000 Units by mouth every Sunday.      No current  facility-administered medications for this visit.     Past Medical History:  Diagnosis Date  . Abdominal obesity   . Alcohol abuse   . Arthritis   . Atrial fibrillation (HCC)   . Carotid artery occlusion   . Carotid bruit   . Chest pain   . Chondrodermatitis nodularis helicis, left   . Coronary artery disease   . GERD (gastroesophageal reflux disease)   . HOH (hard of hearing)   . Hyperlipidemia   . Hypertension   . Myocardial infarction (HCC) 1991  . Thyroid disease   . Vitamin D deficiency     ROS:   All systems reviewed and negative except as noted in the HPI.   Past Surgical History:  Procedure Laterality Date  . CARDIOVERSION N/A 07/25/2018   Procedure: CARDIOVERSION;  Surgeon: Thurmon Fair, MD;  Location: [facility];  Service: Cardiovascular;  Laterality: N/A;  . CAROTID ENDARTERECTOMY Right 03/29/2018  . CAROTID STENT  2004  . CORONARY ARTERY BYPASS GRAFT     1 vessel  . ENDARTERECTOMY Right 03/29/2018   Procedure: Right Carotid Endartarectomy;  Surgeon: Larina Earthly, MD;  Location: [facility] OR;  Service: Vascular;  Laterality: Right;  . LEFT HEART CATH AND CORS/GRAFTS ANGIOGRAPHY N/A 05/10/2017   Procedure: Left Heart Cath and Cors/Grafts  Angiography;  Surgeon: Runell Gess, MD;  Location: [facility] INVASIVE CV LAB;  Service: Cardiovascular;  Laterality: N/A;  . PATCH ANGIOPLASTY Right 03/29/2018   Procedure: PATCH ANGIOPLASTY;  Surgeon: Larina Earthly, MD;  Location: [facility] OR;  Service: Vascular;  Laterality: Right;     Family History  Problem Relation Age of Onset  . Clotting disorder Father   . Clotting disorder Sister      Social History   Socioeconomic History  . Marital status: Married    Spouse name: Not on file  . Number of children: Not on file  . Years of education: Not on file  . Highest education level: Not on file  Occupational History  . Not on file  Tobacco Use  . Smoking status: Former Smoker    Types: Cigarettes    Quit date: 1991     Years since quitting: 30.9  . Smokeless tobacco: Never Used  Vaping Use  . Vaping Use: Never used  Substance and Sexual Activity  . Alcohol use: Not Currently  . Drug use: No  . Sexual activity: Not Currently  Other Topics Concern  . Not on file  Social History Narrative  . Not on file   Social Determinants of Health   Financial Resource Strain:   . Difficulty of Paying Living Expenses: Not on file  Food Insecurity:   . Worried About Programme researcher, broadcasting/film/video in the Last Year: Not on file  . Ran Out of Food in the Last Year: Not on file  Transportation Needs:   . Lack of Transportation (Medical): Not on file  . Lack of Transportation (Non-Medical): Not on file  Physical Activity:   . Days of Exercise per Week: Not on file  . Minutes of Exercise per Session: Not on file  Stress:   . Feeling of Stress : Not on file  Social Connections:   . Frequency of Communication with Friends and Family: Not on file  . Frequency of Social Gatherings with Friends and Family: Not on file  . Attends Religious Services: Not on file  . Active Member of Clubs or Organizations: Not on file  . Attends Banker Meetings: Not on file  . Marital Status: Not on file  Intimate Partner Violence:   . Fear of Current or Ex-Partner: Not on file  . Emotionally Abused: Not on file  . Physically Abused: Not on file  . Sexually Abused: Not on file     BP 116/68   Pulse 64   Ht 5\' 8"  (1.727 m)   Wt 201 lb 12.8 oz (91.5 kg)   SpO2 94%   BMI 30.68 kg/m   Physical Exam:  Well appearing NAD HEENT: Unremarkable Neck:  No JVD, no thyromegally Lymphatics:  No adenopathy Back:  No CVA tenderness Lungs:  Clear with no wheezes HEART:  Regular rate rhythm, no murmurs, no rubs, no clicks Abd:  soft, positive bowel sounds, no organomegally, no rebound, no guarding Ext:  2 plus pulses, no edema, no cyanosis, no clubbing Skin:  No rashes no nodules Neuro:  CN II through XII intact, motor grossly  intact  EKG - nsr  Assess/Plan: 1. PAF - he is maintaining NSR. He will continue dofetilide. 2. Obesity - I encouraged the patient to lose weight. We discussed fasting and low carb diets. 3. HTN - his bp is well controlled. No change in meds. 4. Dyslipidemia - he will continue his pravastatin.  Jalil Lorusso,MD

## 2021-04-07 ENCOUNTER — Other Ambulatory Visit: Payer: Self-pay

## 2021-04-07 DIAGNOSIS — I6523 Occlusion and stenosis of bilateral carotid arteries: Secondary | ICD-10-CM

## 2021-04-14 ENCOUNTER — Other Ambulatory Visit: Payer: Self-pay

## 2021-04-14 ENCOUNTER — Ambulatory Visit (HOSPITAL_COMMUNITY)
Admission: RE | Admit: 2021-04-14 | Discharge: 2021-04-14 | Disposition: A | Discharge status: Home / Self Care | Payer: Medicare Other | Source: Ambulatory Visit | Attending: Vascular Surgery | Admitting: Vascular Surgery

## 2021-04-14 ENCOUNTER — Ambulatory Visit (INDEPENDENT_AMBULATORY_CARE_PROVIDER_SITE_OTHER): Payer: Medicare Other | Admitting: Physician Assistant

## 2021-04-14 ENCOUNTER — Ambulatory Visit: Payer: Medicare Other

## 2021-04-14 ENCOUNTER — Ambulatory Visit (HOSPITAL_COMMUNITY): Payer: Medicare Other

## 2021-04-14 VITALS — BP 136/66 | HR 59 | Temp 97.6°F | Resp 20 | Ht 68.0 in | Wt 202.1 lb

## 2021-04-14 DIAGNOSIS — I6523 Occlusion and stenosis of bilateral carotid arteries: Secondary | ICD-10-CM

## 2021-04-14 DIAGNOSIS — G453 Amaurosis fugax: Secondary | ICD-10-CM

## 2021-04-14 NOTE — Progress Notes (Addendum)
Carotid Artery Follow-Up   VASCULAR SURGERY ASSESSMENT & PLAN:   Kevin Gonzales is a 80 y.o. male who is s/p right CEA in May of 2019 by Dr. Arbie Cookey. This was due to asymptomatic severe stenosis.  He has had recent symptoms consistent with amaurosis fugax of the left eye as well as right hand numbness. Duplex examination today reveals worsening stenosis of the left ICA  estimated at 80-99%.  Right ICA stenosis estimated at 1-39%. We will make arrangements for urgent CTA of the neck and follow-up with Dr. Arbie Cookey or next available surgeon. Begin aspirin 81 mg daily. We reviewed the signs and symptoms of stroke/TIA and advised the patient to call EMS should these recur.   Continue optimal medical management of hypertension and follow-up with primary care physician. Encouraged continuance of complete smoking cessation. Continue the following medications: statin. He is maintained on apixaban for atrial fibrillation.  SUBJECTIVE:   The patient states he was recently seen by his ophthalmologist for what he believed was worsening vision due to cataracts.  There was concern for amaurosis fugax and he states he had duplex of the carotid artery and we were contacted for follow-up.  He states he had temporary left eye blindness in the upper half of his left field vision.  He also states he had an episode of right hand numbness recently when working with his computer mouse.  He denies slurred speech, facial drooping.  PHYSICAL EXAM:   Vitals:   04/14/21 1111  Weight: 202 lb 1.6 oz (91.7 kg)  Height: 5\' 8"  (1.727 m)    General appearance: Well-developed, well-nourished in no apparent distress Neurologic: Alert and oriented x4, tongue is midline, face symmetric, speech fluent, 5 out of 5 bilateral upper extremity grip strength, triceps and biceps strength Cardiovascular: Heart rate and rhythm are regular.  Pedal pulses are palpable.  Left carotid bruit. Respirations: Nonlabored   NON-INVASIVE VASCULAR  STUDIES   04/14/2021 Right Carotid: Patent carotid endarterectomy site with velocity in the  right ICA consistent with a 1-39% stenosis.   Left Carotid: Velocities in the left ICA are consistent with a 80-99%  stenosis. The ECA appears >50% stenosed.   Vertebrals: Bilateral vertebral arteries demonstrate antegrade flow.  Subclavians: Right subclavian artery flow was disturbed. Normal flow hemodynamics were seen in the left subclavian artery.    NOTE: Technically difficult exam due to excessive calcific plaque.   PROBLEM LIST:    The patient's past medical history, past surgical history, family history, social history, allergy list and medication list are reviewed.   CURRENT MEDS:    Current Outpatient Medications:  .  apixaban (ELIQUIS) 5 MG TABS tablet, Take 1 tablet (5 mg total) by mouth 2 (two) times daily., Disp: 180 tablet, Rfl: 3 .  beclomethasone (QVAR) 80 MCG/ACT inhaler, Inhale 1 puff into the lungs 2 (two) times daily as needed (for shortness of breath). , Disp: , Rfl:  .  dofetilide (TIKOSYN) 500 MCG capsule, Take 1 capsule (500 mcg total) by mouth 2 (two) times daily., Disp: 180 capsule, Rfl: 3 .  finasteride (PROSCAR) 5 MG tablet, Take 5 mg by mouth daily., Disp: , Rfl:  .  HYDROcodone-acetaminophen (NORCO) 7.5-325 MG tablet, Take 1 tablet by mouth every 6 (six) hours as needed (for pain.)., Disp: 8 tablet, Rfl: 0 .  ibuprofen (ADVIL,MOTRIN) 200 MG tablet, Take 200 mg by mouth daily as needed for moderate pain., Disp: , Rfl:  .  losartan (COZAAR) 50 MG tablet, Take 50 mg by  mouth daily., Disp: , Rfl:  .  meclizine (ANTIVERT) 25 MG tablet, Take 25 mg by mouth 3 (three) times daily as needed for dizziness., Disp: , Rfl:  .  nitroGLYCERIN (NITROSTAT) 0.4 MG SL tablet, Place 0.4 mg under the tongue every 5 (five) minutes as needed for chest pain., Disp: , Rfl:  .  Omega-3 Fatty Acids (FISH OIL ULTRA) 1400 MG CAPS, Take 1,400 mg by mouth daily., Disp: , Rfl:  .  omeprazole  (PRILOSEC) 20 MG capsule, Take 20 mg by mouth daily before breakfast. , Disp: , Rfl:  .  potassium chloride 20 MEQ TBCR, Take 20 mEq by mouth daily., Disp: 90 tablet, Rfl: 3 .  pravastatin (PRAVACHOL) 10 MG tablet, Take 10 mg by mouth at bedtime., Disp: , Rfl:  .  Vitamin D, Ergocalciferol, (DRISDOL) 50000 units CAPS capsule, Take 50,000 Units by mouth every Sunday. , Disp: , Rfl:    REVIEW OF SYSTEMS:   [X]  denotes positive finding, [ ]  denotes negative finding Cardiac  Comments:  Chest pain or chest pressure:    Shortness of breath upon exertion:    Short of breath when lying flat:    Irregular heart rhythm:        Vascular    Pain in calf, thigh, or hip brought on by ambulation:    Pain in feet at night that wakes you up from your sleep:     Blood clot in your veins:    Leg swelling:         Pulmonary    Oxygen at home:    Productive cough:     Wheezing:         Neurologic    Sudden weakness in arms or legs:     Sudden numbness in arms or legs:     Sudden onset of difficulty speaking or slurred speech:    Temporary loss of vision in one eye:     Problems with dizziness:         Gastrointestinal    Blood in stool:     Vomited blood:         Genitourinary    Burning when urinating:     Blood in urine:        Psychiatric    Major depression:         Hematologic    Bleeding problems:    Problems with blood clotting too easily:        Skin    Rashes or ulcers:        Constitutional    Fever or chills:     , PA-C  Office: [phone] 04/14/2021

## 2021-04-21 ENCOUNTER — Other Ambulatory Visit: Payer: Self-pay

## 2021-04-21 ENCOUNTER — Ambulatory Visit (HOSPITAL_COMMUNITY)
Admission: RE | Admit: 2021-04-21 | Discharge: 2021-04-21 | Disposition: A | Discharge status: Home / Self Care | Payer: Medicare Other | Source: Ambulatory Visit | Attending: Vascular Surgery | Admitting: Vascular Surgery

## 2021-04-21 ENCOUNTER — Encounter (HOSPITAL_COMMUNITY): Payer: Self-pay

## 2021-04-21 DIAGNOSIS — G453 Amaurosis fugax: Secondary | ICD-10-CM | POA: Diagnosis present

## 2021-04-21 DIAGNOSIS — I6523 Occlusion and stenosis of bilateral carotid arteries: Secondary | ICD-10-CM | POA: Insufficient documentation

## 2021-04-21 LAB — POCT I-STAT CREATININE: Creatinine, Ser: 0.9 mg/dL (ref 0.61–1.24)

## 2021-04-21 MED ORDER — IOHEXOL 350 MG/ML SOLN
100.0000 mL | Freq: Once | INTRAVENOUS | Status: AC | PRN
  Administered 2021-04-21: 75 mL via INTRAVENOUS

## 2021-04-21 MED ORDER — SODIUM CHLORIDE (PF) 0.9 % IJ SOLN
INTRAMUSCULAR | Status: AC
  Filled 2021-04-21: qty 50

## 2021-04-28 ENCOUNTER — Other Ambulatory Visit: Payer: Self-pay

## 2021-04-28 ENCOUNTER — Ambulatory Visit (INDEPENDENT_AMBULATORY_CARE_PROVIDER_SITE_OTHER): Payer: Medicare Other | Admitting: Vascular Surgery

## 2021-04-28 ENCOUNTER — Encounter: Payer: Self-pay | Admitting: Vascular Surgery

## 2021-04-28 VITALS — BP 146/77 | HR 50 | Temp 98.0°F | Resp 20 | Ht 68.0 in | Wt 200.0 lb

## 2021-04-28 DIAGNOSIS — I6522 Occlusion and stenosis of left carotid artery: Secondary | ICD-10-CM

## 2021-04-28 DIAGNOSIS — I6523 Occlusion and stenosis of bilateral carotid arteries: Secondary | ICD-10-CM

## 2021-04-28 NOTE — Progress Notes (Signed)
Patient is a 80 year old male who was last seen June 2022 in our APP clinic.  He previously underwent right carotid endarterectomy by Dr. Arbie Cookey in 2019.  This was for an asymptomatic stenosis.  Recently he has been having some symptoms in his left eye as well as some right hand numbness.  He was placed on 81 mg of aspirin on June 2.  He returns today after recent CT angiogram.  He has not had any further symptoms in his right hand.  However he has had several episodes of a gray discoloration film over his left eye that comes down over the upper half of the eye and then resolves.  This is happening about once per week.  He is also on Eliquis for atrial fibrillation.  Past Medical History:  Diagnosis Date   Abdominal obesity    Alcohol abuse    Arthritis    Atrial fibrillation (HCC)    Carotid artery occlusion    Carotid bruit    Chest pain    Chondrodermatitis nodularis helicis, left    Coronary artery disease    GERD (gastroesophageal reflux disease)    HOH (hard of hearing)    Hyperlipidemia    Hypertension    Myocardial infarction (HCC) 1991   Thyroid disease    Vitamin D deficiency     Past Surgical History:  Procedure Laterality Date   CARDIOVERSION N/A 07/25/2018   Procedure: CARDIOVERSION;  Surgeon: Thurmon Fair, MD;  Location: [facility];  Service: Cardiovascular;  Laterality: N/A;   CAROTID ENDARTERECTOMY Right 03/29/2018   CAROTID STENT  2004   CORONARY ARTERY BYPASS GRAFT     1 vessel   ENDARTERECTOMY Right 03/29/2018   Procedure: Right Carotid Endartarectomy;  Surgeon: Larina Earthly, MD;  Location: [facility] OR;  Service: Vascular;  Laterality: Right;   LEFT HEART CATH AND CORS/GRAFTS ANGIOGRAPHY N/A 05/10/2017   Procedure: Left Heart Cath and Cors/Grafts Angiography;  Surgeon: Runell Gess, MD;  Location: [facility] INVASIVE CV LAB;  Service: Cardiovascular;  Laterality: N/A;   PATCH ANGIOPLASTY Right 03/29/2018   Procedure: PATCH ANGIOPLASTY;  Surgeon: Larina Earthly, MD;   Location: [facility] OR;  Service: Vascular;  Laterality: Right;    Current Outpatient Medications on File Prior to Visit  Medication Sig Dispense Refill   apixaban (ELIQUIS) 5 MG TABS tablet Take 1 tablet (5 mg total) by mouth 2 (two) times daily. 180 tablet 3   aspirin EC 81 MG tablet Take 81 mg by mouth daily. Swallow whole.     beclomethasone (QVAR) 80 MCG/ACT inhaler Inhale 1 puff into the lungs 2 (two) times daily as needed (for shortness of breath).      dofetilide (TIKOSYN) 500 MCG capsule Take 1 capsule (500 mcg total) by mouth 2 (two) times daily. 180 capsule 3   finasteride (PROSCAR) 5 MG tablet Take 5 mg by mouth daily.     ibuprofen (ADVIL,MOTRIN) 200 MG tablet Take 200 mg by mouth daily as needed for moderate pain.     losartan (COZAAR) 100 MG tablet Take 1 tablet by mouth daily.     losartan (COZAAR) 50 MG tablet Take 50 mg by mouth daily.     meclizine (ANTIVERT) 25 MG tablet Take 25 mg by mouth 3 (three) times daily as needed for dizziness.     nitroGLYCERIN (NITROSTAT) 0.4 MG SL tablet Place 0.4 mg under the tongue every 5 (five) minutes as needed for chest pain.     Omega-3 Fatty Acids (FISH OIL ULTRA)  1400 MG CAPS Take 1,400 mg by mouth daily.     omeprazole (PRILOSEC) 20 MG capsule Take 20 mg by mouth daily before breakfast.      potassium chloride SA (KLOR-CON) 20 MEQ tablet Take 20 mEq by mouth daily.     pravastatin (PRAVACHOL) 10 MG tablet Take 10 mg by mouth at bedtime.     Vitamin D, Ergocalciferol, (DRISDOL) 50000 units CAPS capsule Take 50,000 Units by mouth every Sunday.      HYDROcodone-acetaminophen (NORCO) 7.5-325 MG tablet Take 1 tablet by mouth every 6 (six) hours as needed (for pain.). (Patient not taking: Reported on 04/28/2021) 8 tablet 0   No current facility-administered medications on file prior to visit.    Social History   Socioeconomic History   Marital status: Married    Spouse name: Not on file   Number of children: Not on file   Years of education:  Not on file   Highest education level: Not on file  Occupational History   Not on file  Tobacco Use   Smoking status: Former    Pack years: 0.00    Types: Cigarettes    Quit date: 81    Years since quitting: 31.4   Smokeless tobacco: Never  Vaping Use   Vaping Use: Never used  Substance and Sexual Activity   Alcohol use: Not Currently   Drug use: No   Sexual activity: Not Currently  Other Topics Concern   Not on file  Social History Narrative   Not on file   Social Determinants of Health   Financial Resource Strain: Not on file  Food Insecurity: Not on file  Transportation Needs: Not on file  Physical Activity: Not on file  Stress: Not on file  Social Connections: Not on file  Intimate Partner Violence: Not on file    Physical exam:  Vitals:   04/28/21 0947 04/28/21 0950  BP: (!) 155/81 (!) 146/77  Pulse: (!) 50   Resp: 20   Temp: 98 F (36.7 C)   SpO2: 95%   Weight: 200 lb (90.7 kg)   Height: 5\' 8"  (1.727 m)    Neck: Patient has full range of motion and is able to do full extension.  Well-healed right neck carotid scar.  2+ carotid pulses  Neuro: Symmetric upper extremity lower extremity motor strength no facial asymmetry  Data: I reviewed the images and interpreted those from his CT angiogram dated April 21, 2021 today.  The right carotid system is widely patent.  The left proximal internal carotid artery and carotid bulb has a very irregular calcification with about a 70% stenosis.  He also has bilateral vertebral stenosis.  He has a very short left common carotid with only about 4 cm of distance below the carotid bifurcation.  The depth of the left common carotid artery just above the clavicle is 3.4 cm.  There is moderate calcification.  I reviewed Dr. April 23, 2021 previous operative note which did show that the right side had a fairly high carotid bifurcation which required division of the digastric muscle.  My review of the images today show that the left side is  a relatively high carotid bifurcation as well with the distal endpoint being at the bottom of C2.  His previous carotid duplex scan was reviewed as well.  This was on April 14, 2021.  This showed no significant right-sided stenosis.  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 Arbie Cookey next Tuesday, May 03, 2021.  We will stop his Eliquis 3 days prior to this.  He will continue his aspirin.  On review of his films I do not believe he is a very good candidate for TCAR stenting due to a short common carotid runway and depth of the common carotid on the left side.  I discussed with the patient today left carotid endarterectomy as well as the fact that the lesion is up relatively high and may require some mobilization of the vagus and hypoglossal nerves that may cause some some very swallowing difficulties.  Hopefully this will be avoided as he certainly did not have any symptoms on his last carotid enterectomy.  Risk of stroke was also discussed.  Fabienne Bruns, MD [facility] Office: [phone]

## 2021-04-28 NOTE — H&P (View-Only) (Signed)
Patient is a 80 year old male who was last seen June 2022 in our APP clinic.  He previously underwent right carotid endarterectomy by Dr. Arbie Cookey in 2019.  This was for an asymptomatic stenosis.  Recently he has been having some symptoms in his left eye as well as some right hand numbness.  He was placed on 81 mg of aspirin on June 2.  He returns today after recent CT angiogram.  He has not had any further symptoms in his right hand.  However he has had several episodes of a gray discoloration film over his left eye that comes down over the upper half of the eye and then resolves.  This is happening about once per week.  He is also on Eliquis for atrial fibrillation.  Past Medical History:  Diagnosis Date   Abdominal obesity    Alcohol abuse    Arthritis    Atrial fibrillation (HCC)    Carotid artery occlusion    Carotid bruit    Chest pain    Chondrodermatitis nodularis helicis, left    Coronary artery disease    GERD (gastroesophageal reflux disease)    HOH (hard of hearing)    Hyperlipidemia    Hypertension    Myocardial infarction (HCC) 1991   Thyroid disease    Vitamin D deficiency     Past Surgical History:  Procedure Laterality Date   CARDIOVERSION N/A 07/25/2018   Procedure: CARDIOVERSION;  Surgeon: Thurmon Fair, MD;  Location: [facility] ENDOSCOPY;  Service: Cardiovascular;  Laterality: N/A;   CAROTID ENDARTERECTOMY Right 03/29/2018   CAROTID STENT  2004   CORONARY ARTERY BYPASS GRAFT     1 vessel   ENDARTERECTOMY Right 03/29/2018   Procedure: Right Carotid Endartarectomy;  Surgeon: Larina Earthly, MD;  Location: [facility] OR;  Service: Vascular;  Laterality: Right;   LEFT HEART CATH AND CORS/GRAFTS ANGIOGRAPHY N/A 05/10/2017   Procedure: Left Heart Cath and Cors/Grafts Angiography;  Surgeon: Runell Gess, MD;  Location: [facility] INVASIVE CV LAB;  Service: Cardiovascular;  Laterality: N/A;   PATCH ANGIOPLASTY Right 03/29/2018   Procedure: PATCH ANGIOPLASTY;  Surgeon: Larina Earthly, MD;   Location: [facility] OR;  Service: Vascular;  Laterality: Right;    Current Outpatient Medications on File Prior to Visit  Medication Sig Dispense Refill   apixaban (ELIQUIS) 5 MG TABS tablet Take 1 tablet (5 mg total) by mouth 2 (two) times daily. 180 tablet 3   aspirin EC 81 MG tablet Take 81 mg by mouth daily. Swallow whole.     beclomethasone (QVAR) 80 MCG/ACT inhaler Inhale 1 puff into the lungs 2 (two) times daily as needed (for shortness of breath).      dofetilide (TIKOSYN) 500 MCG capsule Take 1 capsule (500 mcg total) by mouth 2 (two) times daily. 180 capsule 3   finasteride (PROSCAR) 5 MG tablet Take 5 mg by mouth daily.     ibuprofen (ADVIL,MOTRIN) 200 MG tablet Take 200 mg by mouth daily as needed for moderate pain.     losartan (COZAAR) 100 MG tablet Take 1 tablet by mouth daily.     losartan (COZAAR) 50 MG tablet Take 50 mg by mouth daily.     meclizine (ANTIVERT) 25 MG tablet Take 25 mg by mouth 3 (three) times daily as needed for dizziness.     nitroGLYCERIN (NITROSTAT) 0.4 MG SL tablet Place 0.4 mg under the tongue every 5 (five) minutes as needed for chest pain.     Omega-3 Fatty Acids (FISH OIL ULTRA)  1400 MG CAPS Take 1,400 mg by mouth daily.     omeprazole (PRILOSEC) 20 MG capsule Take 20 mg by mouth daily before breakfast.      potassium chloride SA (KLOR-CON) 20 MEQ tablet Take 20 mEq by mouth daily.     pravastatin (PRAVACHOL) 10 MG tablet Take 10 mg by mouth at bedtime.     Vitamin D, Ergocalciferol, (DRISDOL) 50000 units CAPS capsule Take 50,000 Units by mouth every Sunday.      HYDROcodone-acetaminophen (NORCO) 7.5-325 MG tablet Take 1 tablet by mouth every 6 (six) hours as needed (for pain.). (Patient not taking: Reported on 04/28/2021) 8 tablet 0   No current facility-administered medications on file prior to visit.    Social History   Socioeconomic History   Marital status: Married    Spouse name: Not on file   Number of children: Not on file   Years of education:  Not on file   Highest education level: Not on file  Occupational History   Not on file  Tobacco Use   Smoking status: Former    Pack years: 0.00    Types: Cigarettes    Quit date: 81    Years since quitting: 31.4   Smokeless tobacco: Never  Vaping Use   Vaping Use: Never used  Substance and Sexual Activity   Alcohol use: Not Currently   Drug use: No   Sexual activity: Not Currently  Other Topics Concern   Not on file  Social History Narrative   Not on file   Social Determinants of Health   Financial Resource Strain: Not on file  Food Insecurity: Not on file  Transportation Needs: Not on file  Physical Activity: Not on file  Stress: Not on file  Social Connections: Not on file  Intimate Partner Violence: Not on file    Physical exam:  Vitals:   04/28/21 0947 04/28/21 0950  BP: (!) 155/81 (!) 146/77  Pulse: (!) 50   Resp: 20   Temp: 98 F (36.7 C)   SpO2: 95%   Weight: 200 lb (90.7 kg)   Height: 5\' 8"  (1.727 m)    Neck: Patient has full range of motion and is able to do full extension.  Well-healed right neck carotid scar.  2+ carotid pulses  Neuro: Symmetric upper extremity lower extremity motor strength no facial asymmetry  Data: I reviewed the images and interpreted those from his CT angiogram dated April 21, 2021 today.  The right carotid system is widely patent.  The left proximal internal carotid artery and carotid bulb has a very irregular calcification with about a 70% stenosis.  He also has bilateral vertebral stenosis.  He has a very short left common carotid with only about 4 cm of distance below the carotid bifurcation.  The depth of the left common carotid artery just above the clavicle is 3.4 cm.  There is moderate calcification.  I reviewed Dr. April 23, 2021 previous operative note which did show that the right side had a fairly high carotid bifurcation which required division of the digastric muscle.  My review of the images today show that the left side is  a relatively high carotid bifurcation as well with the distal endpoint being at the bottom of C2.  His previous carotid duplex scan was reviewed as well.  This was on April 14, 2021.  This showed no significant right-sided stenosis.  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 Arbie Cookey next Tuesday, May 03, 2021.  We will stop his Eliquis 3 days prior to this.  He will continue his aspirin.  On review of his films I do not believe he is a very good candidate for TCAR stenting due to a short common carotid runway and depth of the common carotid on the left side.  I discussed with the patient today left carotid endarterectomy as well as the fact that the lesion is up relatively high and may require some mobilization of the vagus and hypoglossal nerves that may cause some some very swallowing difficulties.  Hopefully this will be avoided as he certainly did not have any symptoms on his last carotid enterectomy.  Risk of stroke was also discussed.  Fabienne Bruns, MD [facility] Office: [phone]

## 2021-04-29 ENCOUNTER — Other Ambulatory Visit (HOSPITAL_COMMUNITY)
Admission: RE | Admit: 2021-04-29 | Discharge: 2021-04-29 | Disposition: A | Discharge status: Home / Self Care | Payer: Medicare Other | Source: Ambulatory Visit | Attending: Vascular Surgery | Admitting: Vascular Surgery

## 2021-04-29 DIAGNOSIS — Z01812 Encounter for preprocedural laboratory examination: Secondary | ICD-10-CM | POA: Insufficient documentation

## 2021-04-29 DIAGNOSIS — Z20822 Contact with and (suspected) exposure to covid-19: Secondary | ICD-10-CM | POA: Diagnosis not present

## 2021-04-29 LAB — SARS CORONAVIRUS 2 (TAT 6-24 HRS): SARS Coronavirus 2: NEGATIVE

## 2021-05-01 ENCOUNTER — Encounter (HOSPITAL_COMMUNITY): Payer: Self-pay | Admitting: Vascular Surgery

## 2021-05-02 ENCOUNTER — Other Ambulatory Visit: Payer: Self-pay

## 2021-05-02 ENCOUNTER — Encounter (HOSPITAL_COMMUNITY): Payer: Self-pay | Admitting: Vascular Surgery

## 2021-05-02 NOTE — Progress Notes (Addendum)
Kevin Gonzales tested negative for Covid 04/29/21- I asked patient if he had worn the mask when he was around others- patient said,  "I  have had 2 vaccines and a booster, mostly I've been around my wife," I informed patient that he may have to be retested in am.  Kevin Gonzales denies shortness of breath at rest.Patient states that he sometimes has a little chest pain, it last less that 1 minute. Patient rates the pain  a "1" out of 10. Kevin Gonzales reported "the pain in the center of chest, it is a pressure that gets my attention and then it is gone."  Patient stated that the pain can start at any time. Kevin Gonzales denies shortness of breath or lightheadedness. Patient reported that the pain goes away as fast as it came, "It is not like the ones I had when I had the heart attack, I do not need to take NTG with these chest pains." Patient could not give a frequency, he said that he has gotten to where he ignores the pain.

## 2021-05-02 NOTE — Anesthesia Preprocedure Evaluation (Addendum)
Anesthesia Evaluation  Patient identified by MRN, date of birth, ID band Patient awake    Reviewed: Allergy & Precautions, NPO status , Patient's Chart, lab work & pertinent test results  Airway Mallampati: II  TM Distance: >3 FB Neck ROM: Full    Dental no notable dental hx.    Pulmonary neg pulmonary ROS, former smoker,    Pulmonary exam normal breath sounds clear to auscultation       Cardiovascular hypertension, Pt. on medications + CAD, + Past MI and + Cardiac Stents  Normal cardiovascular exam Rhythm:Regular Rate:Normal     Neuro/Psych negative neurological ROS  negative psych ROS   GI/Hepatic Neg liver ROS, GERD  ,  Endo/Other  negative endocrine ROS  Renal/GU negative Renal ROS  negative genitourinary   Musculoskeletal  (+) Arthritis , Osteoarthritis,    Abdominal (+) + obese,   Peds negative pediatric ROS (+)  Hematology negative hematology ROS (+)   Anesthesia Other Findings   Reproductive/Obstetrics negative OB ROS                            Anesthesia Physical Anesthesia Plan  ASA: 3  Anesthesia Plan: General   Post-op Pain Management:    Induction: Intravenous  PONV Risk Score and Plan: 2 and Ondansetron, Midazolam and Treatment may vary due to age or medical condition  Airway Management Planned: Oral ETT  Additional Equipment: Arterial line  Intra-op Plan:   Post-operative Plan: Extubation in OR  Informed Consent: I have reviewed the patients History and Physical, chart, labs and discussed the procedure including the risks, benefits and alternatives for the proposed anesthesia with the patient or authorized representative who has indicated his/her understanding and acceptance.     Dental advisory given  Plan Discussed with: CRNA  Anesthesia Plan Comments: (PAT note by Antionette Poles, PA-C:  Pertinent hx includes CAD/MI '91 (s/p CABG: RIMA-RCA '91; LAD DES  02/20/04), paroxysmal atrial fibrillation on Eliquis, chest pain 04/2017 (patent LAD stent, RIMA graft, 90% non-dominant CX that would be difficult PCI, medical therapy recommended 05/10/17), carotid artery stenosis (Right CEA 2019), HTN, ETOH abuse (not currently).  Last seen by EP cardiologist Dr. Ladona Ridgel on 10/18/2020.  Per note, stable from cardiac standpoint, maintaining sinus rhythm on dofetilide.  Per surgery posting, patient to stop Eliquis 04/30/2021 and continue ASA.  Will need day of surgery labs and evaluation.  EKG 10/18/2020: NSR.  Rate 64.  Carotid ultrasound 04/14/2021: Summary:  Right Carotid: Patent carotid endarterectomy site with velocity in the right ICA consistent with a 1-39% stenosis.  Left Carotid: Velocities in the left ICA are consistent with a 80-99% stenosis. The ECA appears >50% stenosed. Left bifurcation is located near the mandible.  Vertebrals: Bilateral vertebral arteries demonstrate antegrade flow. Subclavians: Right subclavian artery flow was disturbed. Normal flow hemodynamics were seen in the left subclavian artery.  NOTE: Technically difficult exam due to excessive calcific plaque.   TTE 07/12/18: - Left ventricle: The cavity size was normal. There was mild  concentric hypertrophy. Systolic function was normal. The  estimated ejection fraction was in the range of 60% to 65%. Wall  motion was normal; there were no regional wall motion  abnormalities.  - Aortic valve: Moderately to severely calcified annulus.  Trileaflet; mildly thickened, mildly calcified leaflets.  - Mitral valve: Calcified annulus.  - Left atrium: The atrium was mildly dilated.  - Tricuspid valve: There was mild regurgitation.  - Pulmonary arteries: Systolic pressure  could not be accurately  estimated.   Cardiac cath 05/10/17: . Ost LAD to Mid LAD lesion, 0 %stenosed. Suezanne Jacquet Cx to Prox Cx lesion, 90 %stenosed. Suezanne Jacquet LM to LM lesion, 40 %stenosed. Suezanne Jacquet RCA to Prox RCA  lesion, 100 %stenosed. Marland Kitchen RIMA and is normal in caliber and anatomically normal. . The left ventricular systolic function is normal. . LV end diastolic pressure is normal. . The left ventricular ejection fraction is 55-65% by visual estimate. IMPRESSION:Mr. Koren has a patent proximal LAD stent and a patent RIMA graft to the distal right coronary artery with normal LV function. He does appear to have a high-grade ostial nondominant circumflex which would be difficult to treat percutaneously as well as mild to moderate diffuse left main disease in the 30-40% range. I recommend continued medical therapy. )       Anesthesia Quick Evaluation

## 2021-05-02 NOTE — Progress Notes (Signed)
Anesthesia Chart Review: Same day workup  Pertinent hx includes CAD/MI '91 (s/p CABG: RIMA-RCA '91; LAD DES 02/20/04), paroxysmal atrial fibrillation on Eliquis, chest pain 04/2017 (patent LAD stent, RIMA graft, 90% non-dominant CX that would be difficult PCI, medical therapy recommended 05/10/17), carotid artery stenosis (Right CEA 2019), HTN, ETOH abuse (not currently).  Last seen by EP cardiologist Dr. Ladona Ridgel on 10/18/2020.  Per note, stable from cardiac standpoint, maintaining sinus rhythm on dofetilide.  Per surgery posting, patient to stop Eliquis 04/30/2021 and continue ASA.  Will need day of surgery labs and evaluation.  EKG 10/18/2020: NSR.  Rate 64.  Carotid ultrasound 04/14/2021: Summary:  Right Carotid: Patent carotid endarterectomy site with velocity in the right ICA consistent with a 1-39% stenosis.  Left Carotid: Velocities in the left ICA are consistent with a 80-99% stenosis. The ECA appears >50% stenosed. Left bifurcation is located near the mandible.  Vertebrals:  Bilateral vertebral arteries demonstrate antegrade flow. Subclavians: Right subclavian artery flow was disturbed. Normal flow hemodynamics were seen in the left subclavian artery.   NOTE: Technically difficult exam due to excessive calcific plaque.   TTE 07/12/18: - Left ventricle: The cavity size was normal. There was mild    concentric hypertrophy. Systolic function was normal. The    estimated ejection fraction was in the range of 60% to 65%. Wall    motion was normal; there were no regional wall motion    abnormalities.  - Aortic valve: Moderately to severely calcified annulus.    Trileaflet; mildly thickened, mildly calcified leaflets.  - Mitral valve: Calcified annulus.  - Left atrium: The atrium was mildly dilated.  - Tricuspid valve: There was mild regurgitation.  - Pulmonary arteries: Systolic pressure could not be accurately    estimated.   Cardiac cath 05/10/17: Ost LAD to Mid LAD lesion, 0  %stenosed. Ost Cx to Prox Cx lesion, 90 %stenosed. Ost LM to LM lesion, 40 %stenosed. Ost RCA to Prox RCA lesion, 100 %stenosed. RIMA and is normal in caliber and anatomically normal. The left ventricular systolic function is normal. LV end diastolic pressure is normal. The left ventricular ejection fraction is 55-65% by visual estimate. IMPRESSION: Mr. Ammon has a patent proximal LAD stent and a patent RIMA graft to the distal right coronary artery with normal LV function. He does appear to have a high-grade ostial nondominant circumflex which would be difficult to treat percutaneously as well as mild to moderate diffuse left main disease in the 30-40% range. I recommend continued medical therapy.    Zannie Cove [facility] Short Stay Center/Anesthesiology Phone [phone] 05/02/2021 2:03 PM

## 2021-05-03 ENCOUNTER — Inpatient Hospital Stay (HOSPITAL_COMMUNITY): Payer: Medicare Other | Admitting: Physician Assistant

## 2021-05-03 ENCOUNTER — Other Ambulatory Visit: Payer: Self-pay

## 2021-05-03 ENCOUNTER — Inpatient Hospital Stay (HOSPITAL_COMMUNITY)
Admission: RE | Admit: 2021-05-03 | Discharge: 2021-05-04 | DRG: 039 | Disposition: A | Discharge status: Home / Self Care | Payer: Medicare Other | Attending: Vascular Surgery | Admitting: Vascular Surgery

## 2021-05-03 ENCOUNTER — Encounter (HOSPITAL_COMMUNITY): Payer: Self-pay | Admitting: Vascular Surgery

## 2021-05-03 ENCOUNTER — Encounter (HOSPITAL_COMMUNITY)
Admission: RE | Disposition: A | Discharge status: Home / Self Care | Payer: Self-pay | Source: Home / Self Care | Attending: Vascular Surgery

## 2021-05-03 DIAGNOSIS — E785 Hyperlipidemia, unspecified: Secondary | ICD-10-CM | POA: Diagnosis not present

## 2021-05-03 DIAGNOSIS — I10 Essential (primary) hypertension: Secondary | ICD-10-CM | POA: Diagnosis not present

## 2021-05-03 DIAGNOSIS — Z7951 Long term (current) use of inhaled steroids: Secondary | ICD-10-CM

## 2021-05-03 DIAGNOSIS — Z951 Presence of aortocoronary bypass graft: Secondary | ICD-10-CM

## 2021-05-03 DIAGNOSIS — M1990 Unspecified osteoarthritis, unspecified site: Secondary | ICD-10-CM | POA: Diagnosis present

## 2021-05-03 DIAGNOSIS — I6529 Occlusion and stenosis of unspecified carotid artery: Secondary | ICD-10-CM | POA: Diagnosis present

## 2021-05-03 DIAGNOSIS — I252 Old myocardial infarction: Secondary | ICD-10-CM

## 2021-05-03 DIAGNOSIS — Z79899 Other long term (current) drug therapy: Secondary | ICD-10-CM | POA: Diagnosis not present

## 2021-05-03 DIAGNOSIS — I4891 Unspecified atrial fibrillation: Secondary | ICD-10-CM | POA: Diagnosis present

## 2021-05-03 DIAGNOSIS — R490 Dysphonia: Secondary | ICD-10-CM | POA: Diagnosis not present

## 2021-05-03 DIAGNOSIS — K219 Gastro-esophageal reflux disease without esophagitis: Secondary | ICD-10-CM | POA: Diagnosis present

## 2021-05-03 DIAGNOSIS — Z87891 Personal history of nicotine dependence: Secondary | ICD-10-CM | POA: Diagnosis not present

## 2021-05-03 DIAGNOSIS — Z6830 Body mass index (BMI) 30.0-30.9, adult: Secondary | ICD-10-CM

## 2021-05-03 DIAGNOSIS — H9190 Unspecified hearing loss, unspecified ear: Secondary | ICD-10-CM | POA: Diagnosis present

## 2021-05-03 DIAGNOSIS — Z7982 Long term (current) use of aspirin: Secondary | ICD-10-CM

## 2021-05-03 DIAGNOSIS — I2510 Atherosclerotic heart disease of native coronary artery without angina pectoris: Secondary | ICD-10-CM | POA: Diagnosis present

## 2021-05-03 DIAGNOSIS — I6522 Occlusion and stenosis of left carotid artery: Secondary | ICD-10-CM | POA: Diagnosis not present

## 2021-05-03 DIAGNOSIS — Z7901 Long term (current) use of anticoagulants: Secondary | ICD-10-CM | POA: Diagnosis not present

## 2021-05-03 DIAGNOSIS — E669 Obesity, unspecified: Secondary | ICD-10-CM | POA: Diagnosis present

## 2021-05-03 HISTORY — PX: ENDARTERECTOMY: SHX5162

## 2021-05-03 HISTORY — DX: Dyspnea, unspecified: R06.00

## 2021-05-03 LAB — TYPE AND SCREEN
ABO/RH(D): A POS
Antibody Screen: NEGATIVE

## 2021-05-03 LAB — APTT: aPTT: 27 seconds (ref 24–36)

## 2021-05-03 LAB — CBC
HCT: 40.5 % (ref 39.0–52.0)
Hemoglobin: 13.2 g/dL (ref 13.0–17.0)
MCH: 31.7 pg (ref 26.0–34.0)
MCHC: 32.6 g/dL (ref 30.0–36.0)
MCV: 97.4 fL (ref 80.0–100.0)
Platelets: 207 10*3/uL (ref 150–400)
RBC: 4.16 MIL/uL — ABNORMAL LOW (ref 4.22–5.81)
RDW: 11.9 % (ref 11.5–15.5)
WBC: 5.1 10*3/uL (ref 4.0–10.5)
nRBC: 0 % (ref 0.0–0.2)

## 2021-05-03 LAB — COMPREHENSIVE METABOLIC PANEL
ALT: 14 U/L (ref 0–44)
AST: 28 U/L (ref 15–41)
Albumin: 3.6 g/dL (ref 3.5–5.0)
Alkaline Phosphatase: 32 U/L — ABNORMAL LOW (ref 38–126)
Anion gap: 13 (ref 5–15)
BUN: 21 mg/dL (ref 8–23)
CO2: 23 mmol/L (ref 22–32)
Calcium: 9.2 mg/dL (ref 8.9–10.3)
Chloride: 104 mmol/L (ref 98–111)
Creatinine, Ser: 0.89 mg/dL (ref 0.61–1.24)
GFR, Estimated: >60 mL/min (ref >60)
Glucose, Bld: 123 mg/dL — ABNORMAL HIGH (ref 70–99)
Potassium: 3.8 mmol/L (ref 3.5–5.1)
Sodium: 140 mmol/L (ref 135–145)
Total Bilirubin: 1.3 mg/dL — ABNORMAL HIGH (ref 0.3–1.2)
Total Protein: 6 g/dL — ABNORMAL LOW (ref 6.5–8.1)

## 2021-05-03 LAB — PROTIME-INR
INR: 1.1 (ref 0.8–1.2)
Prothrombin Time: 14 seconds (ref 11.4–15.2)

## 2021-05-03 LAB — SURGICAL PCR SCREEN
MRSA, PCR: NEGATIVE
Staphylococcus aureus: NEGATIVE

## 2021-05-03 LAB — MAGNESIUM: Magnesium: 1.9 mg/dL (ref 1.7–2.4)

## 2021-05-03 SURGERY — ENDARTERECTOMY, CAROTID
Anesthesia: General | Site: Neck | Laterality: Left

## 2021-05-03 MED ORDER — DOFETILIDE 500 MCG PO CAPS
500.0000 ug | ORAL_CAPSULE | Freq: Two times a day (BID) | ORAL | Status: DC
  Administered 2021-05-03 – 2021-05-04 (×2): 500 ug via ORAL
  Filled 2021-05-03 (×4): qty 1

## 2021-05-03 MED ORDER — HYDRALAZINE HCL 20 MG/ML IJ SOLN: 5.0000 mg | INTRAMUSCULAR | Status: DC | PRN

## 2021-05-03 MED ORDER — SODIUM CHLORIDE 0.9 % IV SOLN: 500.0000 mL | Freq: Once | INTRAVENOUS | Status: DC | PRN

## 2021-05-03 MED ORDER — ONDANSETRON HCL 4 MG/2ML IJ SOLN: 4.0000 mg | Freq: Four times a day (QID) | INTRAMUSCULAR | Status: DC | PRN

## 2021-05-03 MED ORDER — PROPOFOL 10 MG/ML IV BOLUS
INTRAVENOUS | Status: AC
  Filled 2021-05-03: qty 20

## 2021-05-03 MED ORDER — ASPIRIN EC 81 MG PO TBEC
81.0000 mg | DELAYED_RELEASE_TABLET | Freq: Every day | ORAL | Status: DC
  Administered 2021-05-04: 81 mg via ORAL
  Filled 2021-05-03: qty 1

## 2021-05-03 MED ORDER — NITROGLYCERIN 0.4 MG SL SUBL: 0.4000 mg | SUBLINGUAL_TABLET | SUBLINGUAL | Status: DC | PRN

## 2021-05-03 MED ORDER — FINASTERIDE 5 MG PO TABS
5.0000 mg | ORAL_TABLET | Freq: Every day | ORAL | Status: DC
  Administered 2021-05-04: 5 mg via ORAL
  Filled 2021-05-03: qty 1

## 2021-05-03 MED ORDER — HYDROMORPHONE HCL 1 MG/ML IJ SOLN: 0.2500 mg | INTRAMUSCULAR | Status: DC | PRN

## 2021-05-03 MED ORDER — LACTATED RINGERS IV SOLN: INTRAVENOUS | Status: DC

## 2021-05-03 MED ORDER — LOSARTAN POTASSIUM 50 MG PO TABS: 100.0000 mg | ORAL_TABLET | Freq: Every day | ORAL | Status: DC

## 2021-05-03 MED ORDER — SUGAMMADEX SODIUM 200 MG/2ML IV SOLN
INTRAVENOUS | Status: DC | PRN
  Administered 2021-05-03: 200 mg via INTRAVENOUS

## 2021-05-03 MED ORDER — FENTANYL CITRATE (PF) 250 MCG/5ML IJ SOLN
INTRAMUSCULAR | Status: DC | PRN
  Administered 2021-05-03: 100 ug via INTRAVENOUS

## 2021-05-03 MED ORDER — SODIUM CHLORIDE 0.9 % IV SOLN
0.0125 ug/kg/min | INTRAVENOUS | Status: AC
  Administered 2021-05-03: .05 ug/kg/min via INTRAVENOUS
  Filled 2021-05-03: qty 2000

## 2021-05-03 MED ORDER — SODIUM CHLORIDE 0.9 % IV SOLN: INTRAVENOUS | Status: DC

## 2021-05-03 MED ORDER — CHLORHEXIDINE GLUCONATE 0.12 % MT SOLN
15.0000 mL | Freq: Once | OROMUCOSAL | Status: AC
Start: 2021-05-03 — End: 2021-05-03
  Administered 2021-05-03: 15 mL via OROMUCOSAL
  Filled 2021-05-03: qty 15

## 2021-05-03 MED ORDER — OXYCODONE HCL 5 MG/5ML PO SOLN: 5.0000 mg | Freq: Once | ORAL | Status: DC | PRN

## 2021-05-03 MED ORDER — ORAL CARE MOUTH RINSE: 15.0000 mL | Freq: Once | OROMUCOSAL | Status: AC

## 2021-05-03 MED ORDER — 0.9 % SODIUM CHLORIDE (POUR BTL) OPTIME
TOPICAL | Status: DC | PRN
  Administered 2021-05-03: 2000 mL

## 2021-05-03 MED ORDER — PROMETHAZINE HCL 25 MG/ML IJ SOLN
6.2500 mg | INTRAMUSCULAR | Status: DC | PRN
Start: 2021-05-03 — End: 2021-05-03
  Administered 2021-05-03: 6.25 mg via INTRAVENOUS

## 2021-05-03 MED ORDER — ACETAMINOPHEN 325 MG PO TABS: 325.0000 mg | ORAL_TABLET | ORAL | Status: DC | PRN

## 2021-05-03 MED ORDER — MAGNESIUM SULFATE 2 GM/50ML IV SOLN: 2.0000 g | Freq: Every day | INTRAVENOUS | Status: DC | PRN

## 2021-05-03 MED ORDER — SODIUM CHLORIDE 0.9 % IV SOLN
INTRAVENOUS | Status: AC
  Filled 2021-05-03: qty 1.2

## 2021-05-03 MED ORDER — EPHEDRINE SULFATE-NACL 50-0.9 MG/10ML-% IV SOSY
PREFILLED_SYRINGE | INTRAVENOUS | Status: DC | PRN
  Administered 2021-05-03: 10 mg via INTRAVENOUS

## 2021-05-03 MED ORDER — PROPOFOL 10 MG/ML IV BOLUS
INTRAVENOUS | Status: DC | PRN
  Administered 2021-05-03: 120 mg via INTRAVENOUS

## 2021-05-03 MED ORDER — GLYCOPYRROLATE PF 0.2 MG/ML IJ SOSY
PREFILLED_SYRINGE | INTRAMUSCULAR | Status: DC | PRN
  Administered 2021-05-03 (×2): .2 mg via INTRAVENOUS

## 2021-05-03 MED ORDER — PRAVASTATIN SODIUM 10 MG PO TABS
10.0000 mg | ORAL_TABLET | Freq: Every day | ORAL | Status: DC
  Administered 2021-05-03: 10 mg via ORAL
  Filled 2021-05-03: qty 1

## 2021-05-03 MED ORDER — PHENYLEPHRINE HCL-NACL 10-0.9 MG/250ML-% IV SOLN
INTRAVENOUS | Status: DC | PRN
  Administered 2021-05-03: 20 ug/min via INTRAVENOUS

## 2021-05-03 MED ORDER — MECLIZINE HCL 25 MG PO TABS
25.0000 mg | ORAL_TABLET | Freq: Three times a day (TID) | ORAL | Status: DC | PRN
  Filled 2021-05-03: qty 1

## 2021-05-03 MED ORDER — MORPHINE SULFATE (PF) 2 MG/ML IV SOLN
2.0000 mg | INTRAVENOUS | Status: DC | PRN
  Administered 2021-05-03: 2 mg via INTRAVENOUS
  Filled 2021-05-03: qty 1

## 2021-05-03 MED ORDER — ROCURONIUM BROMIDE 10 MG/ML (PF) SYRINGE
PREFILLED_SYRINGE | INTRAVENOUS | Status: DC | PRN
  Administered 2021-05-03: 90 mg via INTRAVENOUS

## 2021-05-03 MED ORDER — ESMOLOL HCL 100 MG/10ML IV SOLN
INTRAVENOUS | Status: DC | PRN
  Administered 2021-05-03: 20 mg via INTRAVENOUS

## 2021-05-03 MED ORDER — ALUM & MAG HYDROXIDE-SIMETH 200-200-20 MG/5ML PO SUSP
15.0000 mL | ORAL | Status: DC | PRN
  Administered 2021-05-03: 30 mL via ORAL
  Filled 2021-05-03: qty 30

## 2021-05-03 MED ORDER — LABETALOL HCL 5 MG/ML IV SOLN: 10.0000 mg | INTRAVENOUS | Status: DC | PRN

## 2021-05-03 MED ORDER — SODIUM CHLORIDE 0.9 % IV SOLN
INTRAVENOUS | Status: DC | PRN
  Administered 2021-05-03: 500 mL

## 2021-05-03 MED ORDER — FENTANYL CITRATE (PF) 250 MCG/5ML IJ SOLN
INTRAMUSCULAR | Status: AC
  Filled 2021-05-03: qty 5

## 2021-05-03 MED ORDER — AMISULPRIDE (ANTIEMETIC) 5 MG/2ML IV SOLN: 10.0000 mg | Freq: Once | INTRAVENOUS | Status: DC | PRN

## 2021-05-03 MED ORDER — CHLORHEXIDINE GLUCONATE CLOTH 2 % EX PADS: 6.0000 | MEDICATED_PAD | Freq: Once | CUTANEOUS | Status: DC

## 2021-05-03 MED ORDER — PHENOL 1.4 % MT LIQD
1.0000 | OROMUCOSAL | Status: DC | PRN
  Administered 2021-05-03: 1 via OROMUCOSAL
  Filled 2021-05-03: qty 177

## 2021-05-03 MED ORDER — PANTOPRAZOLE SODIUM 40 MG PO TBEC
40.0000 mg | DELAYED_RELEASE_TABLET | Freq: Every day | ORAL | Status: DC
  Administered 2021-05-03 – 2021-05-04 (×2): 40 mg via ORAL
  Filled 2021-05-03 (×3): qty 1

## 2021-05-03 MED ORDER — ONDANSETRON HCL 4 MG/2ML IJ SOLN
INTRAMUSCULAR | Status: DC | PRN
  Administered 2021-05-03: 4 mg via INTRAVENOUS

## 2021-05-03 MED ORDER — DEXAMETHASONE SODIUM PHOSPHATE 10 MG/ML IJ SOLN
INTRAMUSCULAR | Status: AC
  Filled 2021-05-03: qty 1

## 2021-05-03 MED ORDER — METOPROLOL TARTRATE 5 MG/5ML IV SOLN: 2.0000 mg | INTRAVENOUS | Status: DC | PRN

## 2021-05-03 MED ORDER — PROMETHAZINE HCL 25 MG/ML IJ SOLN
INTRAMUSCULAR | Status: AC
  Filled 2021-05-03: qty 1

## 2021-05-03 MED ORDER — LIDOCAINE HCL (PF) 2 % IJ SOLN
INTRAMUSCULAR | Status: DC | PRN
  Administered 2021-05-03: 50 mg via INTRADERMAL

## 2021-05-03 MED ORDER — DEXAMETHASONE SODIUM PHOSPHATE 10 MG/ML IJ SOLN
INTRAMUSCULAR | Status: DC | PRN
  Administered 2021-05-03: 10 mg via INTRAVENOUS

## 2021-05-03 MED ORDER — OXYCODONE HCL 5 MG PO TABS: 5.0000 mg | ORAL_TABLET | Freq: Once | ORAL | Status: DC | PRN

## 2021-05-03 MED ORDER — OXYCODONE-ACETAMINOPHEN 5-325 MG PO TABS
1.0000 | ORAL_TABLET | ORAL | Status: DC | PRN
  Administered 2021-05-03 – 2021-05-04 (×3): 2 via ORAL
  Filled 2021-05-03 (×3): qty 2

## 2021-05-03 MED ORDER — LIDOCAINE HCL (PF) 2 % IJ SOLN
INTRAMUSCULAR | Status: AC
  Filled 2021-05-03: qty 5

## 2021-05-03 MED ORDER — ACETAMINOPHEN 650 MG RE SUPP
325.0000 mg | RECTAL | Status: DC | PRN
Start: 2021-05-03 — End: 2021-05-04

## 2021-05-03 MED ORDER — LIDOCAINE HCL (PF) 1 % IJ SOLN
INTRAMUSCULAR | Status: AC
  Filled 2021-05-03: qty 5

## 2021-05-03 MED ORDER — CEFAZOLIN SODIUM-DEXTROSE 2-4 GM/100ML-% IV SOLN
2.0000 g | Freq: Three times a day (TID) | INTRAVENOUS | Status: AC
  Administered 2021-05-03 – 2021-05-04 (×2): 2 g via INTRAVENOUS
  Filled 2021-05-03 (×2): qty 100

## 2021-05-03 MED ORDER — PROTAMINE SULFATE 10 MG/ML IV SOLN
INTRAVENOUS | Status: DC | PRN
  Administered 2021-05-03: 10 mg via INTRAVENOUS
  Administered 2021-05-03: 40 mg via INTRAVENOUS

## 2021-05-03 MED ORDER — POTASSIUM CHLORIDE CRYS ER 20 MEQ PO TBCR: 20.0000 meq | EXTENDED_RELEASE_TABLET | Freq: Every day | ORAL | Status: DC | PRN

## 2021-05-03 MED ORDER — HEPARIN SODIUM (PORCINE) 1000 UNIT/ML IJ SOLN
INTRAMUSCULAR | Status: DC | PRN
  Administered 2021-05-03: 9000 [IU] via INTRAVENOUS

## 2021-05-03 MED ORDER — CEFAZOLIN SODIUM-DEXTROSE 2-4 GM/100ML-% IV SOLN
2.0000 g | INTRAVENOUS | Status: AC
  Administered 2021-05-03: 2 g via INTRAVENOUS
  Filled 2021-05-03: qty 100

## 2021-05-03 SURGICAL SUPPLY — 39 items
CANISTER SUCT 3000ML PPV (MISCELLANEOUS) ×3 IMPLANT
CANNULA VESSEL 3MM 2 BLNT TIP (CANNULA) ×6 IMPLANT
CATH ROBINSON RED A/P 18FR (CATHETERS) ×3 IMPLANT
CLIP LIGATING EXTRA MED SLVR (CLIP) ×3 IMPLANT
CLIP LIGATING EXTRA SM BLUE (MISCELLANEOUS) ×3 IMPLANT
COVER WAND RF STERILE (DRAPES) IMPLANT
DECANTER SPIKE VIAL GLASS SM (MISCELLANEOUS) IMPLANT
DERMABOND ADVANCED (GAUZE/BANDAGES/DRESSINGS) ×2
DERMABOND ADVANCED .7 DNX12 (GAUZE/BANDAGES/DRESSINGS) ×1 IMPLANT
DRAIN CHANNEL 10F 3/8 F FF (DRAIN) IMPLANT
DRAIN HEMOVAC 1/8 X 5 (WOUND CARE) IMPLANT
ELECT REM PT RETURN 9FT ADLT (ELECTROSURGICAL) ×3
ELECTRODE REM PT RTRN 9FT ADLT (ELECTROSURGICAL) ×1 IMPLANT
EVACUATOR SILICONE 100CC (DRAIN) IMPLANT
GLOVE SS BIOGEL STRL SZ 7.5 (GLOVE) ×1 IMPLANT
GLOVE SUPERSENSE BIOGEL SZ 7.5 (GLOVE) ×2
GOWN STRL REUS W/ TWL LRG LVL3 (GOWN DISPOSABLE) ×3 IMPLANT
GOWN STRL REUS W/TWL LRG LVL3 (GOWN DISPOSABLE) ×6
KIT BASIN OR (CUSTOM PROCEDURE TRAY) ×3 IMPLANT
KIT SHUNT ARGYLE CAROTID ART 6 (VASCULAR PRODUCTS) ×3 IMPLANT
KIT TURNOVER KIT B (KITS) ×3 IMPLANT
NEEDLE 22X1 1/2 (OR ONLY) (NEEDLE) IMPLANT
NS IRRIG 1000ML POUR BTL (IV SOLUTION) ×6 IMPLANT
PACK CAROTID (CUSTOM PROCEDURE TRAY) ×3 IMPLANT
PAD ARMBOARD 7.5X6 YLW CONV (MISCELLANEOUS) ×6 IMPLANT
PATCH HEMASHIELD 8X75 (Vascular Products) ×3 IMPLANT
POSITIONER HEAD DONUT 9IN (MISCELLANEOUS) ×3 IMPLANT
SHUNT CAROTID BYPASS 10 (VASCULAR PRODUCTS) IMPLANT
SHUNT CAROTID BYPASS 12FRX15.5 (VASCULAR PRODUCTS) IMPLANT
SUT ETHILON 3 0 PS 1 (SUTURE) IMPLANT
SUT PROLENE 6 0 CC (SUTURE) ×3 IMPLANT
SUT SILK 3 0 (SUTURE)
SUT SILK 3-0 18XBRD TIE 12 (SUTURE) IMPLANT
SUT VIC AB 3-0 SH 27 (SUTURE) ×4
SUT VIC AB 3-0 SH 27X BRD (SUTURE) ×2 IMPLANT
SUT VICRYL 4-0 PS2 18IN ABS (SUTURE) ×3 IMPLANT
SYR CONTROL 10ML LL (SYRINGE) IMPLANT
TOWEL GREEN STERILE (TOWEL DISPOSABLE) ×3 IMPLANT
WATER STERILE IRR 1000ML POUR (IV SOLUTION) ×3 IMPLANT

## 2021-05-03 NOTE — Progress Notes (Signed)
  Day of Surgery Note    Subjective:  says he's cold.    Vitals:   05/03/21 1240 05/03/21 1255  BP: (!) 90/50 (!) 113/40  Pulse: 81 66  Resp: 19 11  Temp:    SpO2: 96% 93%    Incisions:   clean and dry Neuro/Extremities:  moving all extremities equally; tongue midline Cardiac:  regular Lungs:  non labored    Assessment/Plan:  This is a 80 y.o. male who is s/p  Left carotid endarterectomy  -pt doing well in recovery and neuro in tact -HR has drifted down to the 40's and 50's a couple of times but has bounced back quickly.  Not uncommon after CEA.  Asymptomatic.   Continue to monitor.  Pt is not on beta blocker.  -to 4 east later this afternoon -if doing no evidence of bleeding tomorrow, can restart Eliqius.   Doreatha Massed, PA-C 05/03/2021 1:15 PM [phone]

## 2021-05-03 NOTE — Progress Notes (Signed)
Pt. Did not want anyone to get text messages.   Pt. Reporting he's had dull chest pain "2" on scale 0-10 since this am. Stated it's in the middle. States it comes and goes. He could not tell me how often this happens and how long it's been going on. Notified Dr. Hyacinth Meeker, orders to get ekg. Continue to monitor.  Pt. States he is no longer having this pain.

## 2021-05-03 NOTE — Transfer of Care (Signed)
Immediate Anesthesia Transfer of Care Note  Patient: Kevin Gonzales  Procedure(s) Performed: LEFT CAROTID ENDARTERECTOMY WITH PATCH ANGIOPLASTY (Left: Neck)  Patient Location: PACU  Anesthesia Type:General  Level of Consciousness: sedated and responds to stimulation  Airway & Oxygen Therapy: Patient Spontanous Breathing and Patient connected to face mask oxygen  Post-op Assessment: Report given to RN, Post -op Vital signs reviewed and stable and Patient moving all extremities  Post vital signs: Reviewed and stable  Last Vitals:  Vitals Value Taken Time  BP 100/56 05/03/21 1153  Temp    Pulse 75 05/03/21 1157  Resp 3 05/03/21 1157  SpO2 94 % 05/03/21 1157  Vitals shown include unvalidated device data.  Last Pain:  Vitals:   05/03/21 0741  TempSrc:   PainSc: 2       Patients Stated Pain Goal:  (" pt. doesn't know") (05/03/21 0741)  Complications: No notable events documented.

## 2021-05-03 NOTE — Interval H&P Note (Signed)
History and Physical Interval Note:  05/03/2021 8:21 AM  Kevin Gonzales  has presented today for surgery, with the diagnosis of LEFT CAROTID STENOSIS.  The various methods of treatment have been discussed with the patient and family. After consideration of risks, benefits and other options for treatment, the patient has consented to  Procedure(s): LEFT CAROTID ENDARTERECTOMY (Left) as a surgical intervention.  The patient's history has been reviewed, patient examined, no change in status, stable for surgery.  I have reviewed the patient's chart and labs.  Questions were answered to the patient's satisfaction.     [name]

## 2021-05-03 NOTE — Anesthesia Procedure Notes (Signed)
Procedure Name: Intubation Date/Time: 05/03/2021 9:08 AM Performed by: Myna Bright, CRNA Pre-anesthesia Checklist: Patient identified, Emergency Drugs available, Suction available and Patient being monitored Patient Re-evaluated:Patient Re-evaluated prior to induction Oxygen Delivery Method: Circle system utilized Preoxygenation: Pre-oxygenation with 100% oxygen Induction Type: IV induction Ventilation: Mask ventilation without difficulty Laryngoscope Size: Mac and 4 Grade View: Grade I Tube type: Oral Tube size: 7.5 mm Number of attempts: 1 Airway Equipment and Method: Stylet Placement Confirmation: ETT inserted through vocal cords under direct vision, positive ETCO2 and breath sounds checked- equal and bilateral Secured at: 22 cm Tube secured with: Tape Dental Injury: Teeth and Oropharynx as per pre-operative assessment

## 2021-05-03 NOTE — Op Note (Addendum)
   OPERATIVE REPORT  DATE OF SURGERY: 05/03/2021  PATIENT: Kevin Gonzales, 80 y.o. male MRN: [id number]  DOB: 1941-08-03  PRE-OPERATIVE DIAGNOSIS: Left Carotid Stenosis, Symptomatic  POST-OPERATIVE DIAGNOSIS:  Same  PROCEDURE:  Left Carotid Endarterectomy with Dacron Patch Angioplasty  SURGEON:  [name], M.D.  PHYSICIAN ASSISTANT: Edilia Bo, MD, [facility]  The assistant was needed for exposure and to expedite the case  ANESTHESIA:   general  EBL: Less than 200 ml  Total I/O In: 1700 [I.V.:1700] Out: 30 [Blood:30]  BLOOD ADMINISTERED: none  DRAINS: none   SPECIMEN: none  COUNTS CORRECT:  YES  PLAN OF CARE: Admit to inpatient   PATIENT DISPOSITION:  PACU - hemodynamically stable and neurologically intact.  PROCEDURE DETAILS: The patient was taken to the operating room placed in supine position.  General anesthesia was administered.  The neck was prepped and draped in the usual sterile fashion.  An incision was made anterior to the sternocleidomastoid and carried down through the platysma with electrocautery.  The sternocleidomastoid was reflected posteriorly and the carotid sheath was opened.  The facial vein was ligated with 2-0 silk ties and divided.  The common carotid artery was encircled with an umbilical tape and Rummel tourniquet.  The vagus nerve was identified and preserved.  Dissection was continued onto the carotid bifurcation.  The superior thyroid artery was encircled with a 2-0 silk Potts tie.  The external carotid was encircled with a blue vessel loop and the internal carotid was encircled with an umbilical tape and Rummel tourniquet.  The hypoglossal nerve was identified and preserved.  The patient was given systemic heparin and after adequate circulation time, the internal, external and common carotid arteries were occluded with vascular clamps.  The common carotid artery was opened with an 11 blade and extended  longitudinally with Potts scissors.  A 10  shunt was passed up the internal carotid and allowed to backbleed.  It was then passed down the common carotid where it was secured with Rummel tourniquet.  The endarterectomy was begun on the common carotid artery and the plaque was divided proximally with Potts scissors.  The endarterectomy was continued onto the bifurcation.  The external carotid was endarterectomized with an eversion technique and the internal carotid was endarterectomized in an open fashion.  Remaining atheromatous debris was removed from the endarterectomy plane.  A Finesse Hemashield Dacron patch was brought onto the field and was sewn as a patch angioplasty with a running 6-0 Prolene suture.  Prior to completion of the closure the shunt was removed and the usual flushing maneuvers were undertaken.  The anastomosis was completed and flow was restored first to the external and then the internal carotid artery.  Excellent flow characteristics were noted with hand-held Doppler in the internal and external carotid arteries.  The patient was given 50 mg of protamine to reverse the heparin.  The wounds were irrigated with saline.  Hemostasis was obtained with electrocautery.  The wounds were closed with 3-0 Vicryl to reapproximate the sternocleidomastoid over the carotid sheath.  The platysma was lysed with a running 3-0 Vicryl suture.  The skin was closed with a 4-0 subcuticular Vicryl stitch.  Dermabond was applied.  The patient was awakened neurologically intact in the operating room and transferred to the recovery room in stable condition   [name], M.D. 05/03/2021 11:53 AM

## 2021-05-03 NOTE — Anesthesia Procedure Notes (Signed)
Arterial Line Insertion Start/End6/21/2022 8:38 AM Performed by: Drema Pry, CRNA, CRNA  Patient location: Pre-op. Preanesthetic checklist: patient identified, IV checked, monitors and equipment checked and pre-op evaluation Lidocaine 1% used for infiltration Left, radial was placed Catheter size: 20 G Hand hygiene performed  and maximum sterile barriers used   Attempts: 1 Procedure performed without using ultrasound guided technique. Following insertion, dressing applied and Biopatch. Post procedure assessment: normal

## 2021-05-03 NOTE — Progress Notes (Signed)
Pt arrived from PACU, VSS, CHG complete, Oriented to unit, Telebox [id number]. Will continue to monitor. Neck Level 0 with no bleeding.   Kalman Jewels, RN

## 2021-05-03 NOTE — Discharge Instructions (Addendum)
[facility]  Discharge Instructions   Carotid Surgery  Please refer to the following instructions for your post-procedure care. Your surgeon or physician assistant will discuss any changes with you.  Activity  You are encouraged to walk as much as you can. You can slowly return to normal activities but must avoid strenuous activity and heavy lifting until your doctor tell you it's okay. Avoid activities such as vacuuming or swinging a golf club. You can drive after one week if you are comfortable and you are no longer taking prescription pain medications. It is normal to feel tired for serval weeks after your surgery. It is also normal to have difficulty with sleep habits, eating, and bowel movements after surgery. These will go away with time.  Bathing/Showering  Shower daily after you go home. Do not soak in a bathtub, hot tub, or swim until the incision heals completely.  Incision Care  Shower every day. Clean your incision with mild soap and water. Pat the area dry with a clean towel. You do not need a bandage unless otherwise instructed. Do not apply any ointments or creams to your incision. You may have skin glue on your incision. Do not peel it off. It will come off on its own in about one week. Your incision may feel thickened and raised for several weeks after your surgery. This is normal and the skin will soften over time.   For Men Only: It's okay to shave around the incision but do not shave the incision itself for 2 weeks. It is common to have numbness under your chin that could last for several months.  Diet  Resume your normal diet. There are no special food restrictions following this procedure. A low fat/low cholesterol diet is recommended for all patients with vascular disease. In order to heal from your surgery, it is CRITICAL to get adequate nutrition. Your body requires vitamins, minerals, and protein. Vegetables are the best source of  vitamins and minerals. Vegetables also provide the perfect balance of protein. Processed food has little nutritional value, so try to avoid this.  Medications  Resume taking all of your medications unless your doctor or physician assistant tells you not to. If your incision is causing pain, you may take over-the- counter pain relievers such as acetaminophen (Tylenol). If you were prescribed a stronger pain medication, please be aware these medications can cause nausea and constipation. Prevent nausea by taking the medication with a snack or meal. Avoid constipation by drinking plenty of fluids and eating foods with a high amount of fiber, such as fruits, vegetables, and grains.  Do not take Tylenol if you are taking prescription pain medications.  Follow Up  Our office will schedule a follow up appointment 2-3 weeks following discharge.  Please call us immediately for any of the following conditions  Increased pain, redness, drainage (pus) from your incision site. Fever of 101 degrees or higher. If you should develop stroke (slurred speech, difficulty swallowing, weakness on one side of your body, loss of vision) you should call 911 and go to the nearest emergency room.  Reduce your risk of vascular disease:  Stop smoking. If you would like help call [facility] at 1-800-QUIT-NOW ((534)497-3424) or [place] at [phone]. Manage your cholesterol Maintain a desired weight Control your diabetes Keep your blood pressure down  If you have any questions, please call the office at [phone].  Information on my medicine - ELIQUIS (apixaban)  This medication education was  reviewed with me or my healthcare representative as part of my discharge preparation.  The pharmacist that spoke with me during my hospital stay was:    Why was Eliquis prescribed for you? Eliquis was prescribed for you to reduce the risk of a blood clot forming that can cause a stroke if you have a medical  condition called atrial fibrillation (a type of irregular heartbeat).  What do You need to know about Eliquis ? Take your Eliquis TWICE DAILY - one tablet in the morning and one tablet in the evening with or without food. If you have difficulty swallowing the tablet whole please discuss with your pharmacist how to take the medication safely.  Take Eliquis exactly as prescribed by your doctor and DO NOT stop taking Eliquis without talking to the doctor who prescribed the medication.  Stopping may increase your risk of developing a stroke.  Refill your prescription before you run out.  After discharge, you should have regular check-up appointments with your healthcare provider that is prescribing your Eliquis.  In the future your dose may need to be changed if your kidney function or weight changes by a significant amount or as you get older.  What do you do if you miss a dose? If you miss a dose, take it as soon as you remember on the same day and resume taking twice daily.  Do not take more than one dose of ELIQUIS at the same time to make up a missed dose.  Important Safety Information A possible side effect of Eliquis is bleeding. You should call your healthcare provider right away if you experience any of the following: Bleeding from an injury or your nose that does not stop. Unusual colored urine (red or dark brown) or unusual colored stools (red or black). Unusual bruising for unknown reasons. A serious fall or if you hit your head (even if there is no bleeding).  Some medicines may interact with Eliquis and might increase your risk of bleeding or clotting while on Eliquis. To help avoid this, consult your healthcare provider or pharmacist prior to using any new prescription or non-prescription medications, including herbals, vitamins, non-steroidal anti-inflammatory drugs (NSAIDs) and supplements.  This website has more information on Eliquis (apixaban):  http://www.eliquis.com/eliquis/home

## 2021-05-03 NOTE — Anesthesia Postprocedure Evaluation (Signed)
Anesthesia Post Note  Patient: Kevin Gonzales  Procedure(s) Performed: LEFT CAROTID ENDARTERECTOMY WITH PATCH ANGIOPLASTY (Left: Neck)     Patient location during evaluation: PACU Anesthesia Type: General Level of consciousness: awake and alert Pain management: pain level controlled Vital Signs Assessment: post-procedure vital signs reviewed and stable Respiratory status: spontaneous breathing, nonlabored ventilation and respiratory function stable Cardiovascular status: blood pressure returned to baseline and stable Postop Assessment: no apparent nausea or vomiting Anesthetic complications: no   No notable events documented.  Last Vitals:  Vitals:   05/03/21 1315 05/03/21 1323  BP:  (!) 98/46  Pulse: 80 76  Resp: (!) 23 12  Temp:    SpO2: 95% 96%    Last Pain:  Vitals:   05/03/21 1255  TempSrc:   PainSc: Asleep                 Lowella Curb

## 2021-05-04 ENCOUNTER — Encounter (HOSPITAL_COMMUNITY): Payer: Self-pay | Admitting: Vascular Surgery

## 2021-05-04 LAB — BASIC METABOLIC PANEL
Anion gap: 8 (ref 5–15)
BUN: 24 mg/dL — ABNORMAL HIGH (ref 8–23)
CO2: 24 mmol/L (ref 22–32)
Calcium: 8.2 mg/dL — ABNORMAL LOW (ref 8.9–10.3)
Chloride: 104 mmol/L (ref 98–111)
Creatinine, Ser: 1.07 mg/dL (ref 0.61–1.24)
GFR, Estimated: >60 mL/min (ref >60)
Glucose, Bld: 137 mg/dL — ABNORMAL HIGH (ref 70–99)
Potassium: 4.3 mmol/L (ref 3.5–5.1)
Sodium: 136 mmol/L (ref 135–145)

## 2021-05-04 LAB — CBC
HCT: 34.4 % — ABNORMAL LOW (ref 39.0–52.0)
Hemoglobin: 11.5 g/dL — ABNORMAL LOW (ref 13.0–17.0)
MCH: 32.5 pg (ref 26.0–34.0)
MCHC: 33.4 g/dL (ref 30.0–36.0)
MCV: 97.2 fL (ref 80.0–100.0)
Platelets: 174 10*3/uL (ref 150–400)
RBC: 3.54 MIL/uL — ABNORMAL LOW (ref 4.22–5.81)
RDW: 11.8 % (ref 11.5–15.5)
WBC: 9.9 10*3/uL (ref 4.0–10.5)
nRBC: 0 % (ref 0.0–0.2)

## 2021-05-04 MED ORDER — APIXABAN 5 MG PO TABS
5.0000 mg | ORAL_TABLET | Freq: Two times a day (BID) | ORAL | Status: DC
  Administered 2021-05-04: 5 mg via ORAL
  Filled 2021-05-04: qty 1

## 2021-05-04 MED ORDER — HYDROCODONE-ACETAMINOPHEN 7.5-325 MG PO TABS: 1.0000 | ORAL_TABLET | Freq: Four times a day (QID) | ORAL | 0 refills | Status: DC | PRN

## 2021-05-04 MED ORDER — MAGNESIUM SULFATE IN D5W 1-5 GM/100ML-% IV SOLN
1.0000 g | Freq: Once | INTRAVENOUS | Status: AC
  Administered 2021-05-04: 1 g via INTRAVENOUS
  Filled 2021-05-04: qty 100

## 2021-05-04 NOTE — Progress Notes (Signed)
Ok to resume apixaban and give magnesium 1g x1 since he is on Tikosyn per [employer].  Ulyses Southward, PharmD, BCIDP, AAHIVP, CPP Infectious Disease Pharmacist 05/04/2021 8:42 AM

## 2021-05-04 NOTE — Plan of Care (Signed)
  Problem: Education: Goal: Knowledge of General Education information will improve Description: Including pain rating scale, medication(s)/side effects and non-pharmacologic comfort measures Outcome: Completed/Met

## 2021-05-04 NOTE — Progress Notes (Signed)
Pt discharge order MD order. IV and tele removed. Discharge instructions reviewed with pt. Questions answered to satisfaction, pt verbalized understanding. Pt escorted to personal  vehicle by volunteer.   Ndea Kilroy M

## 2021-05-04 NOTE — Progress Notes (Addendum)
  Progress Note    05/04/2021 7:42 AM 1 Day Post-Op  Subjective:  denies slurring speech, changes in vision, or one sided weakness.  Hoarse voice   Vitals:   05/04/21 0330 05/04/21 0442  BP: (!) 108/54 (!) 108/54  Pulse: 84 84  Resp: 20 20  Temp: 98.2 F (36.8 C) 98.2 F (36.8 C)  SpO2: 96% 96%   Physical Exam: Lungs:  non labored Incisions:  L neck incision c/d/i Extremities:  moving all extremities well Neurologic: A&O; L lower lip weakness  CBC    Component Value Date/Time   WBC 9.9 05/04/2021 0149   RBC 3.54 (L) 05/04/2021 0149   HGB 11.5 (L) 05/04/2021 0149   HGB 14.4 05/02/2017 1443   HCT 34.4 (L) 05/04/2021 0149   HCT 42.5 05/02/2017 1443   PLT 174 05/04/2021 0149   PLT 246 05/02/2017 1443   MCV 97.2 05/04/2021 0149   MCV 92 05/02/2017 1443   MCH 32.5 05/04/2021 0149   MCHC 33.4 05/04/2021 0149   RDW 11.8 05/04/2021 0149   RDW 13.0 05/02/2017 1443   LYMPHSABS 1.7 05/02/2017 1443   EOSABS 0.2 05/02/2017 1443   BASOSABS 0.1 05/02/2017 1443    BMET    Component Value Date/Time   NA 136 05/04/2021 0149   NA 140 05/02/2017 1443   K 4.3 05/04/2021 0149   CL 104 05/04/2021 0149   CO2 24 05/04/2021 0149   GLUCOSE 137 (H) 05/04/2021 0149   BUN 24 (H) 05/04/2021 0149   BUN 16 05/02/2017 1443   CREATININE 1.07 05/04/2021 0149   CALCIUM 8.2 (L) 05/04/2021 0149   GFRNONAA >60 05/04/2021 0149   GFRAA >60 08/22/2018 1039    INR    Component Value Date/Time   INR 1.1 05/03/2021 0716     Intake/Output Summary (Last 24 hours) at 05/04/2021 0742 Last data filed at 05/04/2021 0500 Gross per 24 hour  Intake 2786.06 ml  Output 30 ml  Net 2756.06 ml     Assessment/Plan:  80 y.o. male is s/p L CEA 1 Day Post-Op   Neuro exam remains at baseline L neck incision unremarkable D/c home later this morning if ambulating ok; follow up in 2-3 weeks for incision check    Emilie Rutter, PA-C [facility] [phone] 05/04/2021 7:42  AM   I have examined the patient, reviewed and agree with above.  [name], MD 05/04/2021 9:52 AM

## 2021-05-05 NOTE — Discharge Summary (Signed)
Discharge Summary     Kevin Gonzales 07-17-1941 80 y.o. male  [id number]  Admission Date: 05/03/2021  Discharge Date: 05/04/21  Physician: Dr. Arbie Cookey  Admission Diagnosis: Carotid artery stenosis [I65.29]  Discharge Day services:   See progress note 05/04/2021  Hospital Course:  The patient was admitted to the hospital and taken to the operating room on 05/03/2021 and underwent left carotid endarterectomy.  The pt tolerated the procedure well and was transported to the PACU in good condition.   By POD 1, the pt neuro status remained at baseline.  It should be noted that he does have some left lower lip weakness which was somewhat anticipated given the intraoperative findings.  He will follow-up for incision check in the office in 2 to 3 weeks.  He will be prescribed 2 to 3 days of narcotic pain medication for continued postoperative pain control.  He was discharged home in stable condition.   Recent Labs    05/03/21 0716 05/04/21 0149  NA 140 136  K 3.8 4.3  CL 104 104  CO2 23 24  GLUCOSE 123* 137*  BUN 21 24*  CALCIUM 9.2 8.2*   Recent Labs    05/03/21 0716 05/04/21 0149  WBC 5.1 9.9  HGB 13.2 11.5*  HCT 40.5 34.4*  PLT 207 174   Recent Labs    05/03/21 0716  INR 1.1       Discharge Diagnosis:  Carotid artery stenosis [I65.29]  Secondary Diagnosis: Patient Active Problem List   Diagnosis Date Noted   Sinus node dysfunction (HCC) 10/01/2019   Visit for monitoring Tikosyn therapy 08/12/2018   Persistent atrial fibrillation (HCC)    Paroxysmal atrial fibrillation (HCC) 04/17/2018   Carotid artery stenosis, asymptomatic, right 03/29/2018   Carotid artery stenosis 03/19/2018   Disequilibrium 06/14/2017   Chest pain    Essential hypertension 05/02/2017   GERD (gastroesophageal reflux disease) 04/30/2017   History of infectious diarrhea 08/18/2016   Acute myocardial infarction (HCC) 07/01/2014   Allergic rhinitis 07/01/2014   Benign prostatic  hyperplasia 07/01/2014   BPPV (benign paroxysmal positional vertigo) 07/01/2014   Coronary artery disease 07/01/2014   Esophageal reflux 07/01/2014   Hearing loss 07/01/2014   Tinnitus of both ears 07/01/2014   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   Hypercholesterolemia 07/01/2014   PSA elevation 04/29/2014   Hypertrophy of prostate with urinary obstruction and other lower urinary tract symptoms (LUTS) 04/29/2014   Past Medical History:  Diagnosis Date   Abdominal obesity    Alcohol abuse    Arthritis    Atrial fibrillation (HCC)    Carotid artery occlusion    Carotid bruit    Chest pain    Chondrodermatitis nodularis helicis, left    Coronary artery disease    Dyspnea    GERD (gastroesophageal reflux disease)    HOH (hard of hearing)    Hyperlipidemia    Hypertension    Myocardial infarction (HCC) 1991   Thyroid disease    Vitamin D deficiency     Allergies as of 05/04/2021       Reactions   Perflutren Lipid Microsphere Other (See Comments)   Severe lower back, tailbone, and butt pain. Minor chest pain        Medication List     TAKE these medications    apixaban 5 MG Tabs tablet Commonly known as: Eliquis Take 1 tablet (5 mg total) by mouth 2 (two) times daily.   aspirin EC  81 MG tablet Take 81 mg by mouth daily. Swallow whole.   beclomethasone 80 MCG/ACT inhaler Commonly known as: QVAR Inhale 1 puff into the lungs 2 (two) times daily as needed (for shortness of breath).   bismuth subsalicylate 262 MG/15ML suspension Commonly known as: PEPTO BISMOL Take 30 mLs by mouth every 6 (six) hours as needed for indigestion.   calcium carbonate 500 MG chewable tablet Commonly known as: TUMS - dosed in mg elemental calcium Chew 1,000 mg by mouth daily as needed for indigestion or heartburn.   dofetilide 500 MCG capsule Commonly known as: TIKOSYN Take 1 capsule (500 mcg total) by mouth 2 (two) times daily.    finasteride 5 MG tablet Commonly known as: PROSCAR Take 5 mg by mouth daily.   Fish Oil Ultra 1400 MG Caps Take 1,400 mg by mouth daily.   HYDROcodone-acetaminophen 7.5-325 MG tablet Commonly known as: NORCO Take 1 tablet by mouth every 6 (six) hours as needed (for pain.).   ibuprofen 200 MG tablet Commonly known as: ADVIL Take 200-400 mg by mouth daily as needed for moderate pain.   losartan 100 MG tablet Commonly known as: COZAAR Take 100 mg by mouth daily.   meclizine 25 MG tablet Commonly known as: ANTIVERT Take 25 mg by mouth 3 (three) times daily as needed for dizziness.   nitroGLYCERIN 0.4 MG SL tablet Commonly known as: NITROSTAT Place 0.4 mg under the tongue every 5 (five) minutes as needed for chest pain.   omeprazole 20 MG capsule Commonly known as: PRILOSEC Take 20 mg by mouth daily before breakfast.   potassium chloride SA 20 MEQ tablet Commonly known as: KLOR-CON Take 20 mEq by mouth daily.   pravastatin 10 MG tablet Commonly known as: PRAVACHOL Take 10 mg by mouth at bedtime.   Vitamin D (Ergocalciferol) 1.25 MG (50000 UNIT) Caps capsule Commonly known as: DRISDOL Take 50,000 Units by mouth every Sunday.         Discharge Instructions:   [facility] Discharge Instructions Carotid Endarterectomy (CEA)  Please refer to the following instructions for your post-procedure care. Your surgeon or physician assistant will discuss any changes with you.  Activity  You are encouraged to walk as much as you can. You can slowly return to normal activities but must avoid strenuous activity and heavy lifting until your doctor tell you it's OK. Avoid activities such as vacuuming or swinging a golf club. You can drive after one week if you are comfortable and you are no longer taking prescription pain medications. It is normal to feel tired for serval weeks after your surgery. It is also normal to have difficulty with sleep  habits, eating, and bowel movements after surgery. These will go away with time.  Bathing/Showering  You may shower after you come home. Do not soak in a bathtub, hot tub, or swim until the incision heals completely.  Incision Care  Shower every day. Clean your incision with mild soap and water. Pat the area dry with a clean towel. You do not need a bandage unless otherwise instructed. Do not apply any ointments or creams to your incision. You may have skin glue on your incision. Do not peel it off. It will come off on its own in about one week. Your incision may feel thickened and raised for several weeks after your surgery. This is normal and the skin will soften over time. For Men Only: It's OK to shave around the incision but do not  shave the incision itself for 2 weeks. It is common to have numbness under your chin that could last for several months.  Diet  Resume your normal diet. There are no special food restrictions following this procedure. A low fat/low cholesterol diet is recommended for all patients with vascular disease. In order to heal from your surgery, it is CRITICAL to get adequate nutrition. Your body requires vitamins, minerals, and protein. Vegetables are the best source of vitamins and minerals. Vegetables also provide the perfect balance of protein. Processed food has little nutritional value, so try to avoid this.  Medications  Resume taking all of your medications unless your doctor or physician assistant tells you not to.  If your incision is causing pain, you may take over-the- counter pain relievers such as acetaminophen (Tylenol). If you were prescribed a stronger pain medication, please be aware these medications can cause nausea and constipation.  Prevent nausea by taking the medication with a snack or meal. Avoid constipation by drinking plenty of fluids and eating foods with a high amount of fiber, such as fruits, vegetables, and grains. Do not take Tylenol if you  are taking prescription pain medications.  Follow Up  Our office will schedule a follow up appointment 2-3 weeks following discharge.  Please call us immediately for any of the following conditions  Increased pain, redness, drainage (pus) from your incision site. Fever of 101 degrees or higher. If you should develop stroke (slurred speech, difficulty swallowing, weakness on one side of your body, loss of vision) you should call 911 and go to the nearest emergency room.  Reduce your risk of vascular disease:  Stop smoking. If you would like help call [facility] at 1-800-QUIT-NOW ((872) 546-4328) or [place] at [phone]. Manage your cholesterol Maintain a desired weight Control your diabetes Keep your blood pressure down  If you have any questions, please call the office at [phone].  Disposition: home  Patient's condition: is Good  Follow up: [facility] in 2-3 weeks.   Emilie Rutter, PA-C [facility] [phone]   --- For [facility] Registry use ---   Modified Rankin score at D/C (0-6): 0  IV medication needed for:  1. Hypertension: No 2. Hypotension: No  Post-op Complications: No  1. Post-op CVA or TIA: No  If yes: Event classification (right eye, left eye, right cortical, left cortical, verterobasilar, other):   If yes: Timing of event (intra-op, <6 hrs post-op, >=6 hrs post-op, unknown):   2. CN injury: No  If yes: CN  injuried   3. Myocardial infarction: No  If yes: Dx by (EKG or clinical, Troponin):   4.  CHF: No  5.  Dysrhythmia (new): No  6. Wound infection: No  7. Reperfusion symptoms: No  8. Return to OR: No  If yes: return to OR for (bleeding, neurologic, other CEA incision, other):   Discharge medications: Statin use:  Yes ASA use:  Yes   Beta blocker use:  No ACE-Inhibitor use:  No  ARB use:  Yes CCB use: No P2Y12 Antagonist use: No, [ ]  Plavix, [ ]  Plasugrel, [ ]  Ticlopinine, [ ]  Ticagrelor, [ ]  Other, [ ]   No for medical reason, [ ]  Non-compliant, [ ]  Not-indicated Anti-coagulant use:  Yes, [ ]  Warfarin, [ ]  Rivaroxaban, [ ]  Dabigatran, [x]  apixaban

## 2021-05-06 ENCOUNTER — Telehealth: Payer: Self-pay

## 2021-05-06 NOTE — Telephone Encounter (Signed)
Niki from [facility] calls to report that patient's BP monitor said he was having an irregular heartbeat. His BP was 140/80 and heart rate was around the 90s. He has a history of a - fib. Says he denies a home health visit today, but that he sounds like he is okay and she provided patient teaching. Advised her to have patient call cardiology.

## 2021-05-09 ENCOUNTER — Ambulatory Visit: Payer: Medicare Other | Admitting: Surgery

## 2021-05-10 ENCOUNTER — Telehealth: Payer: Self-pay | Admitting: Internal Medicine

## 2021-05-10 NOTE — Telephone Encounter (Signed)
    Heather RN with [facility], she said when she visit pt, pt said his Afib returned,she said pt was asymptomatic while she was there but pt is complaining about Afib, also pt is having urinary frequency and she would like to ask if Dr. Ladona Ridgel can order urine culture and analysis

## 2021-05-11 NOTE — Telephone Encounter (Signed)
Returned call to nurse.  Advised Pt should go back into rhythm on the dofetilide-advised to encourage Pt to keep taking.  Advised if Pt remains in afib for an extended period (days-weeks) it means his medication has stopped working and then she should contact office.  Advised for urinary issues she should contact Pt's PCP.  Gave PCP name.

## 2021-05-17 ENCOUNTER — Telehealth: Payer: Self-pay

## 2021-05-17 NOTE — Telephone Encounter (Signed)
Lauren from [facility] calls today to report UA findings - appears patient has UTI. Advised her to call PCP for further management and provided PCP name. Verbalizes understanding.

## 2021-05-18 ENCOUNTER — Ambulatory Visit (INDEPENDENT_AMBULATORY_CARE_PROVIDER_SITE_OTHER): Payer: Medicare Other | Admitting: Physician Assistant

## 2021-05-18 ENCOUNTER — Other Ambulatory Visit: Payer: Self-pay

## 2021-05-18 VITALS — BP 137/69 | HR 62 | Temp 98.3°F | Resp 20 | Ht 68.0 in | Wt 200.3 lb

## 2021-05-18 DIAGNOSIS — I6523 Occlusion and stenosis of bilateral carotid arteries: Secondary | ICD-10-CM

## 2021-05-18 MED ORDER — NITROFURANTOIN MONOHYD MACRO 100 MG PO CAPS: 100.0000 mg | ORAL_CAPSULE | Freq: Two times a day (BID) | ORAL | 0 refills | Status: AC

## 2021-05-18 NOTE — Progress Notes (Signed)
Postoperative Visit   History of Present Illness   Kevin Gonzales is a 80 y.o. male who presents for postoperative follow-up for: left CEA (Date: 05/03/21) by Dr. Arbie Cookey due to symptomatic carotid stenosis causing amaurosis fugax.  He also had right carotid endarterectomy by Dr. Arbie Cookey in 2019 due to high-grade asymptomatic stenosis.  He denies any strokelike symptoms including vision changes since surgery.  The incision of his left neck is still sore however it is improving.  His main complaint is of frequency with urination and he believes he has a urinary tract infection.  He attempted to call his urologist however they could not accommodate him in a timely manner.  Current Outpatient Medications  Medication Sig Dispense Refill   nitrofurantoin, macrocrystal-monohydrate, (MACROBID) 100 MG capsule Take 1 capsule (100 mg total) by mouth 2 (two) times daily for 7 days. 14 capsule 0   apixaban (ELIQUIS) 5 MG TABS tablet Take 1 tablet (5 mg total) by mouth 2 (two) times daily. 180 tablet 3   aspirin EC 81 MG tablet Take 81 mg by mouth daily. Swallow whole.     beclomethasone (QVAR) 80 MCG/ACT inhaler Inhale 1 puff into the lungs 2 (two) times daily as needed (for shortness of breath).      bismuth subsalicylate (PEPTO BISMOL) 262 MG/15ML suspension Take 30 mLs by mouth every 6 (six) hours as needed for indigestion.     calcium carbonate (TUMS - DOSED IN MG ELEMENTAL CALCIUM) 500 MG chewable tablet Chew 1,000 mg by mouth daily as needed for indigestion or heartburn.     dofetilide (TIKOSYN) 500 MCG capsule Take 1 capsule (500 mcg total) by mouth 2 (two) times daily. 180 capsule 3   finasteride (PROSCAR) 5 MG tablet Take 5 mg by mouth daily.     HYDROcodone-acetaminophen (NORCO) 7.5-325 MG tablet Take 1 tablet by mouth every 6 (six) hours as needed (for pain.). 15 tablet 0   ibuprofen (ADVIL,MOTRIN) 200 MG tablet Take 200-400 mg by mouth daily as needed for moderate pain.     losartan (COZAAR) 100  MG tablet Take 100 mg by mouth daily.     meclizine (ANTIVERT) 25 MG tablet Take 25 mg by mouth 3 (three) times daily as needed for dizziness.     nitroGLYCERIN (NITROSTAT) 0.4 MG SL tablet Place 0.4 mg under the tongue every 5 (five) minutes as needed for chest pain.     Omega-3 Fatty Acids (FISH OIL ULTRA) 1400 MG CAPS Take 1,400 mg by mouth daily.     omeprazole (PRILOSEC) 20 MG capsule Take 20 mg by mouth daily before breakfast.      potassium chloride SA (KLOR-CON) 20 MEQ tablet Take 20 mEq by mouth daily.     pravastatin (PRAVACHOL) 10 MG tablet Take 10 mg by mouth at bedtime.     Vitamin D, Ergocalciferol, (DRISDOL) 50000 units CAPS capsule Take 50,000 Units by mouth every Sunday.      No current facility-administered medications for this visit.    For VQI Use Only   PRE-ADM LIVING: Home  AMB STATUS: Ambulatory   Physical Examination   Vitals:   05/18/21 0951 05/18/21 0959  BP: 134/68 137/69  Pulse: 62   Resp: 20   Temp: 98.3 F (36.8 C)   TempSrc: Temporal   SpO2: 98%   Weight: 200 lb 4.8 oz (90.9 kg)   Height: 5\' 8"  (1.727 m)     left Neck: Incision is healing well  Neuro: CN 2-12 are  grossly intact; hoarseness, and left lower lip weakness   Medical Decision Making   Kevin Gonzales is a 80 y.o. male who presents s/p left CEA.  Vision changes have resolved since surgery L neck incision is healing well Ok to slowly work back to a pre-surgery activity level Check carotid duplex in 9 months  Patient was also provided a course of Macrobid for UTI     Emilie Rutter PA-C [facility] Office: [phone]  Clinic MD: Darrick Penna

## 2021-05-19 ENCOUNTER — Other Ambulatory Visit: Payer: Self-pay

## 2021-05-19 DIAGNOSIS — I6523 Occlusion and stenosis of bilateral carotid arteries: Secondary | ICD-10-CM

## 2021-05-27 ENCOUNTER — Telehealth: Payer: Self-pay

## 2021-05-27 NOTE — Telephone Encounter (Signed)
Pt. Reports he received a letter from [facility] with CT results from before his carotid surgery. States he showed this to his PCP. Will keep his copy in his records.

## 2021-09-22 ENCOUNTER — Other Ambulatory Visit: Payer: Self-pay | Admitting: Internal Medicine

## 2021-09-26 IMAGING — CT CT ANGIO NECK
2 of 7 series · 8 of 33 positions shown · IV contrast (OMNIPAQUE)
Comparison: None.

CLINICAL DATA: Carotid stenosis screening.

EXAM:
CT ANGIOGRAPHY NECK
TECHNIQUE: Multidetector CT imaging of the neck was performed using the
standard protocol during bolus administration of intravenous
contrast. Multiplanar CT image reconstructions and MIPs were
obtained to evaluate the vascular anatomy. Carotid stenosis
measurements (when applicable) are obtained utilizing NASCET
criteria, using the distal internal carotid diameter as the
denominator.
CONTRAST:  75mL OMNIPAQUE IOHEXOL 350 MG/ML SOLN

[Series 6: cta neck · axial · 0.46mm/px · z∈[-243,-167]mm · 2 of 114 slices shown]
[im 38/114  soft-tissue]
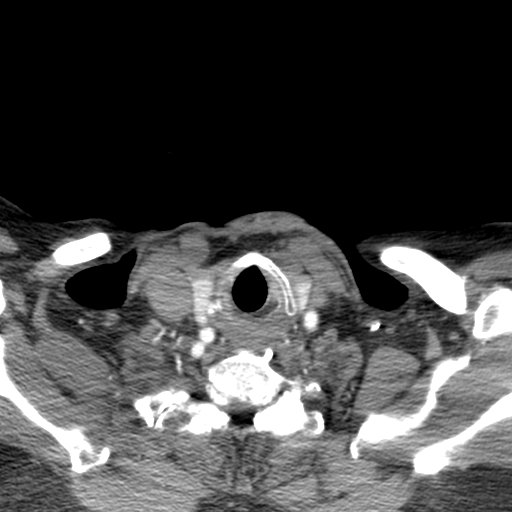
[im 76/114  soft-tissue]
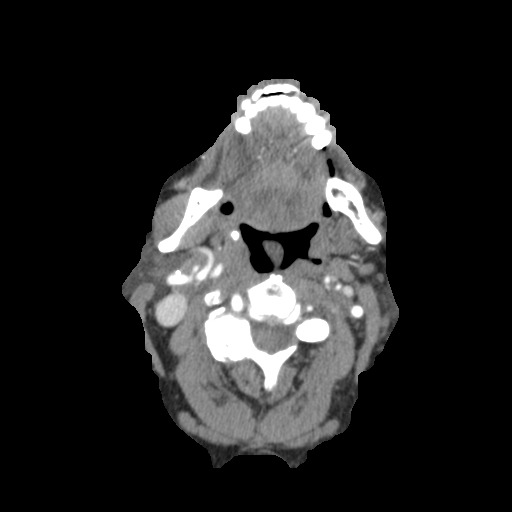

[Series 8: ax thin · axial · 0.44mm/px · z∈[-285,-125]mm · 6 of 226 slices shown]
[im 33/226  soft-tissue]
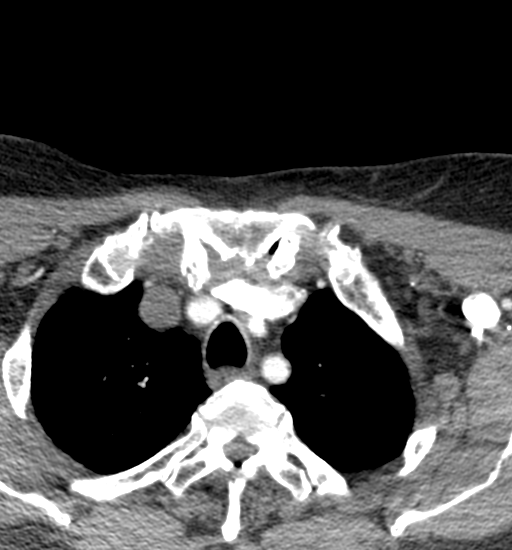
[im 65/226  bone]
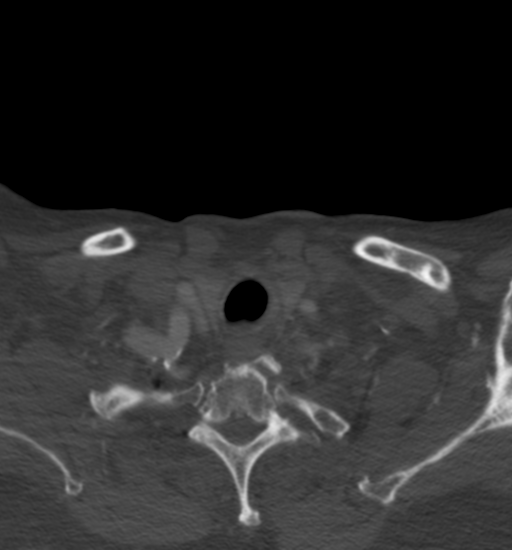
[im 97/226  soft-tissue]
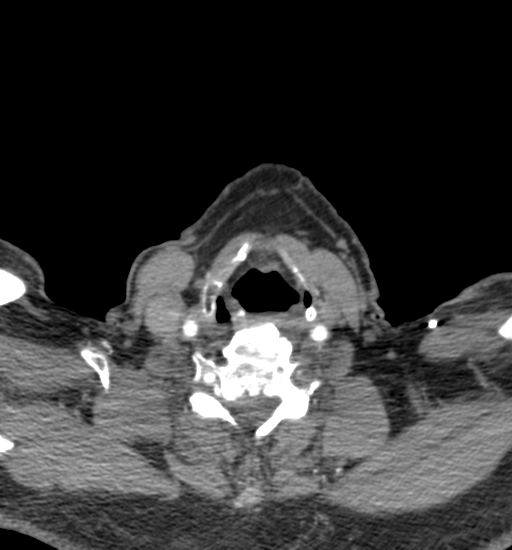
[im 129/226  bone]
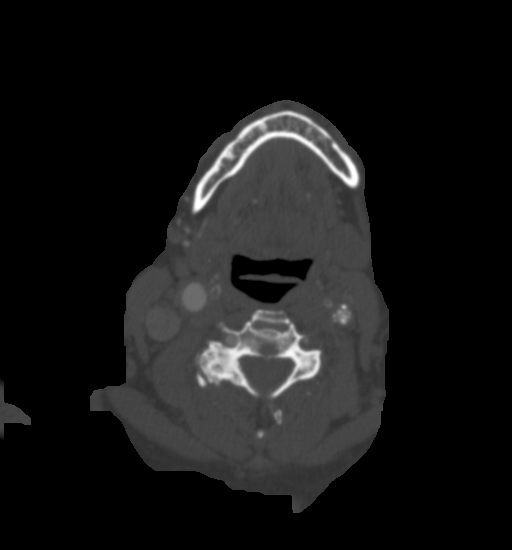
[im 161/226  soft-tissue]
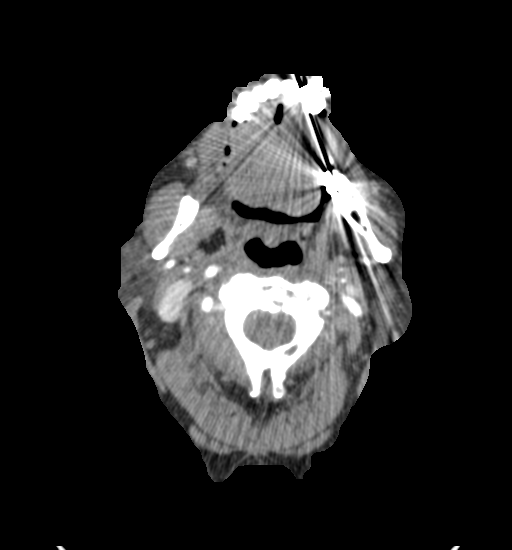
[im 193/226  bone]
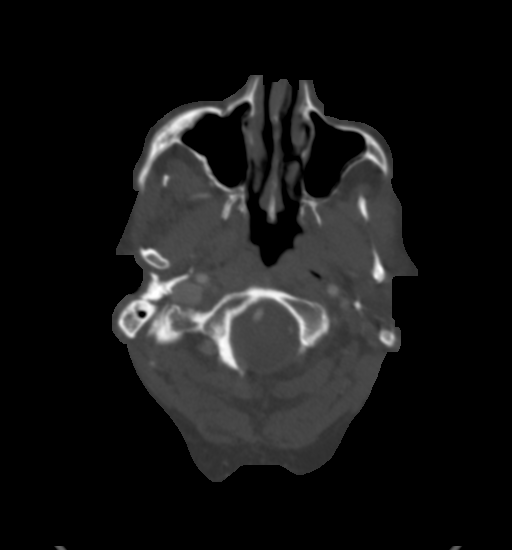

[8 of 33 positions shown; findings below may reference images not displayed]

FINDINGS: Aortic arch: Standard branching. Imaged portion shows no evidence of
aneurysm or dissection. Calcified atherosclerotic changes extending
to the origin of the major arch vessels without hemodynamically
significant stenosis.

Right carotid system: Postsurgical changes from prior
endarterectomy. Mild atherosclerotic changes are noted without
hemodynamically significant stenosis. There is increased tortuosity
of the cervical segment of the right ICA. Calcified plaques are also
seen in the visualized portions of the carotid siphon.

Left carotid system: Prominent atherosclerotic changes with
predominantly calcified plaques resulting in severe stenosis at the
carotid bulb. Calcified plaques are also seen in the visualized
portions of the carotid siphon.

Vertebral arteries: Dominant right vertebral artery with
atherosclerotic changes at its origin resulting in mild stenosis.
Remainder of the course of the right vertebral artery has normal
caliber to the vertebrobasilar junction. Diminutive left vertebral
artery, likely hypoplastic with occlusion at the distal V1 and
proximal V2 segment with reconstitution by muscular branches and
multifocal areas of high-grade stenosis at the V3 and V4 segments.

Skeleton: Postsurgical changes from prior sternotomy. Degenerative
changes of the cervical spine.

Other neck: Negative.

Upper chest: Right apical scarring with a a 1 cm right subpleural
nodule. Correlation with dedicated study suggested.
IMPRESSION: 1. Atherosclerotic changes with prominent calcification in the left
carotid bifurcation resulting severe stenosis at the left carotid
bulb. Please note that the degree of stenosis may be overestimated
due to calcification. Nonetheless, it appears to be hemodynamically
significant.
2. Postsurgical changes from prior endarterectomy in the right
carotid bifurcation. No restenosis.
3. Occlusion at the V1-V2 segment of the left vertebral artery which
appears hypoplastic. High-grade stenosis at the V3 and V4 segments.
4. Mild atherosclerotic changes at the origin of the right vertebral
artery without hemodynamically significant stenosis.
5. A 1 cm right upper lobe subpleural nodule. Correlation with
dedicated study suggested.

## 2021-11-03 ENCOUNTER — Ambulatory Visit: Payer: Medicare Other | Admitting: Student

## 2021-11-25 ENCOUNTER — Telehealth: Payer: Self-pay

## 2021-11-25 NOTE — Telephone Encounter (Signed)
Outreach made to Pt.  Per Pt he is unsure if he had lab work at last office visit.  Call placed to PCP-Pt did not have lab work.  Advised Pt he will need lab work.  He asks we schedule him for an OV end of February.  His wife just recently passed.  Will follow up.

## 2021-12-01 DIAGNOSIS — G8929 Other chronic pain: Secondary | ICD-10-CM | POA: Insufficient documentation

## 2021-12-01 DIAGNOSIS — M5136 Other intervertebral disc degeneration, lumbar region: Secondary | ICD-10-CM | POA: Insufficient documentation

## 2021-12-22 DIAGNOSIS — M48061 Spinal stenosis, lumbar region without neurogenic claudication: Secondary | ICD-10-CM | POA: Insufficient documentation

## 2022-01-09 NOTE — Progress Notes (Addendum)
PCP:  Karle Plumber, MD Primary Cardiologist: Lewayne Bunting, MD Electrophysiologist: Lewayne Bunting, MD   RAMEEN QUINNEY is a 81 y.o. male seen today for Lewayne Bunting, MD for routine electrophysiology followup.  Since last being seen in our clinic the patient reports doing OK. Otherwise history is very limited. He states he had a recent check with his primary care doctor and why can't I just review that instead. Refuses EKG as he had one in December. Relents to getting blood work. he denies chest pain, palpitations, dyspnea, PND, orthopnea, nausea, vomiting, dizziness, syncope, edema, weight gain, or early satiety.  Past Medical History:  Diagnosis Date   Abdominal obesity    Alcohol abuse    Arthritis    Atrial fibrillation (HCC)    Carotid artery occlusion    Carotid bruit    Chest pain    Chondrodermatitis nodularis helicis, left    Coronary artery disease    Dyspnea    GERD (gastroesophageal reflux disease)    HOH (hard of hearing)    Hyperlipidemia    Hypertension    Myocardial infarction (HCC) 1991   Thyroid disease    Vitamin D deficiency    Past Surgical History:  Procedure Laterality Date   CARDIOVERSION N/A 07/25/2018   Procedure: CARDIOVERSION;  Surgeon: Thurmon Fair, MD;  Location: [facility];  Service: Cardiovascular;  Laterality: N/A;   CAROTID ENDARTERECTOMY Right 03/29/2018   CAROTID STENT  2004   CORONARY ARTERY BYPASS GRAFT     1 vessel   ENDARTERECTOMY Right 03/29/2018   Procedure: Right Carotid Endartarectomy;  Surgeon: Larina Earthly, MD;  Location: [facility] OR;  Service: Vascular;  Laterality: Right;   ENDARTERECTOMY Left 05/03/2021   Procedure: LEFT CAROTID ENDARTERECTOMY WITH PATCH ANGIOPLASTY;  Surgeon: Larina Earthly, MD;  Location: [facility] OR;  Service: Vascular;  Laterality: Left;   LEFT HEART CATH AND CORS/GRAFTS ANGIOGRAPHY N/A 05/10/2017   Procedure: Left Heart Cath and Cors/Grafts Angiography;  Surgeon: Runell Gess, MD;  Location: [facility] INVASIVE CV  LAB;  Service: Cardiovascular;  Laterality: N/A;   PATCH ANGIOPLASTY Right 03/29/2018   Procedure: PATCH ANGIOPLASTY;  Surgeon: Larina Earthly, MD;  Location: [facility] OR;  Service: Vascular;  Laterality: Right;    Current Outpatient Medications  Medication Sig Dispense Refill   apixaban (ELIQUIS) 5 MG TABS tablet Take 1 tablet (5 mg total) by mouth 2 (two) times daily. 180 tablet 3   aspirin EC 81 MG tablet Take 81 mg by mouth daily. Swallow whole.     beclomethasone (QVAR) 80 MCG/ACT inhaler Inhale 1 puff into the lungs 2 (two) times daily as needed (for shortness of breath).      bismuth subsalicylate (PEPTO BISMOL) 262 MG/15ML suspension Take 30 mLs by mouth every 6 (six) hours as needed for indigestion.     calcium carbonate (TUMS - DOSED IN MG ELEMENTAL CALCIUM) 500 MG chewable tablet Chew 1,000 mg by mouth daily as needed for indigestion or heartburn.     dofetilide (TIKOSYN) 500 MCG capsule Take 1 capsule (500 mcg total) by mouth 2 (two) times daily. Please make yearly appt with Dr. Ladona Ridgel for December 2022 for future refills. Thank you 1st attempt 180 capsule 0   finasteride (PROSCAR) 5 MG tablet Take 5 mg by mouth daily.     HYDROcodone-acetaminophen (NORCO) 7.5-325 MG tablet Take 1 tablet by mouth every 6 (six) hours as needed (for pain.). 15 tablet 0   ibuprofen (ADVIL,MOTRIN) 200 MG tablet Take 200-400 mg  by mouth daily as needed for moderate pain.     losartan (COZAAR) 100 MG tablet Take 100 mg by mouth daily.     meclizine (ANTIVERT) 25 MG tablet Take 25 mg by mouth 3 (three) times daily as needed for dizziness.     nitroGLYCERIN (NITROSTAT) 0.4 MG SL tablet Place 0.4 mg under the tongue every 5 (five) minutes as needed for chest pain.     Omega-3 Fatty Acids (FISH OIL ULTRA) 1400 MG CAPS Take 1,400 mg by mouth daily.     omeprazole (PRILOSEC) 20 MG capsule Take 20 mg by mouth daily before breakfast.      potassium chloride SA (KLOR-CON) 20 MEQ tablet Take 20 mEq by mouth daily.      pravastatin (PRAVACHOL) 10 MG tablet Take 10 mg by mouth at bedtime.     Vitamin D, Ergocalciferol, (DRISDOL) 50000 units CAPS capsule Take 50,000 Units by mouth every Sunday.      No current facility-administered medications for this visit.    Allergies  Allergen Reactions   Perflutren Lipid Microsphere Other (See Comments)    Severe lower back, tailbone, and butt pain. Minor chest pain    Social History   Socioeconomic History   Marital status: Married    Spouse name: Not on file   Number of children: Not on file   Years of education: Not on file   Highest education level: Not on file  Occupational History   Not on file  Tobacco Use   Smoking status: Former    Years: 32.00    Types: Cigarettes    Quit date: 64    Years since quitting: 32.1   Smokeless tobacco: Never  Vaping Use   Vaping Use: Never used  Substance and Sexual Activity   Alcohol use: Not Currently   Drug use: No   Sexual activity: Not Currently  Other Topics Concern   Not on file  Social History Narrative   Not on file   Social Determinants of Health   Financial Resource Strain: Not on file  Food Insecurity: Not on file  Transportation Needs: Not on file  Physical Activity: Not on file  Stress: Not on file  Social Connections: Not on file  Intimate Partner Violence: Not on file     Review of Systems: All other systems reviewed and are otherwise negative except as noted above.  Physical Exam: Vitals:   01/10/22 1212  BP: 120/74  Pulse: 64  SpO2: 97%  Weight: 172 lb 12.8 oz (78.4 kg)  Height: 5\' 8"  (1.727 m)    GEN- The patient is well appearing, alert and oriented x 3 today.   HEENT: normocephalic, atraumatic; sclera clear, conjunctiva pink; hearing intact; oropharynx clear; neck supple, no JVP Lymph- no cervical lymphadenopathy Lungs- Clear to ausculation bilaterally, normal work of breathing.  No wheezes, rales, rhonchi Heart- Regular rate and rhythm, no murmurs, rubs or gallops,  PMI not laterally displaced GI- soft, non-tender, non-distended, bowel sounds present, no hepatosplenomegaly Extremities- no clubbing, cyanosis, or edema; DP/PT/radial pulses 2+ bilaterally MS- no significant deformity or atrophy Skin- warm and dry, no rash or lesion Psych- euthymic mood, full affect Neuro- strength and sensation are intact  EKG is not ordered. Personal review of EKG from 10/2021 shows NSR at 72 with stable QTc 466 ms by auto-calculate.  Significant "static" on scanned EKG. Attempted to repeat and pt refused.  Additional studies reviewed include: Previous EP office notes.   Assessment and Plan:  1. Paroxysmal atrial  fibrillation EKG 10/2021 shows NSR and stable QTc on [employer] today Stressed importance of monitoring closely on Tikosyn. By his Cr. Cl 04/2021 (most recent available) he was borderline for dose reduction with a CrCl of 62.08 (Threshold for dose reduction is <60.) If he continues to refuse routine follow up and work up; may need to consider changing him to amiodarone, especially if he is unable to afford Tikosyn.   2. HTN Stable on current regimen   3. HLD Per PCP  Follow up with Dr. Ladona Ridgel in 6 months   Graciella Freer, PA-C  01/10/22 12:32 PM

## 2022-01-10 ENCOUNTER — Ambulatory Visit (INDEPENDENT_AMBULATORY_CARE_PROVIDER_SITE_OTHER): Payer: Medicare Other | Admitting: Student

## 2022-01-10 ENCOUNTER — Encounter: Payer: Self-pay | Admitting: Student

## 2022-01-10 ENCOUNTER — Other Ambulatory Visit: Payer: Self-pay

## 2022-01-10 VITALS — BP 120/74 | HR 64 | Ht 68.0 in | Wt 172.8 lb

## 2022-01-10 DIAGNOSIS — I10 Essential (primary) hypertension: Secondary | ICD-10-CM | POA: Diagnosis not present

## 2022-01-10 DIAGNOSIS — I4819 Other persistent atrial fibrillation: Secondary | ICD-10-CM

## 2022-01-10 LAB — BASIC METABOLIC PANEL
BUN/Creatinine Ratio: 27 — ABNORMAL HIGH (ref 10–24)
BUN: 22 mg/dL (ref 8–27)
CO2: 24 mmol/L (ref 20–29)
Calcium: 9.3 mg/dL (ref 8.6–10.2)
Chloride: 104 mmol/L (ref 96–106)
Creatinine, Ser: 0.81 mg/dL (ref 0.76–1.27)
Glucose: 106 mg/dL — ABNORMAL HIGH (ref 70–99)
Potassium: 4.5 mmol/L (ref 3.5–5.2)
Sodium: 139 mmol/L (ref 134–144)
eGFR: 90 mL/min/{1.73_m2} (ref >59)

## 2022-01-10 LAB — MAGNESIUM: Magnesium: 2.1 mg/dL (ref 1.6–2.3)

## 2022-01-10 MED ORDER — DOFETILIDE 500 MCG PO CAPS: 500.0000 ug | ORAL_CAPSULE | Freq: Two times a day (BID) | ORAL | 6 refills | Status: DC

## 2022-01-10 NOTE — Patient Instructions (Signed)
Medication Instructions:  Your physician recommends that you continue on your current medications as directed. Please refer to the Current Medication list given to you today.  *If you need a refill on your cardiac medications before your next appointment, please call your pharmacy*   Lab Work: TODAY: BMET, Regulatory affairs officer.  If you have labs (blood work) drawn today and your tests are completely normal, you will receive your results only by: MyChart Message (if you have MyChart) OR A paper copy in the mail If you have any lab test that is abnormal or we need to change your treatment, we will call you to review the results.   Follow-Up: At [facility], you and your health needs are our priority.  As part of our continuing mission to provide you with exceptional heart care, we have created designated Provider Care Teams.  These Care Teams include your primary Cardiologist (physician) and Advanced Practice Providers (APPs -  Physician Assistants and Nurse Practitioners) who all work together to provide you with the care you need, when you need it.  We recommend signing up for the patient portal called "MyChart".  Sign up information is provided on this After Visit Summary.  MyChart is used to connect with patients for Virtual Visits (Telemedicine).  Patients are able to view lab/test results, encounter notes, upcoming appointments, etc.  Non-urgent messages can be sent to your provider as well.   To learn more about what you can do with MyChart, go to [email or web address].    Your next appointment:   6 month(s)  The format for your next appointment:   In Person  Provider:   Lewayne Bunting, MD

## 2022-01-23 ENCOUNTER — Telehealth: Payer: Self-pay | Admitting: Student

## 2022-01-23 NOTE — Telephone Encounter (Signed)
? ?  Pt c/o medication issue: ? ?1. Name of Medication: dofetilide (TIKOSYN) 500 MCG capsule ? ?2. How are you currently taking this medication (dosage and times per day)? Take 1 capsule (500 mcg total) by mouth 2 (two) times daily. ? ?3. Are you having a reaction (difficulty breathing--STAT)?  ? ?4. What is your medication issue? [facility] calling, she said, they dont have 500 mcg of tikosyn. They have 250 mcg available and they just wanted to know if that ok and need to get new prescription for it ? ?

## 2022-01-24 ENCOUNTER — Telehealth: Payer: Self-pay | Admitting: Internal Medicine

## 2022-01-24 NOTE — Telephone Encounter (Signed)
Pt c/o medication issue: ? ?1. Name of Medication: dofetilide (TIKOSYN) 500 MCG capsule ? ?2. How are you currently taking this medication (dosage and times per day)?  ?Take 1 capsule (500 mcg total) by mouth 2 (two) times daily. - Oral ? ? ?3. Are you having a reaction (difficulty breathing--STAT)? no ? ?4. What is your medication issue? Pharmacy is able to fill for  250 mcg with frequency change to equate for total amount needed daily.. please advise ? ?

## 2022-01-24 NOTE — Telephone Encounter (Signed)
No, this is not appropriate. Please see message from [facility] yesterday,  Patient will need to use a different pharmacy. ?

## 2022-01-25 ENCOUNTER — Other Ambulatory Visit (HOSPITAL_COMMUNITY): Payer: Self-pay

## 2022-01-25 MED ORDER — DOFETILIDE 500 MCG PO CAPS
500.0000 ug | ORAL_CAPSULE | Freq: Two times a day (BID) | ORAL | 6 refills | Status: DC
Start: 2022-01-25 — End: 2024-01-22
  Filled 2022-01-25 (×2): qty 60, 30d supply, fill #0
  Filled 2022-02-24: qty 60, 30d supply, fill #1

## 2022-01-25 NOTE — Telephone Encounter (Signed)
Issue addressed in previous phone note.  ?

## 2022-01-25 NOTE — Telephone Encounter (Signed)
Called [facility] and they have a supply of the 500 mcg capsules of  tikosyn. Rx sent in. Called and made patient aware and he states that he will pick up today. Called and made [facility] aware.  ?

## 2022-02-24 ENCOUNTER — Other Ambulatory Visit (HOSPITAL_COMMUNITY): Payer: Self-pay

## 2022-02-28 NOTE — Progress Notes (Signed)
?Office Note  ? ? ? ?CC:  follow up ?Requesting Provider:  Karle Plumber, MD ? ?HPI: Kevin Gonzales is a 81 y.o. (September 07, 1941) male who presents for routine follow up of carotid artery disease. He is s/p left CEA on 05/03/21 by Dr. Arbie Cookey due to symptomatic carotid stenosis causing amaurosis fugax.  He also had right carotid endarterectomy by Dr. Arbie Cookey in 2019 due to high-grade asymptomatic stenosis. At time of his last visit in July of 2022 he was doing well post operatively. He had resolution of visual changes and no new neurological symptoms. ? ?He presents today for 9 month follow up with carotid duplex. He denies any amaurosis fugax, slurred speech, facial drooping, unilateral upper or lower extremity weakness of numbness. He says he very rarely will get a spot in his left eye. He goes to Ophthalmologist every 6 months. Anticipates he will need cataract surgery soon. He denies any claudication, rest pain or tissue loss. He is having sciatic nerve issues. He ambulates with a cane.  ? ?The pt is on a statin for cholesterol management.  ?The pt is on a daily aspirin.   Other AC:  Eliquis ?The pt is on ARB for hypertension.   ?The pt is not diabetic.   ?Tobacco hx:  Former ? ?Past Medical History:  ?Diagnosis Date  ? Abdominal obesity   ? Alcohol abuse   ? Arthritis   ? Atrial fibrillation (HCC)   ? Carotid artery occlusion   ? Carotid bruit   ? Chest pain   ? Chondrodermatitis nodularis helicis, left   ? Coronary artery disease   ? Dyspnea   ? GERD (gastroesophageal reflux disease)   ? HOH (hard of hearing)   ? Hyperlipidemia   ? Hypertension   ? Myocardial infarction [facility]) 1991  ? Thyroid disease   ? Vitamin D deficiency   ? ? ?Past Surgical History:  ?Procedure Laterality Date  ? CARDIOVERSION N/A 07/25/2018  ? Procedure: CARDIOVERSION;  Surgeon: Thurmon Fair, MD;  Location: [facility] ENDOSCOPY;  Service: Cardiovascular;  Laterality: N/A;  ? CAROTID ENDARTERECTOMY Right 03/29/2018  ? CAROTID STENT  2004  ? CORONARY  ARTERY BYPASS GRAFT    ? 1 vessel  ? ENDARTERECTOMY Right 03/29/2018  ? Procedure: Right Carotid Endartarectomy;  Surgeon: Larina Earthly, MD;  Location: [facility] OR;  Service: Vascular;  Laterality: Right;  ? ENDARTERECTOMY Left 05/03/2021  ? Procedure: LEFT CAROTID ENDARTERECTOMY WITH PATCH ANGIOPLASTY;  Surgeon: Larina Earthly, MD;  Location: [facility] OR;  Service: Vascular;  Laterality: Left;  ? LEFT HEART CATH AND CORS/GRAFTS ANGIOGRAPHY N/A 05/10/2017  ? Procedure: Left Heart Cath and Cors/Grafts Angiography;  Surgeon: Runell Gess, MD;  Location: [facility] INVASIVE CV LAB;  Service: Cardiovascular;  Laterality: N/A;  ? PATCH ANGIOPLASTY Right 03/29/2018  ? Procedure: PATCH ANGIOPLASTY;  Surgeon: Larina Earthly, MD;  Location: [facility] OR;  Service: Vascular;  Laterality: Right;  ? ? ?Social History  ? ?Socioeconomic History  ? Marital status: Married  ?  Spouse name: Not on file  ? Number of children: Not on file  ? Years of education: Not on file  ? Highest education level: Not on file  ?Occupational History  ? Not on file  ?Tobacco Use  ? Smoking status: Former  ?  Years: 32.00  ?  Types: Cigarettes  ?  Quit date: 66  ?  Years since quitting: 32.3  ?  Passive exposure: Never  ? Smokeless tobacco: Never  ?Vaping Use  ?  Vaping Use: Never used  ?Substance and Sexual Activity  ? Alcohol use: Not Currently  ? Drug use: No  ? Sexual activity: Not Currently  ?Other Topics Concern  ? Not on file  ?Social History Narrative  ? Not on file  ? ?Social Determinants of Health  ? ?Financial Resource Strain: Not on file  ?Food Insecurity: Not on file  ?Transportation Needs: Not on file  ?Physical Activity: Not on file  ?Stress: Not on file  ?Social Connections: Not on file  ?Intimate Partner Violence: Not on file  ? ? ?Family History  ?Problem Relation Age of Onset  ? Clotting disorder Father   ? Clotting disorder Sister   ? ? ?Current Outpatient Medications  ?Medication Sig Dispense Refill  ? apixaban (ELIQUIS) 5 MG TABS tablet Take 1 tablet (5  mg total) by mouth 2 (two) times daily. 180 tablet 3  ? aspirin EC 81 MG tablet Take 81 mg by mouth daily. Swallow whole.    ? beclomethasone (QVAR) 80 MCG/ACT inhaler Inhale 1 puff into the lungs 2 (two) times daily as needed (for shortness of breath).     ? bismuth subsalicylate (PEPTO BISMOL) 262 MG/15ML suspension Take 30 mLs by mouth every 6 (six) hours as needed for indigestion.    ? calcium carbonate (TUMS - DOSED IN MG ELEMENTAL CALCIUM) 500 MG chewable tablet Chew 1,000 mg by mouth daily as needed for indigestion or heartburn.    ? dofetilide (TIKOSYN) 500 MCG capsule Take 1 capsule (500 mcg total) by mouth 2 (two) times daily. 60 capsule 6  ? finasteride (PROSCAR) 5 MG tablet Take 5 mg by mouth daily.    ? gabapentin (NEURONTIN) 300 MG capsule Take 300 mg by mouth daily.    ? HYDROcodone-acetaminophen (NORCO) 7.5-325 MG tablet Take 1 tablet by mouth every 6 (six) hours as needed (for pain.). 15 tablet 0  ? ibuprofen (ADVIL,MOTRIN) 200 MG tablet Take 200-400 mg by mouth daily as needed for moderate pain.    ? losartan (COZAAR) 100 MG tablet Take 100 mg by mouth daily.    ? meclizine (ANTIVERT) 25 MG tablet Take 25 mg by mouth 3 (three) times daily as needed for dizziness.    ? nitroGLYCERIN (NITROSTAT) 0.4 MG SL tablet Place 0.4 mg under the tongue every 5 (five) minutes as needed for chest pain.    ? Omega-3 Fatty Acids (FISH OIL ULTRA) 1400 MG CAPS Take 1,400 mg by mouth daily.    ? omeprazole (PRILOSEC) 20 MG capsule Take 20 mg by mouth daily before breakfast.     ? potassium chloride SA (KLOR-CON) 20 MEQ tablet Take 20 mEq by mouth daily.    ? Vitamin D, Ergocalciferol, (DRISDOL) 50000 units CAPS capsule Take 50,000 Units by mouth every Sunday.     ? pravastatin (PRAVACHOL) 40 MG tablet Take 40 mg by mouth daily.    ? ?No current facility-administered medications for this visit.  ? ? ?Allergies  ?Allergen Reactions  ? Perflutren Lipid Microsphere Other (See Comments)  ?  Severe lower back, tailbone, and  butt pain. Minor chest pain  ? ? ? ?REVIEW OF SYSTEMS:  ?[X]  denotes positive finding, [ ]  denotes negative finding ?Cardiac  Comments:  ?Chest pain or chest pressure:    ?Shortness of breath upon exertion:    ?Short of breath when lying flat:    ?Irregular heart rhythm:    ?    ?Vascular    ?Pain in calf, thigh, or hip brought on  by ambulation:    ?Pain in feet at night that wakes you up from your sleep:     ?Blood clot in your veins:    ?Leg swelling:     ?    ?Pulmonary    ?Oxygen at home:    ?Productive cough:     ?Wheezing:     ?    ?Neurologic    ?Sudden weakness in arms or legs:     ?Sudden numbness in arms or legs:     ?Sudden onset of difficulty speaking or slurred speech:    ?Temporary loss of vision in one eye:     ?Problems with dizziness:     ?    ?Gastrointestinal    ?Blood in stool:     ?Vomited blood:     ?    ?Genitourinary    ?Burning when urinating:     ?Blood in urine:    ?    ?Psychiatric    ?Major depression:     ?    ?Hematologic    ?Bleeding problems:    ?Problems with blood clotting too easily:    ?    ?Skin    ?Rashes or ulcers:    ?    ?Constitutional    ?Fever or chills:    ? ? ?PHYSICAL EXAMINATION: ? ?Vitals:  ? 03/02/22 1256 03/02/22 1300  ?BP: 103/60 121/66  ?Pulse: (!) 54   ?Resp: 20   ?Temp: 97.6 ?F (36.4 ?C)   ?TempSrc: Temporal   ?SpO2: 98%   ?Weight: 180 lb 6.4 oz (81.8 kg)   ?Height: 5\' 8"  (1.727 m)   ? ? ?General:  WDWN in NAD; vital signs documented above ?Gait: Normal, ambulates with cane ?HENT: WNL, normocephalic ?Pulmonary: normal non-labored breathing , without wheezing ?Cardiac: regular HR, without  Murmurs without carotid bruit ?Abdomen: obese, soft, NT, no masses ?Vascular Exam/Pulses:2+ radial pulses, 2+ DP and PT pulses bilaterally.  ?Extremities: without ischemic changes, without Gangrene , without cellulitis; without open wounds;  ?Musculoskeletal: no muscle wasting or atrophy  ?Neurologic: A&O X 3;  No focal weakness or paresthesias are detected ?Psychiatric:  The  pt has Normal affect. ? ? ?Non-Invasive Vascular Imaging:   ?Summary:  ?Right Carotid: Velocities in the right ICA are consistent with a 1-39% stenosis.  ? ?Left Carotid: Patent carotid endarterectom

## 2022-03-02 ENCOUNTER — Ambulatory Visit (INDEPENDENT_AMBULATORY_CARE_PROVIDER_SITE_OTHER): Payer: Medicare Other | Admitting: Physician Assistant

## 2022-03-02 ENCOUNTER — Ambulatory Visit (HOSPITAL_COMMUNITY)
Admission: RE | Admit: 2022-03-02 | Discharge: 2022-03-02 | Disposition: A | Discharge status: Home / Self Care | Payer: Medicare Other | Source: Ambulatory Visit | Attending: Physician Assistant | Admitting: Physician Assistant

## 2022-03-02 ENCOUNTER — Encounter: Payer: Self-pay | Admitting: Physician Assistant

## 2022-03-02 VITALS — BP 121/66 | HR 54 | Temp 97.6°F | Resp 20 | Ht 68.0 in | Wt 180.4 lb

## 2022-03-02 DIAGNOSIS — Z9889 Other specified postprocedural states: Secondary | ICD-10-CM

## 2022-03-02 DIAGNOSIS — I6523 Occlusion and stenosis of bilateral carotid arteries: Secondary | ICD-10-CM

## 2022-03-22 ENCOUNTER — Telehealth: Payer: Self-pay | Admitting: Internal Medicine

## 2022-03-22 NOTE — Telephone Encounter (Signed)
? ?  Pre-operative Risk Assessment  ?  ?Patient Name: Kevin Gonzales  ?DOB: January 18, 1941 ?MRN: [id number]  ? ?  ? ?Request for Surgical Clearance   ? ?Procedure:   Transforaminal epidural steroid injection ? ?Date of Surgery:  Clearance 04/19/22                              ?   ?Surgeon:  Dr. Janae Sauce ?Surgeon's Group or Practice Name:  [facility] in [place] ?Phone number:  [phone] ?Fax number:  [phone] ?  ?Type of Clearance Requested:   ?- Pharmacy:  Hold Apixaban (Eliquis) 3 days prior to procedure ?  ?Type of Anesthesia:  None  ?  ?Additional requests/questions:   ? ?Signed, ?Brooks Sailors   ?03/22/2022, 1:20 PM   ?

## 2022-03-22 NOTE — Telephone Encounter (Signed)
? ?  Name: Kevin Gonzales  ?DOB: 12-Jan-1941  ?MRN: [id number]  ? ?Primary Cardiologist: Lewayne Bunting, MD ? ?Chart reviewed as part of pre-operative protocol coverage. Patient was contacted 03/22/2022 in reference to pre-operative risk assessment for pending surgery as outlined below.  Kevin Gonzales was last seen on 01/10/2022 by Alejandro Mulling PA-C.  Since that day, Kevin Gonzales has done well without exertional chest pain or worsening shortness of breath. ? ?Therefore, based on ACC/AHA guidelines, the patient would be at acceptable risk for the planned procedure without further cardiovascular testing.  ? ?Patient may hold Eliquis for 3 days prior to the procedure and restart as soon as possible afterward at the surgeon's discretion. ? ?The patient was advised that if he develops new symptoms prior to surgery to contact our office to arrange for a follow-up visit, and he verbalized understanding. ? ?I will route this recommendation to the requesting party via Epic fax function and remove from pre-op pool. Please call with questions. ? ?Azalee Course, [place] ?03/22/2022, 6:18 PM ? ?

## 2022-03-22 NOTE — Telephone Encounter (Signed)
Clinical pharmacist to review Eliquis 

## 2022-03-22 NOTE — Telephone Encounter (Signed)
Patient with diagnosis of afib on Eliquis for anticoagulation.   ? ?Procedure: ESI ?Date of procedure: 04/19/22 ? ?CHA2DS2-VASc Score = 4  ?This indicates a 4.8% annual risk of stroke. ?The patient's score is based upon: ?CHF History: 0 ?HTN History: 1 ?Diabetes History: 0 ?Stroke History: 0 ?Vascular Disease History: 1 ?Age Score: 2 ?Gender Score: 0 ?  ?CrCl 3mL/min ?Platelet count 174K ? ?Per office protocol, patient can hold Eliquis for 3 days prior to procedure.   ?

## 2022-08-02 ENCOUNTER — Ambulatory Visit: Payer: Medicare Other | Attending: Cardiovascular Disease | Admitting: Cardiovascular Disease

## 2022-08-02 ENCOUNTER — Encounter: Payer: Self-pay | Admitting: Cardiovascular Disease

## 2022-08-02 VITALS — BP 114/52 | HR 64 | Ht 68.0 in | Wt 185.0 lb

## 2022-08-02 DIAGNOSIS — E7800 Pure hypercholesterolemia, unspecified: Secondary | ICD-10-CM | POA: Diagnosis not present

## 2022-08-02 DIAGNOSIS — I480 Paroxysmal atrial fibrillation: Secondary | ICD-10-CM | POA: Diagnosis present

## 2022-08-02 DIAGNOSIS — I6523 Occlusion and stenosis of bilateral carotid arteries: Secondary | ICD-10-CM | POA: Diagnosis not present

## 2022-08-02 DIAGNOSIS — I10 Essential (primary) hypertension: Secondary | ICD-10-CM | POA: Diagnosis not present

## 2022-08-02 DIAGNOSIS — I2510 Atherosclerotic heart disease of native coronary artery without angina pectoris: Secondary | ICD-10-CM | POA: Insufficient documentation

## 2022-08-02 MED ORDER — PRAVASTATIN SODIUM 80 MG PO TABS: 80.0000 mg | ORAL_TABLET | Freq: Every day | ORAL | Status: DC

## 2022-08-02 NOTE — Assessment & Plan Note (Signed)
History of essential hypertension a blood pressure measured today at 114/52.  He is on losartan.

## 2022-08-02 NOTE — Assessment & Plan Note (Signed)
History of hyperlipidemia on statin therapy with lipid profile performed by his PCP 11/30/2021 revealing total cholesterol 137, LDL 70 and HDL 44.

## 2022-08-02 NOTE — Assessment & Plan Note (Signed)
History of carotid artery disease status post elective right carotid endarterectomy performed by Dr. Sherren Mocha Early 03/29/2018 which she follows annually by duplex ultrasound.

## 2022-08-02 NOTE — Assessment & Plan Note (Signed)
History of paroxysmal atrial fibrillation maintaining sinus rhythm on Tikosyn and Eliquis.

## 2022-08-02 NOTE — Progress Notes (Signed)
08/02/2022 Kevin Gonzales   21-Jul-1941  [id number]  Primary Physician Karle Plumber, MD Primary Cardiologist: Runell Gess MD Kevin Gonzales, [place]  HPI:  Kevin Gonzales is a 81 y.o.  mildly overweight married Caucasian male father of 1 , grandfather one grandchild referred by Arnette Felts for cardiovascular evaluation and coronary angiography because of ongoing chest pain.  I last saw him in the office 01/07/2019.  He has a history of treated hypertension, hyperlipidemia and remote tobacco abuse having quit in 1991. He had coronary artery bypass grafting times one in 1991 with a RIMA to his RCA by Dr. Arvilla Market at [facility]. I placed a 2.75 mm x 16 mm long Taxus drug-eluting stent in his proximal LAD 02/20/04.  I performed cardiac catheterization on him 05/10/2017 revealing normal LV function, a patent RIMA to the RCA with with an occluded native RCA, patent stent to the proximal LAD which I placed in 2005 and high-grade ostial nondominant circumflex.  Medical therapy was recommended.  He recently had elective right carotid endarterectomy performed by Dr. Tawanna Cooler Early 03/29/2018 and had episodes of PAF in the perioperative period.  He was seen by Dr. Jens Som who began him on Eliquis oral anticoagulation.   He was admitted to the hospital 08/12/2018 for "Tikosyn load" and converted to sinus rhythm which he has maintained on Eliquis oral anticoagulation.  He has maintained sinus rhythm.  Since I saw him 3-1/2 years ago he is remained stable.  He denies chest pain or shortness of breath.  Unfortunately his wife of 60 years passed away from cancer 2021-11-12.  He lives alone and admits to being significantly depressed.  Current Meds  Medication Sig   apixaban (ELIQUIS) 5 MG TABS tablet Take 1 tablet (5 mg total) by mouth 2 (two) times daily.   aspirin EC 81 MG tablet Take 81 mg by mouth daily. Swallow whole.   beclomethasone (QVAR) 80 MCG/ACT inhaler Inhale 1 puff into  the lungs 2 (two) times daily as needed (for shortness of breath).    bismuth subsalicylate (PEPTO BISMOL) 262 MG/15ML suspension Take 30 mLs by mouth every 6 (six) hours as needed for indigestion.   calcium carbonate (TUMS - DOSED IN MG ELEMENTAL CALCIUM) 500 MG chewable tablet Chew 1,000 mg by mouth daily as needed for indigestion or heartburn.   dofetilide (TIKOSYN) 500 MCG capsule Take 1 capsule (500 mcg total) by mouth 2 (two) times daily.   finasteride (PROSCAR) 5 MG tablet Take 5 mg by mouth daily.   gabapentin (NEURONTIN) 300 MG capsule Take 300 mg by mouth daily.   HYDROcodone-acetaminophen (NORCO) 7.5-325 MG tablet Take 1 tablet by mouth every 6 (six) hours as needed (for pain.).   ibuprofen (ADVIL,MOTRIN) 200 MG tablet Take 200-400 mg by mouth daily as needed for moderate pain.   losartan (COZAAR) 100 MG tablet Take 100 mg by mouth daily.   meclizine (ANTIVERT) 25 MG tablet Take 25 mg by mouth 3 (three) times daily as needed for dizziness.   nitroGLYCERIN (NITROSTAT) 0.4 MG SL tablet Place 0.4 mg under the tongue every 5 (five) minutes as needed for chest pain.   Omega-3 Fatty Acids (FISH OIL ULTRA) 1400 MG CAPS Take 1,400 mg by mouth daily.   omeprazole (PRILOSEC) 20 MG capsule Take 20 mg by mouth daily before breakfast.    potassium chloride SA (KLOR-CON) 20 MEQ tablet Take 20 mEq by mouth daily.   pravastatin (PRAVACHOL) 40 MG tablet  Take 40 mg by mouth daily.   Vitamin D, Ergocalciferol, (DRISDOL) 50000 units CAPS capsule Take 50,000 Units by mouth every Sunday.      Allergies  Allergen Reactions   Perflutren Lipid Microsphere Other (See Comments)    Severe lower back, tailbone, and butt pain. Minor chest pain    Social History   Socioeconomic History   Marital status: Married    Spouse name: Not on file   Number of children: Not on file   Years of education: Not on file   Highest education level: Not on file  Occupational History   Not on file  Tobacco Use   Smoking  status: Former    Years: 32.00    Types: Cigarettes    Quit date: 77    Years since quitting: 32.7    Passive exposure: Never   Smokeless tobacco: Never  Vaping Use   Vaping Use: Never used  Substance and Sexual Activity   Alcohol use: Not Currently   Drug use: No   Sexual activity: Not Currently  Other Topics Concern   Not on file  Social History Narrative   Not on file   Social Determinants of Health   Financial Resource Strain: Not on file  Food Insecurity: Not on file  Transportation Needs: Not on file  Physical Activity: Not on file  Stress: Not on file  Social Connections: Not on file  Intimate Partner Violence: Not on file     Review of Systems: General: negative for chills, fever, night sweats or weight changes.  Cardiovascular: negative for chest pain, dyspnea on exertion, edema, orthopnea, palpitations, paroxysmal nocturnal dyspnea or shortness of breath Dermatological: negative for rash Respiratory: negative for cough or wheezing Urologic: negative for hematuria Abdominal: negative for nausea, vomiting, diarrhea, bright red blood per rectum, melena, or hematemesis Neurologic: negative for visual changes, syncope, or dizziness All other systems reviewed and are otherwise negative except as noted above.    Blood pressure (!) 114/52, pulse 64, height 5\' 8"  (1.727 m), weight 185 lb (83.9 kg).  General appearance: alert and no distress Neck: no adenopathy, no carotid bruit, no JVD, supple, symmetrical, trachea midline, and thyroid not enlarged, symmetric, no tenderness/mass/nodules Lungs: clear to auscultation bilaterally Heart: regular rate and rhythm, S1, S2 normal, no murmur, click, rub or gallop Extremities: extremities normal, atraumatic, no cyanosis or edema Pulses: 2+ and symmetric Skin: Skin color, texture, turgor normal. No rashes or lesions Neurologic: Grossly normal  EKG sinus rhythm at 64 without ST or T wave changes.  Personally reviewed this  EKG.  ASSESSMENT AND PLAN:   Coronary artery disease History of CAD status post bypass grafting x1 in 1991 with a RIMA to the his RCA by Dr. Jerelene Redden at [facility].  I placed a 2.75 mm x 16 mm long Taxus drug-eluting stent in his proximal LAD/9/05.  His last catheterization which I performed 05/10/2017 revealed an occluded native RCA with a patent RIMA to the RCA, patent stent to the proximal LAD and high-grade ostial nondominant circumflex which I decided to treat medically.  He is completely asymptomatic.  Hypercholesterolemia History of hyperlipidemia on statin therapy with lipid profile performed by his PCP 11/30/2021 revealing total cholesterol 137, LDL 70 and HDL 44.  Essential hypertension History of essential hypertension a blood pressure measured today at 114/52.  He is on losartan.  Carotid artery stenosis History of carotid artery disease status post elective right carotid endarterectomy performed by Dr. Sherren Mocha Early 03/29/2018 which  she follows annually by duplex ultrasound.  Paroxysmal atrial fibrillation (HCC) History of paroxysmal atrial fibrillation maintaining sinus rhythm on Tikosyn and Eliquis.     Runell Gess MD FACP,FACC,FAHA, [facility] 08/02/2022 1:19 PM

## 2022-08-02 NOTE — Patient Instructions (Signed)
Medication Instructions:  Your physician recommends that you continue on your current medications as directed. Please refer to the Current Medication list given to you today.  *If you need a refill on your cardiac medications before your next appointment, please call your pharmacy*   Follow-Up: At [facility], you and your health needs are our priority.  As part of our continuing mission to provide you with exceptional heart care, we have created designated Provider Care Teams.  These Care Teams include your primary Cardiologist (physician) and Advanced Practice Providers (APPs -  Physician Assistants and Nurse Practitioners) who all work together to provide you with the care you need, when you need it.  We recommend signing up for the patient portal called "MyChart".  Sign up information is provided on this After Visit Summary.  MyChart is used to connect with patients for Virtual Visits (Telemedicine).  Patients are able to view lab/test results, encounter notes, upcoming appointments, etc.  Non-urgent messages can be sent to your provider as well.   To learn more about what you can do with MyChart, go to [email or web address].    Your next appointment:   12 month(s)  The format for your next appointment:   In Person  Provider:   Jonathan Berry, MD   

## 2022-08-02 NOTE — Assessment & Plan Note (Signed)
History of CAD status post bypass grafting x1 in 1991 with a RIMA to the his RCA by Dr. Jerelene Redden at [facility].  I placed a 2.75 mm x 16 mm long Taxus drug-eluting stent in his proximal LAD/9/05.  His last catheterization which I performed 05/10/2017 revealed an occluded native RCA with a patent RIMA to the RCA, patent stent to the proximal LAD and high-grade ostial nondominant circumflex which I decided to treat medically.  He is completely asymptomatic.

## 2022-12-29 ENCOUNTER — Encounter: Payer: Self-pay | Admitting: Internal Medicine

## 2022-12-29 ENCOUNTER — Ambulatory Visit: Payer: Medicare HMO | Attending: Internal Medicine | Admitting: Internal Medicine

## 2022-12-29 VITALS — BP 132/64 | HR 61 | Ht 68.0 in | Wt 186.8 lb

## 2022-12-29 DIAGNOSIS — I480 Paroxysmal atrial fibrillation: Secondary | ICD-10-CM

## 2022-12-29 NOTE — Progress Notes (Signed)
HPI Kevin Gonzales returns today for followup of persistent atrial fib. He has been on dofetilide and has not had any symptomatic palpitations or documented atrial fib. He denies chest pain or sob. No syncope. He has not had any bleeding. He notes some weight loss. His appetite is stable. No palpitations. He had atrial fib when he underwent vascular surgery. He notes that he lost his wife last year. Allergies  Allergen Reactions   Perflutren Lipid Microsphere Other (See Comments)    Severe lower back, tailbone, and butt pain. Minor chest pain     Current Outpatient Medications  Medication Sig Dispense Refill   apixaban (ELIQUIS) 5 MG TABS tablet Take 1 tablet (5 mg total) by mouth 2 (two) times daily. 180 tablet 3   aspirin EC 81 MG tablet Take 81 mg by mouth daily. Swallow whole.     beclomethasone (QVAR) 80 MCG/ACT inhaler Inhale 1 puff into the lungs 2 (two) times daily as needed (for shortness of breath).      bismuth subsalicylate (PEPTO BISMOL) 262 MG/15ML suspension Take 30 mLs by mouth every 6 (six) hours as needed for indigestion.     dofetilide (TIKOSYN) 500 MCG capsule Take 1 capsule (500 mcg total) by mouth 2 (two) times daily. 60 capsule 6   finasteride (PROSCAR) 5 MG tablet Take 5 mg by mouth daily.     gabapentin (NEURONTIN) 300 MG capsule Take 300 mg by mouth daily.     HYDROcodone-acetaminophen (NORCO) 7.5-325 MG tablet Take 1 tablet by mouth every 6 (six) hours as needed (for pain.). 15 tablet 0   losartan (COZAAR) 100 MG tablet Take 100 mg by mouth daily.     meclizine (ANTIVERT) 25 MG tablet Take 25 mg by mouth 3 (three) times daily as needed for dizziness.     nitroGLYCERIN (NITROSTAT) 0.4 MG SL tablet Place 0.4 mg under the tongue every 5 (five) minutes as needed for chest pain.     Omega-3 Fatty Acids (FISH OIL ULTRA) 1400 MG CAPS Take 1,400 mg by mouth daily.     omeprazole (PRILOSEC) 20 MG capsule Take 20 mg by mouth daily before breakfast.      potassium  chloride SA (KLOR-CON) 20 MEQ tablet Take 20 mEq by mouth daily.     pravastatin (PRAVACHOL) 80 MG tablet Take 1 tablet (80 mg total) by mouth daily.     Vitamin D, Ergocalciferol, (DRISDOL) 50000 units CAPS capsule Take 50,000 Units by mouth every Sunday.      No current facility-administered medications for this visit.     Past Medical History:  Diagnosis Date   Abdominal obesity    Alcohol abuse    Arthritis    Atrial fibrillation (HCC)    Carotid artery occlusion    Carotid bruit    Chest pain    Chondrodermatitis nodularis helicis, left    Coronary artery disease    Dyspnea    GERD (gastroesophageal reflux disease)    HOH (hard of hearing)    Hyperlipidemia    Hypertension    Myocardial infarction ([place]) 1991   Thyroid disease    Vitamin D deficiency     ROS:   All systems reviewed and negative except as noted in the HPI.   Past Surgical History:  Procedure Laterality Date   CARDIOVERSION N/A 07/25/2018   Procedure: CARDIOVERSION;  Surgeon: Sanda Klein, MD;  Location: [facility];  Service: Cardiovascular;  Laterality: N/A;   CAROTID ENDARTERECTOMY Right 03/29/2018  CAROTID STENT  2004   CORONARY ARTERY BYPASS GRAFT     1 vessel   ENDARTERECTOMY Right 03/29/2018   Procedure: Right Carotid Endartarectomy;  Surgeon: Rosetta Posner, MD;  Location: [facility];  Service: Vascular;  Laterality: Right;   ENDARTERECTOMY Left 05/03/2021   Procedure: LEFT CAROTID ENDARTERECTOMY WITH PATCH ANGIOPLASTY;  Surgeon: Rosetta Posner, MD;  Location: [facility];  Service: Vascular;  Laterality: Left;   LEFT HEART CATH AND CORS/GRAFTS ANGIOGRAPHY N/A 05/10/2017   Procedure: Left Heart Cath and Cors/Grafts Angiography;  Surgeon: Lorretta Harp, MD;  Location: [facility];  Service: Cardiovascular;  Laterality: N/A;   PATCH ANGIOPLASTY Right 03/29/2018   Procedure: PATCH ANGIOPLASTY;  Surgeon: Rosetta Posner, MD;  Location: [facility];  Service: Vascular;  Laterality: Right;     Family  History  Problem Relation Age of Onset   Clotting disorder Father    Clotting disorder Sister      Social History   Socioeconomic History   Marital status: Married    Spouse name: Not on file   Number of children: Not on file   Years of education: Not on file   Highest education level: Not on file  Occupational History   Not on file  Tobacco Use   Smoking status: Former    Years: 32.00    Types: Cigarettes    Quit date: 56    Years since quitting: 33.1    Passive exposure: Never   Smokeless tobacco: Never  Vaping Use   Vaping Use: Never used  Substance and Sexual Activity   Alcohol use: Not Currently   Drug use: No   Sexual activity: Not Currently  Other Topics Concern   Not on file  Social History Narrative   Not on file   Social Determinants of Health   Financial Resource Strain: Not on file  Food Insecurity: Not on file  Transportation Needs: Not on file  Physical Activity: Not on file  Stress: Not on file  Social Connections: Not on file  Intimate Partner Violence: Not on file     BP 132/64   Pulse 61   Ht 5' 8"$  (1.727 m)   Wt 186 lb 12.8 oz (84.7 kg)   SpO2 95%   BMI 28.40 kg/m   Physical Exam:  Well appearing NAD HEENT: Unremarkable Neck:  No JVD, no thyromegally Lymphatics:  No adenopathy Back:  No CVA tenderness Lungs:  Clear with no wheezes HEART:  Regular rate rhythm, no murmurs, no rubs, no clicks Abd:  soft, positive bowel sounds, no organomegally, no rebound, no guarding Ext:  2 plus pulses, no edema, no cyanosis, no clubbing Skin:  No rashes no nodules Neuro:  CN II through XII intact, motor grossly intact  EKG - NSR  Assess/Plan:  1.PAF - he is maintaining NSR. He will continue dofetilide. 2. Obesity - He has lost 15 lbs since his last visit.  3. HTN - his bp is well controlled. No change in meds. 4. Dyslipidemia - he will continue his pravastatin.   Carleene Overlie Tajanay Hurley,MD

## 2022-12-29 NOTE — Patient Instructions (Signed)
Medication Instructions:  Your physician recommends that you continue on your current medications as directed. Please refer to the Current Medication list given to you today.  *If you need a refill on your cardiac medications before your next appointment, please call your pharmacy*  Follow-Up: At [facility], you and your health needs are our priority.  As part of our continuing mission to provide you with exceptional heart care, we have created designated Provider Care Teams.  These Care Teams include your primary Cardiologist (physician) and Advanced Practice Providers (APPs -  Physician Assistants and Nurse Practitioners) who all work together to provide you with the care you need, when you need it.   Your next appointment:   1 year(s)  Provider:   You may see Cristopher Peru, MD or one of the following Advanced Practice Providers on your designated Care Team:   Tommye Standard, [place] "Jonni Sanger" [place], [place], NP

## 2023-02-22 ENCOUNTER — Other Ambulatory Visit: Payer: Self-pay | Admitting: *Deleted

## 2023-02-22 DIAGNOSIS — I6523 Occlusion and stenosis of bilateral carotid arteries: Secondary | ICD-10-CM

## 2023-02-26 ENCOUNTER — Ambulatory Visit (HOSPITAL_COMMUNITY)
Admission: RE | Admit: 2023-02-26 | Discharge: 2023-02-26 | Disposition: A | Discharge status: Home / Self Care | Payer: Medicare HMO | Source: Ambulatory Visit | Attending: Surgery | Admitting: Surgery

## 2023-02-26 DIAGNOSIS — I6523 Occlusion and stenosis of bilateral carotid arteries: Secondary | ICD-10-CM | POA: Diagnosis present

## 2023-03-21 ENCOUNTER — Ambulatory Visit: Payer: Medicare HMO | Admitting: Physician Assistant

## 2023-03-21 VITALS — BP 118/73 | HR 68 | Temp 97.6°F | Wt 187.0 lb

## 2023-03-21 DIAGNOSIS — Z9889 Other specified postprocedural states: Secondary | ICD-10-CM

## 2023-03-21 DIAGNOSIS — I6523 Occlusion and stenosis of bilateral carotid arteries: Secondary | ICD-10-CM

## 2023-03-21 NOTE — Progress Notes (Signed)
History of Present Illness:  Patient is a 82 y.o. year old male who presents for evaluation of carotid stenosis.   He is s/p left CEA on 05/03/21 by Dr. Arbie Cookey due to symptomatic carotid stenosis causing amaurosis fugax.  He also had right carotid endarterectomy by Dr. Arbie Cookey in 2019 due to high-grade asymptomatic stenosis.  He had resolution of visual changes and no new neurological symptoms.  He goes to Ophthalmologist every 6 months  and is planning on cataract surgery in the next few weeks.     The pt is on a statin for cholesterol management.  The pt is on a daily aspirin.   Other AC:  Eliquis The pt is on ARB for hypertension.   The pt is not diabetic.   Tobacco hx:  Former     Past Medical History:  Diagnosis Date   Abdominal obesity    Alcohol abuse    Arthritis    Atrial fibrillation (HCC)    Carotid artery occlusion    Carotid bruit    Chest pain    Chondrodermatitis nodularis helicis, left    Coronary artery disease    Dyspnea    GERD (gastroesophageal reflux disease)    HOH (hard of hearing)    Hyperlipidemia    Hypertension    Myocardial infarction (HCC) 1991   Thyroid disease    Vitamin D deficiency     Past Surgical History:  Procedure Laterality Date   CARDIOVERSION N/A 07/25/2018   Procedure: CARDIOVERSION;  Surgeon: Thurmon Fair, MD;  Location: [facility];  Service: Cardiovascular;  Laterality: N/A;   CAROTID ENDARTERECTOMY Right 03/29/2018   CAROTID STENT  2004   CORONARY ARTERY BYPASS GRAFT     1 vessel   ENDARTERECTOMY Right 03/29/2018   Procedure: Right Carotid Endartarectomy;  Surgeon: Larina Earthly, MD;  [facility]: [facility];  Service: Vascular;  Laterality: Right;   ENDARTERECTOMY Left 05/03/2021   Procedure: LEFT CAROTID ENDARTERECTOMY WITH PATCH ANGIOPLASTY;  Surgeon: Larina Earthly, MD;  Location: [facility];  Service: Vascular;  Laterality: Left;   LEFT HEART CATH AND CORS/GRAFTS ANGIOGRAPHY N/A 05/10/2017   Procedure: Left Heart Cath and  Cors/Grafts Angiography;  Surgeon: Runell Gess, MD;  Location: [facility];  Service: Cardiovascular;  Laterality: N/A;   PATCH ANGIOPLASTY Right 03/29/2018   Procedure: PATCH ANGIOPLASTY;  Surgeon: Larina Earthly, MD;  Location: [facility];  Service: Vascular;  Laterality: Right;     Social History Social History   Tobacco Use   Smoking status: Former    Years: 32    Types: Cigarettes    Quit date: 1991    Years since quitting: 33.3    Passive exposure: Never   Smokeless tobacco: Never  Vaping Use   Vaping Use: Never used  Substance Use Topics   Alcohol use: Not Currently   Drug use: No    Family History Family History  Problem Relation Age of Onset   Clotting disorder Father    Clotting disorder Sister     Allergies  Allergies  Allergen Reactions   Perflutren Lipid Microsphere Other (See Comments)    Severe lower back, tailbone, and butt pain. Minor chest pain     Current Outpatient Medications  Medication Sig Dispense Refill   apixaban (ELIQUIS) 5 MG TABS tablet Take 1 tablet (5 mg total) by mouth 2 (two) times daily. 180 tablet 3   aspirin EC 81 MG tablet Take 81 mg by mouth daily.  Swallow whole.     beclomethasone (QVAR) 80 MCG/ACT inhaler Inhale 1 puff into the lungs 2 (two) times daily as needed (for shortness of breath).      bismuth subsalicylate (PEPTO BISMOL) 262 MG/15ML suspension Take 30 mLs by mouth every 6 (six) hours as needed for indigestion.     dofetilide (TIKOSYN) 500 MCG capsule Take 1 capsule (500 mcg total) by mouth 2 (two) times daily. 60 capsule 6   finasteride (PROSCAR) 5 MG tablet Take 5 mg by mouth daily.     gabapentin (NEURONTIN) 300 MG capsule Take 300 mg by mouth daily.     HYDROcodone-acetaminophen (NORCO) 7.5-325 MG tablet Take 1 tablet by mouth every 6 (six) hours as needed (for pain.). 15 tablet 0   losartan (COZAAR) 100 MG tablet Take 100 mg by mouth daily.     meclizine (ANTIVERT) 25 MG tablet Take 25 mg by mouth 3 (three)  times daily as needed for dizziness.     nitroGLYCERIN (NITROSTAT) 0.4 MG SL tablet Place 0.4 mg under the tongue every 5 (five) minutes as needed for chest pain.     Omega-3 Fatty Acids (FISH OIL ULTRA) 1400 MG CAPS Take 1,400 mg by mouth daily.     omeprazole (PRILOSEC) 20 MG capsule Take 20 mg by mouth daily before breakfast.      potassium chloride SA (KLOR-CON) 20 MEQ tablet Take 20 mEq by mouth daily.     pravastatin (PRAVACHOL) 80 MG tablet Take 1 tablet (80 mg total) by mouth daily.     Vitamin D, Ergocalciferol, (DRISDOL) 50000 units CAPS capsule Take 50,000 Units by mouth every Sunday.      No current facility-administered medications for this visit.    ROS:   General:  No weight loss, Fever, chills  HEENT: No recent headaches, no nasal bleeding, no visual changes, no sore throat  Neurologic: No dizziness, blackouts, seizures. No recent symptoms of stroke or mini- stroke. No recent episodes of slurred speech, or temporary blindness.  Cardiac: No recent episodes of chest pain/pressure, no shortness of breath at rest.  No shortness of breath with exertion.  Denies history of atrial fibrillation or irregular heartbeat  Vascular: No history of rest pain in feet.  No history of claudication.  No history of non-healing ulcer, No history of DVT   Pulmonary: No home oxygen, no productive cough, no hemoptysis,  No asthma or wheezing  Musculoskeletal:  [ ]  Arthritis, [ ]  Low back pain,  [ ]  Joint pain  Hematologic:No history of hypercoagulable state.  No history of easy bleeding.  No history of anemia  Gastrointestinal: No hematochezia or melena,  No gastroesophageal reflux, no trouble swallowing  Urinary: [ ]  chronic Kidney disease, [ ]  on HD - [ ]  MWF or [ ]  TTHS, [ ]  Burning with urination, [ ]  Frequent urination, [ ]  Difficulty urinating;   Skin: No rashes  Psychological: No history of anxiety,  No history of depression   Physical Examination  Vitals:   03/21/23 1021  03/21/23 1023  BP: 135/77 118/73  Pulse: 68   Temp: 97.6 F (36.4 C)   TempSrc: Temporal   SpO2: 96%   Weight: 187 lb (84.8 kg)     Body mass index is 28.43 kg/m.  General:  Alert and oriented, no acute distress HEENT: Normal Neck: No bruit or JVD Pulmonary: Clear to auscultation bilaterally Cardiac: Regular Rate and Rhythm without murmur Gastrointestinal: Soft, non-tender, non-distended, no mass, no scars Skin: No rash Extremity Pulses:  radial,  dorsalis pedis, pulses bilaterally Musculoskeletal: No deformity or edema  Neurologic: Upper and lower extremity motor 5/5 and symmetric  DATA:  Right Carotid Findings:  +----------+--------+--------+--------+------------------+--------+           PSV cm/sEDV cm/sStenosisPlaque DescriptionComments  +----------+--------+--------+--------+------------------+--------+  CCA Prox  166     14                                          +----------+--------+--------+--------+------------------+--------+  CCA Mid   114     14                                          +----------+--------+--------+--------+------------------+--------+  CCA Distal105     13                                          +----------+--------+--------+--------+------------------+--------+  ICA Prox  107     29      1-39%                               +----------+--------+--------+--------+------------------+--------+  ICA Mid   115     20                                          +----------+--------+--------+--------+------------------+--------+  ICA Distal117     24                                          +----------+--------+--------+--------+------------------+--------+  ECA      111     4                                           +----------+--------+--------+--------+------------------+--------+   +----------+--------+-------+----------------+-------------------+           PSV cm/sEDV cmsDescribe         Arm Pressure (mmHG)  +----------+--------+-------+----------------+-------------------+  Subclavian207    5      Multiphasic, [id number]                  +----------+--------+-------+----------------+-------------------+   +---------+--------+--+--------+--+---------+  VertebralPSV cm/s74EDV cm/s16Antegrade  +---------+--------+--+--------+--+---------+      Left Carotid Findings:  +----------+--------+--------+--------+------------------+--------+           PSV cm/sEDV cm/sStenosisPlaque DescriptionComments  +----------+--------+--------+--------+------------------+--------+  CCA Prox  114     16                                          +----------+--------+--------+--------+------------------+--------+  CCA Mid   110     23              heterogenous                +----------+--------+--------+--------+------------------+--------+  CCA [id number]  17                                          +----------+--------+--------+--------+------------------+--------+  ICA Prox  75      17      1-39%                               +----------+--------+--------+--------+------------------+--------+  ICA Mid   85      23                                          +----------+--------+--------+--------+------------------+--------+  ICA Distal93      25                                          +----------+--------+--------+--------+------------------+--------+  ECA      92      4                                           +----------+--------+--------+--------+------------------+--------+   +----------+--------+--------+----------------+-------------------+           PSV cm/sEDV cm/sDescribe        Arm Pressure (mmHG)  +----------+--------+--------+----------------+-------------------+  Subclavian178    6       Multiphasic, [id number]                  +----------+--------+--------+----------------+-------------------+    +---------+--------+--+--------+-+-----------------------------------------  ----+  VertebralPSV cm/s25EDV cm/s2Dampened antegrade waveform, possibly                                       reconstituted.                                  +---------+--------+--+--------+-+-----------------------------------------  ----+      Summary:  Right Carotid: Velocities in the right ICA are consistent with a 1-39%  stenosis.   Left Carotid: Velocities in the left ICA are consistent with a 1-39%  stenosis.   Vertebrals: Right vertebral artery demonstrates antegrade flow. 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  Subclavians: Normal flow hemodynamics were seen in bilateral subclavian               arteries.    ASSESSMENT: 82  y.o. male here for follow up for carotid artery stenosis. He is s/p left CEA on 05/03/21 by Dr. Arbie Cookey due to symptomatic carotid stenosis causing amaurosis fugax.  He also had right carotid endarterectomy by Dr. Arbie Cookey in 2019 due to high-grade asymptomatic stenosis. He is without any new neurological symptoms.  - Duplex today shows 1-39% bilateral ICA stenosis. Normal flow in the vertebral and subclavian arteries bilaterally - Continue Aspirin and Statin - Reviewed signs and symptoms of TIA/ Stroke and he understands should these occur to seek immediate medical attention and call 911 -He will follow up  in 1 year with carotid duplex       Mosetta Pigeon PA-C [facility] Office: [phone]  MD in clinic [place]

## 2023-03-27 ENCOUNTER — Ambulatory Visit: Payer: Medicare Other | Admitting: Internal Medicine

## 2023-04-02 ENCOUNTER — Other Ambulatory Visit: Payer: Self-pay

## 2023-04-02 DIAGNOSIS — I6523 Occlusion and stenosis of bilateral carotid arteries: Secondary | ICD-10-CM

## 2023-06-15 ENCOUNTER — Emergency Department (HOSPITAL_BASED_OUTPATIENT_CLINIC_OR_DEPARTMENT_OTHER): Payer: Medicare HMO

## 2023-06-15 ENCOUNTER — Emergency Department (HOSPITAL_BASED_OUTPATIENT_CLINIC_OR_DEPARTMENT_OTHER)
Admission: EM | Admit: 2023-06-15 | Discharge: 2023-06-15 | Disposition: A | Discharge status: Home / Self Care | Payer: Medicare HMO | Attending: Emergency Medicine | Admitting: Emergency Medicine

## 2023-06-15 ENCOUNTER — Other Ambulatory Visit: Payer: Self-pay

## 2023-06-15 ENCOUNTER — Encounter (HOSPITAL_BASED_OUTPATIENT_CLINIC_OR_DEPARTMENT_OTHER): Payer: Self-pay

## 2023-06-15 DIAGNOSIS — Z7901 Long term (current) use of anticoagulants: Secondary | ICD-10-CM | POA: Diagnosis not present

## 2023-06-15 DIAGNOSIS — W108XXA Fall (on) (from) other stairs and steps, initial encounter: Secondary | ICD-10-CM | POA: Insufficient documentation

## 2023-06-15 DIAGNOSIS — Y9301 Activity, walking, marching and hiking: Secondary | ICD-10-CM | POA: Diagnosis not present

## 2023-06-15 DIAGNOSIS — S0181XA Laceration without foreign body of other part of head, initial encounter: Secondary | ICD-10-CM | POA: Insufficient documentation

## 2023-06-15 DIAGNOSIS — Y92009 Unspecified place in unspecified non-institutional (private) residence as the place of occurrence of the external cause: Secondary | ICD-10-CM | POA: Diagnosis not present

## 2023-06-15 DIAGNOSIS — W19XXXA Unspecified fall, initial encounter: Secondary | ICD-10-CM

## 2023-06-15 DIAGNOSIS — S0990XA Unspecified injury of head, initial encounter: Secondary | ICD-10-CM | POA: Diagnosis present

## 2023-06-15 MED ORDER — LIDOCAINE-EPINEPHRINE (PF) 2 %-1:200000 IJ SOLN
10.0000 mL | Freq: Once | INTRAMUSCULAR | Status: AC
  Administered 2023-06-15: 10 mL via INTRADERMAL
  Filled 2023-06-15: qty 20

## 2023-06-15 MED ORDER — LIDOCAINE-EPINEPHRINE 2 %-1:100000 IJ SOLN: 20.0000 mL | Freq: Once | INTRAMUSCULAR | Status: DC

## 2023-06-15 NOTE — ED Provider Notes (Signed)
[facility] Provider Note   CSN: [id number] Arrival date & time: 06/15/23  2124     History Chief Complaint  Patient presents with   Fall    HPI Kevin Gonzales is a 82 y.o. male presenting for a chief complaint of fall on thinners.  He is a 82 year old male who was walking up the steps carrying cat litter when he lost his balance and fell forward.  The cat later braced most of his fall as it was a large bag, but he hit his forehead on the concrete step.  Did not lose consciousness had substantial bleeding has irrigated the wound.  Is up-to-date on vaccines.  Denies fevers chills nausea vomiting syncope or shortness of breath..   Patient's recorded medical, surgical, social, medication list and allergies were reviewed in the Snapshot window as part of the initial history.   Review of Systems   Review of Systems  Constitutional:  Negative for chills and fever.  HENT:  Negative for ear pain and sore throat.   Eyes:  Negative for pain and visual disturbance.  Respiratory:  Negative for cough and shortness of breath.   Cardiovascular:  Negative for chest pain and palpitations.  Gastrointestinal:  Negative for abdominal pain and vomiting.  Genitourinary:  Negative for dysuria and hematuria.  Musculoskeletal:  Negative for arthralgias and back pain.  Skin:  Negative for color change and rash.  Neurological:  Negative for seizures and syncope.  All other systems reviewed and are negative.   Physical Exam Updated Vital Signs BP (!) 140/66 (BP Location: Left Arm)   Pulse 72   Temp 98.1 F (36.7 C) (Oral)   Resp 16   Ht 5\' 8"  (1.727 m)   Wt 86.2 kg   SpO2 96%   BMI 28.89 kg/m  Physical Exam Vitals and nursing note reviewed.  Constitutional:      Appearance: He is well-developed.  HENT:     Head: Normocephalic and atraumatic.     Nose: No congestion or rhinorrhea.     Mouth/Throat:     Mouth: Mucous membranes are moist.      Pharynx: Oropharynx is clear. No oropharyngeal exudate.  Eyes:     Conjunctiva/sclera: Conjunctivae normal.     Pupils: Pupils are equal, round, and reactive to light.  Cardiovascular:     Rate and Rhythm: Normal rate and regular rhythm.     Heart sounds: No murmur heard. Pulmonary:     Effort: Pulmonary effort is normal. No respiratory distress.     Breath sounds: Normal breath sounds.  Abdominal:     Palpations: Abdomen is soft.     Tenderness: There is no abdominal tenderness.  Musculoskeletal:        General: No swelling, deformity or signs of injury (3 cm curvilinear laceration across patient's right forehead.). Normal range of motion.     Cervical back: Neck supple. No rigidity or tenderness.  Skin:    General: Skin is warm and dry.  Neurological:     General: No focal deficit present.     Mental Status: He is alert and oriented to person, place, and time. Mental status is at baseline.     Cranial Nerves: No cranial nerve deficit.     Motor: No weakness.      ED Course/ Medical Decision Making/ A&P    Procedures .Marland KitchenLaceration Repair  Date/Time: 06/15/2023 10:28 PM  Performed by: Glyn Ade, MD Authorized by: Glyn Ade, MD  Consent:    Consent obtained:  Verbal   Consent given by:  Patient   Risks, benefits, and alternatives were discussed: yes   Laceration details:    Location:  Face   Face location:  Forehead   Length (cm):  3 Skin repair:    Repair method:  Sutures Repair type:    Repair type:  Simple Post-procedure details:    Procedure completion:  Tolerated    Medications Ordered in ED Medications  lidocaine-EPINEPHrine (XYLOCAINE W/EPI) 2 %-1:200000 (PF) injection 10 mL (has no administration in time range)   Medical Decision Making:   Kevin Gonzales is a 82 y.o. male who presented to the ED today with a fall at their living facility detailed above. They are on a blood thinner. Additional history discussed with patient's  family/caregivers.  Patient placed on continuous vitals and telemetry monitoring while in ED which was reviewed periodically.   Complete initial physical exam performed, notably the patient  was hemodynamically stable  in no acute distress. Besides facial laceration, no obvious deformities or injuries appreciated on extensive physical exam including active range of motion of all joints.     Reviewed and confirmed nursing documentation for past medical history, family history, social history.    Initial Assessment/Plan:   This is a patient presenting with a moderate blunt mechanism trauma.  As such, I have considered intracranial injuries including intracranial hemorrhage, intrathoracic injuries including blunt myocardial or blunt lung injury, blunt abdominal injuries including aortic dissection, bladder injury, spleen injury, liver injury and I have considered orthopedic injuries including extremity or spinal injury. This is most consistent with an acute life/limb threatening illness complicated by underlying chronic conditions.  With the patient's presentation of moderate mechanism trauma but an otherwise reassuring exam, patient warrants targeted evaluation for potential traumatic injuries. Will proceed with targeted evaluation for potential injuries. Will proceed with CT Head, Cervical Spine CT. Images reviewed and agree with radiology interpretation.  CT CERVICAL SPINE WO CONTRAST  Result Date: 06/15/2023 CLINICAL DATA:  Larey Seat, right supraorbital laceration EXAM: CT CERVICAL SPINE WITHOUT CONTRAST TECHNIQUE: Multidetector CT imaging of the cervical spine was performed without intravenous contrast. Multiplanar CT image reconstructions were also generated. RADIATION DOSE REDUCTION: This exam was performed according to the departmental dose-optimization program which includes automated exposure control, adjustment of the mA and/or kV according to patient size and/or use of iterative reconstruction  technique. COMPARISON:  None Available. FINDINGS: Alignment: Reversal cervical lordosis likely due to extensive multilevel degenerative changes. Otherwise alignment is anatomic. Skull base and vertebrae: No acute fracture. No primary bone lesion or focal pathologic process. Soft tissues and spinal canal: No prevertebral fluid or swelling. No visible canal hematoma. Disc levels: There is diffuse cervical spondylosis and facet hypertrophy, most pronounced from C4-5 through C6-7. Severe hypertrophic changes at the C1-C2 interface. Marked pannus along the posterior aspect of the odontoid. Upper chest: Airway is patent. Biapical pleural and parenchymal scarring. Other: Reconstructed images demonstrate no additional findings. IMPRESSION: 1. Extensive multilevel cervical degenerative changes. 2. No acute cervical spine fracture Electronically Signed   By: Sharlet Salina M.D.   On: 06/15/2023 22:14   CT HEAD WO CONTRAST ( )  Result Date: 06/15/2023 CLINICAL DATA:  Larey Seat, anticoagulated, right supraorbital laceration EXAM: CT HEAD WITHOUT CONTRAST TECHNIQUE: Contiguous axial images were obtained from the base of the skull through the vertex without intravenous contrast. RADIATION DOSE REDUCTION: This exam was performed according to the departmental dose-optimization program which includes automated exposure control, adjustment of the  mA and/or kV according to patient size and/or use of iterative reconstruction technique. COMPARISON:  None Available. FINDINGS: Brain: No acute infarct or hemorrhage. Hypodensities in the periventricular white matter most consistent with chronic small vessel ischemic changes. Lateral ventricles and midline structures are unremarkable. No acute extra-axial fluid collections. No mass effect. Vascular: No hyperdense vessel or unexpected calcification. Skull: Normal. Negative for fracture or focal lesion. Sinuses/Orbits: No acute finding. Other: None. IMPRESSION: 1. No acute intracranial  process. Electronically Signed   By: Sharlet Salina M.D.   On: 06/15/2023 22:11    Final Reassessment and Plan:   Laceration repaired as above.  CT scan without focal pathology.  Patient stable for outpatient care and management.  Disposition:   Based on the above findings, I believe this patient is stable for admission.    Patient/family educated about specific findings on our evaluation and explained exact reasons for admission.  Patient/family educated about clinical situation and time was allowed to answer questions.   Admission team communicated with and agreed with need for admission. Patient admitted. Patient ready to move at this time.     Emergency Department Medication Summary:   Medications  lidocaine-EPINEPHrine (XYLOCAINE W/EPI) 2 %-1:200000 (PF) injection 10 mL (has no administration in time range)            Clinical Impression:  1. Fall in home, initial encounter      Discharge   Final Clinical Impression(s) / ED Diagnoses Final diagnoses:  Fall in home, initial encounter    Rx / DC Orders ED Discharge Orders     None         Glyn Ade, MD 06/15/23 2229

## 2023-06-15 NOTE — ED Triage Notes (Signed)
Pt fell about 90 minutes PTA. No LOC. Did hit head. Pt takes ASA and Eliquis. Bleeding controlled in triage. Lac to R eyebrow.

## 2023-06-17 ENCOUNTER — Emergency Department (HOSPITAL_BASED_OUTPATIENT_CLINIC_OR_DEPARTMENT_OTHER): Payer: Medicare HMO

## 2023-06-17 ENCOUNTER — Other Ambulatory Visit: Payer: Self-pay

## 2023-06-17 ENCOUNTER — Emergency Department (HOSPITAL_BASED_OUTPATIENT_CLINIC_OR_DEPARTMENT_OTHER)
Admission: EM | Admit: 2023-06-17 | Discharge: 2023-06-17 | Disposition: A | Discharge status: Home / Self Care | Payer: Medicare HMO | Source: Home / Self Care | Attending: Emergency Medicine | Admitting: Emergency Medicine

## 2023-06-17 ENCOUNTER — Encounter (HOSPITAL_BASED_OUTPATIENT_CLINIC_OR_DEPARTMENT_OTHER): Payer: Self-pay

## 2023-06-17 DIAGNOSIS — S5011XA Contusion of right forearm, initial encounter: Secondary | ICD-10-CM | POA: Diagnosis not present

## 2023-06-17 DIAGNOSIS — R519 Headache, unspecified: Secondary | ICD-10-CM | POA: Insufficient documentation

## 2023-06-17 DIAGNOSIS — M545 Low back pain, unspecified: Secondary | ICD-10-CM | POA: Diagnosis not present

## 2023-06-17 DIAGNOSIS — I2510 Atherosclerotic heart disease of native coronary artery without angina pectoris: Secondary | ICD-10-CM | POA: Insufficient documentation

## 2023-06-17 DIAGNOSIS — Z7982 Long term (current) use of aspirin: Secondary | ICD-10-CM | POA: Diagnosis not present

## 2023-06-17 DIAGNOSIS — I10 Essential (primary) hypertension: Secondary | ICD-10-CM | POA: Diagnosis not present

## 2023-06-17 DIAGNOSIS — S5012XA Contusion of left forearm, initial encounter: Secondary | ICD-10-CM | POA: Diagnosis not present

## 2023-06-17 DIAGNOSIS — Z951 Presence of aortocoronary bypass graft: Secondary | ICD-10-CM | POA: Diagnosis not present

## 2023-06-17 DIAGNOSIS — S8012XA Contusion of left lower leg, initial encounter: Secondary | ICD-10-CM | POA: Diagnosis not present

## 2023-06-17 DIAGNOSIS — Y92410 Unspecified street and highway as the place of occurrence of the external cause: Secondary | ICD-10-CM | POA: Diagnosis not present

## 2023-06-17 DIAGNOSIS — S8001XA Contusion of right knee, initial encounter: Secondary | ICD-10-CM | POA: Insufficient documentation

## 2023-06-17 DIAGNOSIS — S59911A Unspecified injury of right forearm, initial encounter: Secondary | ICD-10-CM | POA: Diagnosis present

## 2023-06-17 DIAGNOSIS — Z7901 Long term (current) use of anticoagulants: Secondary | ICD-10-CM | POA: Insufficient documentation

## 2023-06-17 DIAGNOSIS — Z79899 Other long term (current) drug therapy: Secondary | ICD-10-CM | POA: Diagnosis not present

## 2023-06-17 DIAGNOSIS — M5136 Other intervertebral disc degeneration, lumbar region: Secondary | ICD-10-CM

## 2023-06-17 MED ORDER — ACETAMINOPHEN 500 MG PO TABS
1000.0000 mg | ORAL_TABLET | Freq: Once | ORAL | Status: AC
  Administered 2023-06-17: 1000 mg via ORAL
  Filled 2023-06-17: qty 2
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
## 2023-06-17 NOTE — ED Notes (Signed)
Pt transported to imaging.

## 2023-06-17 NOTE — ED Notes (Signed)
[place] Hard Collar medium placed on pt
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
## 2023-06-20 ENCOUNTER — Ambulatory Visit
Admission: RE | Admit: 2023-06-20 | Discharge: 2023-06-20 | Disposition: A | Discharge status: Home / Self Care | Payer: Medicare HMO | Source: Ambulatory Visit | Attending: Family Medicine | Admitting: Family Medicine

## 2023-06-20 VITALS — BP 170/91 | HR 74 | Temp 97.8°F | Resp 18

## 2023-06-20 DIAGNOSIS — M47816 Spondylosis without myelopathy or radiculopathy, lumbar region: Secondary | ICD-10-CM

## 2023-06-20 DIAGNOSIS — S39012D Strain of muscle, fascia and tendon of lower back, subsequent encounter: Secondary | ICD-10-CM | POA: Diagnosis not present

## 2023-06-20 DIAGNOSIS — M542 Cervicalgia: Secondary | ICD-10-CM | POA: Diagnosis not present

## 2023-06-20 MED ORDER — PREDNISONE 10 MG (21) PO TBPK: ORAL_TABLET | Freq: Every day | ORAL | 0 refills | Status: DC

## 2023-06-20 MED ORDER — TIZANIDINE HCL 4 MG PO TABS: 4.0000 mg | ORAL_TABLET | Freq: Every day | ORAL | 0 refills | Status: DC
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
## 2023-06-20 NOTE — Discharge Instructions (Signed)
Take tizanidine at bedtime May take 1 or 2.  Muscle relaxer Take the prednisone as directed  Take hydrocodone as needed for pain.  1/2 tab for moderate pain, whole tab for severe pain If you take the hydrocodone every day add one scoop of miralax ( or generic) every day Drink plenty of water See your doctor in 2 days

## 2023-06-22 ENCOUNTER — Ambulatory Visit: Payer: Medicare HMO | Admitting: Physician Assistant

## 2023-09-03 ENCOUNTER — Ambulatory Visit: Payer: Medicare HMO | Admitting: Surgery

## 2023-09-03 ENCOUNTER — Encounter (HOSPITAL_COMMUNITY): Payer: Medicare HMO

## 2023-09-03 ENCOUNTER — Other Ambulatory Visit (HOSPITAL_COMMUNITY): Payer: Medicare HMO

## 2023-12-24 ENCOUNTER — Ambulatory Visit: Payer: Medicare HMO | Admitting: Student

## 2023-12-25 ENCOUNTER — Encounter: Payer: Self-pay | Admitting: Cardiovascular Disease

## 2023-12-25 ENCOUNTER — Ambulatory Visit: Payer: Medicare HMO | Attending: Cardiovascular Disease | Admitting: Cardiovascular Disease

## 2023-12-25 VITALS — BP 134/74 | HR 65 | Ht 68.0 in | Wt 198.2 lb

## 2023-12-25 DIAGNOSIS — I480 Paroxysmal atrial fibrillation: Secondary | ICD-10-CM

## 2023-12-25 DIAGNOSIS — I25110 Atherosclerotic heart disease of native coronary artery with unstable angina pectoris: Secondary | ICD-10-CM | POA: Diagnosis not present

## 2023-12-25 DIAGNOSIS — I6521 Occlusion and stenosis of right carotid artery: Secondary | ICD-10-CM

## 2023-12-25 DIAGNOSIS — E7800 Pure hypercholesterolemia, unspecified: Secondary | ICD-10-CM

## 2023-12-25 DIAGNOSIS — I10 Essential (primary) hypertension: Secondary | ICD-10-CM

## 2023-12-25 NOTE — Patient Instructions (Addendum)
Medication Instructions:  Your physician recommends that you continue on your current medications as directed. Please refer to the Current Medication list given to you today.  *If you need a refill on your cardiac medications before your next appointment, please call your pharmacy*   Lab Work: Your physician recommends that you have labs drawn today: BMET & CBC  If you have labs (blood work) drawn today and your tests are completely normal, you will receive your results only by: MyChart Message (if you have MyChart) OR A paper copy in the mail If you have any lab test that is abnormal or we need to change your treatment, we will call you to review the results.   Testing/Procedures: Your physician has requested that you have an echocardiogram. Echocardiography is a painless test that uses sound waves to create images of your heart. It provides your doctor with information about the size and shape of your heart and how well your heart's chambers and valves are working. This procedure takes approximately one hour. There are no restrictions for this procedure. Please do NOT wear cologne, perfume, aftershave, or lotions (deodorant is allowed). Please arrive 15 minutes prior to your appointment time.  Please note: We ask at that you not bring children with you during ultrasound (echo/ vascular) testing. Due to room size and safety concerns, children are not allowed in the ultrasound rooms during exams. Our front office staff cannot provide observation of children in our lobby area while testing is being conducted. An adult accompanying a patient to their appointment will only be allowed in the ultrasound room at the discretion of the ultrasound technician under special circumstances. We apologize for any inconvenience.    Follow-Up: At [facility], you and your health needs are our priority.  As part of our continuing mission to provide you with exceptional heart care, we have created  designated Provider Care Teams.  These Care Teams include your primary Cardiologist (physician) and Advanced Practice Providers (APPs -  Physician Assistants and Nurse Practitioners) who all work together to provide you with the care you need, when you need it.  We recommend signing up for the patient portal called "MyChart".  Sign up information is provided on this After Visit Summary.  MyChart is used to connect with patients for Virtual Visits (Telemedicine).  Patients are able to view lab/test results, encounter notes, upcoming appointments, etc.  Non-urgent messages can be sent to your provider as well.   To learn more about what you can do with MyChart, go to [email or web address].    Your next appointment:   3 months   Provider:   Nanetta Batty, MD     Other Instructions       Cardiac/Peripheral Catheterization   You are scheduled for a Cardiac Catheterization on Monday, February 24 with Dr. Peter Swaziland.  1. Please arrive at the [facility] (Main Entrance A) at [facility]: [address] [place], [place] [address] at 9:00AM (This time is 2 hour(s) before your procedure to ensure your preparation).   Free valet parking service is available. You will check in at ADMITTING. The support person will be asked to wait in the waiting room.  It is OK to have someone drop you off and come back when you are ready to be discharged.        Special note: Every effort is made to have your procedure done on time. Please understand that emergencies sometimes delay scheduled procedures.  2. Diet: Do not  eat solid foods after midnight.  You may have clear liquids until 5 AM the day of the procedure.  3. Labs: You will need to have blood drawn today (2/11)  4. Medication instructions in preparation for your procedure:    Stop taking Eliquis (Apixiban) on Friday, February 21.   On the morning of your procedure, take Aspirin 81 mg and any morning medicines NOT listed above.  You  may use sips of water.  5. Plan to go home the same day, you will only stay overnight if medically necessary. 6. You MUST have a responsible adult to drive you home. 7. An adult MUST be with you the first 24 hours after you arrive home. 8. Bring a current list of your medications, and the last time and date medication taken. 9. Bring ID and current insurance cards. 10.Please wear clothes that are easy to get on and off and wear slip-on shoes.  Thank you for allowing [place] to care for you!   -- [facility]

## 2023-12-25 NOTE — H&P (View-Only) (Signed)
 12/25/2023 Kevin Gonzales   [birth year]  [id number]  Primary Physician Kevin Plumber, MD Primary Cardiologist: Kevin Gess MD Kevin Gonzales, Kevin Gonzales  HPI:  Kevin Gonzales is a 83 y.o.  mildly overweight married Caucasian male father of 1 , grandfather one grandchild referred by [facility] Gonzales for cardiovascular evaluation and coronary angiography because of ongoing chest pain.  I last saw him in the office 08/02/2022.  He has a history of treated hypertension, hyperlipidemia and remote tobacco abuse having quit in 1991. He had coronary artery bypass grafting times one in 1991 with a RIMA to his RCA by Dr. Arvilla [facility] at [facility]. I placed a 2.75 mm x 16 mm long Taxus drug-eluting stent in his proximal LAD 02/20/04.  I performed cardiac catheterization on him 05/10/2017 revealing normal LV function, a patent RIMA to the RCA with with an occluded native RCA, patent stent to the proximal LAD which I placed in 2005 and high-grade ostial nondominant circumflex.  Medical therapy was recommended.  He recently had elective right carotid endarterectomy performed by [facility] 03/29/2018 and had episodes of PAF in the perioperative period.  He was seen by Dr. Jens Gonzales who began him on Eliquis oral anticoagulation.   He was admitted to the hospital 08/12/2018 for "Tikosyn load" and converted to sinus rhythm which he has maintained on Eliquis oral anticoagulation.  He has maintained sinus rhythm.   Since I saw him in the office a year and a half ago he has noticed new onset dyspnea on exertion and substernal chest pain on exertion over the last 3 to 4 months.   Current Meds  Medication Sig   apixaban (ELIQUIS) 5 MG TABS tablet Take 1 tablet (5 mg total) by mouth 2 (two) times daily.   aspirin EC 81 MG tablet Take 81 mg by mouth daily. Swallow whole.   beclomethasone (QVAR) 80 MCG/ACT inhaler Inhale 1 puff into the lungs 2 (two) times daily as needed (for shortness of breath).     dofetilide (TIKOSYN) 500 MCG capsule Take 1 capsule (500 mcg total) by mouth 2 (two) times daily.   finasteride (PROSCAR) 5 MG tablet Take 5 mg by mouth daily.   gabapentin (NEURONTIN) 300 MG capsule Take 300 mg by mouth daily.   HYDROcodone-acetaminophen (NORCO) 10-325 MG tablet Take 1 tablet by mouth 4 (four) times daily.   losartan (COZAAR) 100 MG tablet Take 100 mg by mouth daily.   meclizine (ANTIVERT) 25 MG tablet Take 25 mg by mouth 3 (three) times daily as needed for dizziness.   Omega-3 Fatty Acids (FISH OIL ULTRA) 1400 MG CAPS Take 1,400 mg by mouth daily.   omeprazole (PRILOSEC) 20 MG capsule Take 20 mg by mouth daily before breakfast.    potassium chloride SA (KLOR-CON) 20 MEQ tablet Take 20 mEq by mouth daily.   pravastatin (PRAVACHOL) 80 MG tablet Take 1 tablet (80 mg total) by mouth daily.   predniSONE (STERAPRED UNI-PAK 21 TAB) 10 MG (21) TBPK tablet Take by mouth daily. tad   tiZANidine (ZANAFLEX) 4 MG tablet Take 1-2 tablets (4-8 mg total) by mouth at bedtime. Muscle relaxer   Vitamin D, Ergocalciferol, (DRISDOL) 50000 units CAPS capsule Take 50,000 Units by mouth every Sunday.      Allergies  Allergen Reactions   Perflutren Lipid Microsphere Other (See Comments)    Severe lower back, tailbone, and butt pain. Minor chest pain    Social History   Socioeconomic  History   Marital status: Married    Spouse name: Not on file   Number of children: Not on file   Years of education: Not on file   Highest education level: Not on file  Occupational History   Not on file  Tobacco Use   Smoking status: Former    Current packs/day: 0.00    Types: Cigarettes    Start date: 81    Quit date: 23    Years since quitting: 34.1    Passive exposure: Never   Smokeless tobacco: Never  Vaping Use   Vaping status: Never Used  Substance and Sexual Activity   Alcohol use: Not Currently   Drug use: No   Sexual activity: Not Currently  Other Topics Concern   Not on file   Social History Narrative   Not on file   Social Drivers of Health   Financial Resource Strain: Not on file  Food Insecurity: Not on file  Transportation Needs: Not on file  Physical Activity: Not on file  Stress: Not on file  Social Connections: Not on file  Intimate Partner Violence: Not on file     Review of Systems: General: negative for chills, fever, night sweats or weight changes.  Cardiovascular: negative for chest pain, dyspnea on exertion, edema, orthopnea, palpitations, paroxysmal nocturnal dyspnea or shortness of breath Dermatological: negative for rash Respiratory: negative for cough or wheezing Urologic: negative for hematuria Abdominal: negative for nausea, vomiting, diarrhea, bright red blood per rectum, melena, or hematemesis Neurologic: negative for visual changes, syncope, or dizziness All other systems reviewed and are otherwise negative except as noted above.    Blood pressure 134/74, pulse 65, height 5\' 8"  (1.727 m), weight 198 lb 3.2 oz (89.9 kg), SpO2 96%.  General appearance: alert and no distress Neck: no adenopathy, no carotid bruit, no JVD, supple, symmetrical, trachea midline, and thyroid not enlarged, symmetric, no tenderness/mass/nodules Lungs: clear to auscultation bilaterally Heart: regular rate and rhythm, S1, S2 normal, no murmur, click, rub or gallop Extremities: extremities normal, atraumatic, no cyanosis or edema Pulses: 2+ and symmetric Skin: Skin color, texture, turgor normal. No rashes or lesions Neurologic: Grossly normal  EKG EKG Interpretation Date/Time:  Tuesday December 25 2023 13:25:32 EST Ventricular Rate:  65 PR Interval:  188 QRS Duration:  80 QT Interval:  418 QTC Calculation: 434 R Axis:   66  Text Interpretation: Sinus rhythm with Premature supraventricular complexes When compared with ECG of 22-Aug-2018 10:39, Premature supraventricular complexes are now Present Confirmed by [facility] Gonzales [phone]) on 12/25/2023  1:27:59 PM    ASSESSMENT AND PLAN:   Coronary artery disease History of CAD status post coronary artery bypass grafting x 1 in 1991 with a RIMA to his RCA by Dr. Arvilla [facility] at [facility].  I placed a 2.75 mm x 16 mm long Taxus drug-eluting stent in his proximal LAD 02/20/04.  I last cath him 05/10/2017 revealing normal LV systolic function, patent RIMA to the distal RCA, occluded native RCA, patent stent to the proximal LAD and high-grade ostial nondominant circumflex.  His left main had moderate disease.  I elected to treat him medically.  Over the last 3 to 4 months he has noticed increasing dyspnea on exertion and exertional chest pain.  I suspect he is had progression of disease.  Will arrange outpatient diagnostic cath with Dr. Swaziland.  I am also going to check a 2D echocardiogram.  Hypercholesterolemia History of hyperlipidemia on high-dose pravastatin with lipid profile performed  by his PCP 06/01/2022 revealed a total cholesterol 137, LDL 70 and HDL 44.  Essential hypertension History of essential hypertension her blood pressure measured today at 134/74.  He is on losartan.  Carotid artery stenosis History of carotid artery disease status post right carotid endarterectomy performed by Dr. Arbie Cookey 03/29/2018 which we follow by duplex ultrasound.  This was last performed 02/26/2023 revealing a widely patent endarterectomy site.  Paroxysmal atrial fibrillation (HCC) History of PAF maintaining sinus rhythm on Tikosyn and apixaban.     Kevin Gess MD FACP,FACC,FAHA, [facility] 12/25/2023 1:41 PM

## 2023-12-25 NOTE — Assessment & Plan Note (Signed)
History of CAD status post coronary artery bypass grafting x 1 in 1991 with a RIMA to his RCA by Dr. Arvilla Market at [facility].  I placed a 2.75 mm x 16 mm long Taxus drug-eluting stent in his proximal LAD 02/20/04.  I last cath him 05/10/2017 revealing normal LV systolic function, patent RIMA to the distal RCA, occluded native RCA, patent stent to the proximal LAD and high-grade ostial nondominant circumflex.  His left main had moderate disease.  I elected to treat him medically.  Over the last 3 to 4 months he has noticed increasing dyspnea on exertion and exertional chest pain.  I suspect he is had progression of disease.  Will arrange outpatient diagnostic cath with Dr. Swaziland.  I am also going to check a 2D echocardiogram.

## 2023-12-25 NOTE — Assessment & Plan Note (Signed)
History of hyperlipidemia on high-dose pravastatin with lipid profile performed by his PCP 06/01/2022 revealed a total cholesterol 137, LDL 70 and HDL 44.

## 2023-12-25 NOTE — Progress Notes (Signed)
12/25/2023 Kevin Gonzales   [birth year]  [id number]  Primary Physician Karle Plumber, MD Primary Cardiologist: Runell Gess MD Nicholes Calamity, [place]  HPI:  Kevin Gonzales is a 83 y.o.  mildly overweight married Caucasian male father of 1 , grandfather one grandchild referred by Arnette Felts for cardiovascular evaluation and coronary angiography because of ongoing chest pain.  I last saw him in the office 08/02/2022.  He has a history of treated hypertension, hyperlipidemia and remote tobacco abuse having quit in 1991. He had coronary artery bypass grafting times one in 1991 with a RIMA to his RCA by Dr. Arvilla Market at [facility]. I placed a 2.75 mm x 16 mm long Taxus drug-eluting stent in his proximal LAD 02/20/04.  I performed cardiac catheterization on him 05/10/2017 revealing normal LV function, a patent RIMA to the RCA with with an occluded native RCA, patent stent to the proximal LAD which I placed in 2005 and high-grade ostial nondominant circumflex.  Medical therapy was recommended.  He recently had elective right carotid endarterectomy performed by Dr. Tawanna Cooler Early 03/29/2018 and had episodes of PAF in the perioperative period.  He was seen by Dr. Jens Som who began him on Eliquis oral anticoagulation.   He was admitted to the hospital 08/12/2018 for "Tikosyn load" and converted to sinus rhythm which he has maintained on Eliquis oral anticoagulation.  He has maintained sinus rhythm.   Since I saw him in the office a year and a half ago he has noticed new onset dyspnea on exertion and substernal chest pain on exertion over the last 3 to 4 months.   Current Meds  Medication Sig   apixaban (ELIQUIS) 5 MG TABS tablet Take 1 tablet (5 mg total) by mouth 2 (two) times daily.   aspirin EC 81 MG tablet Take 81 mg by mouth daily. Swallow whole.   beclomethasone (QVAR) 80 MCG/ACT inhaler Inhale 1 puff into the lungs 2 (two) times daily as needed (for shortness of breath).     dofetilide (TIKOSYN) 500 MCG capsule Take 1 capsule (500 mcg total) by mouth 2 (two) times daily.   finasteride (PROSCAR) 5 MG tablet Take 5 mg by mouth daily.   gabapentin (NEURONTIN) 300 MG capsule Take 300 mg by mouth daily.   HYDROcodone-acetaminophen (NORCO) 10-325 MG tablet Take 1 tablet by mouth 4 (four) times daily.   losartan (COZAAR) 100 MG tablet Take 100 mg by mouth daily.   meclizine (ANTIVERT) 25 MG tablet Take 25 mg by mouth 3 (three) times daily as needed for dizziness.   Omega-3 Fatty Acids (FISH OIL ULTRA) 1400 MG CAPS Take 1,400 mg by mouth daily.   omeprazole (PRILOSEC) 20 MG capsule Take 20 mg by mouth daily before breakfast.    potassium chloride SA (KLOR-CON) 20 MEQ tablet Take 20 mEq by mouth daily.   pravastatin (PRAVACHOL) 80 MG tablet Take 1 tablet (80 mg total) by mouth daily.   predniSONE (STERAPRED UNI-PAK 21 TAB) 10 MG (21) TBPK tablet Take by mouth daily. tad   tiZANidine (ZANAFLEX) 4 MG tablet Take 1-2 tablets (4-8 mg total) by mouth at bedtime. Muscle relaxer   Vitamin D, Ergocalciferol, (DRISDOL) 50000 units CAPS capsule Take 50,000 Units by mouth every Sunday.      Allergies  Allergen Reactions   Perflutren Lipid Microsphere Other (See Comments)    Severe lower back, tailbone, and butt pain. Minor chest pain    Social History   Socioeconomic  History   Marital status: Married    Spouse name: Not on file   Number of children: Not on file   Years of education: Not on file   Highest education level: Not on file  Occupational History   Not on file  Tobacco Use   Smoking status: Former    Current packs/day: 0.00    Types: Cigarettes    Start date: 80    Quit date: 22    Years since quitting: 34.1    Passive exposure: Never   Smokeless tobacco: Never  Vaping Use   Vaping status: Never Used  Substance and Sexual Activity   Alcohol use: Not Currently   Drug use: No   Sexual activity: Not Currently  Other Topics Concern   Not on file   Social History Narrative   Not on file   Social Drivers of Health   Financial Resource Strain: Not on file  Food Insecurity: Not on file  Transportation Needs: Not on file  Physical Activity: Not on file  Stress: Not on file  Social Connections: Not on file  Intimate Partner Violence: Not on file     Review of Systems: General: negative for chills, fever, night sweats or weight changes.  Cardiovascular: negative for chest pain, dyspnea on exertion, edema, orthopnea, palpitations, paroxysmal nocturnal dyspnea or shortness of breath Dermatological: negative for rash Respiratory: negative for cough or wheezing Urologic: negative for hematuria Abdominal: negative for nausea, vomiting, diarrhea, bright red blood per rectum, melena, or hematemesis Neurologic: negative for visual changes, syncope, or dizziness All other systems reviewed and are otherwise negative except as noted above.    Blood pressure 134/74, pulse 65, height 5\' 8"  (1.727 m), weight 198 lb 3.2 oz (89.9 kg), SpO2 96%.  General appearance: alert and no distress Neck: no adenopathy, no carotid bruit, no JVD, supple, symmetrical, trachea midline, and thyroid not enlarged, symmetric, no tenderness/mass/nodules Lungs: clear to auscultation bilaterally Heart: regular rate and rhythm, S1, S2 normal, no murmur, click, rub or gallop Extremities: extremities normal, atraumatic, no cyanosis or edema Pulses: 2+ and symmetric Skin: Skin color, texture, turgor normal. No rashes or lesions Neurologic: Grossly normal  EKG EKG Interpretation Date/Time:  Tuesday December 25 2023 13:25:32 EST Ventricular Rate:  65 PR Interval:  188 QRS Duration:  80 QT Interval:  418 QTC Calculation: 434 R Axis:   66  Text Interpretation: Sinus rhythm with Premature supraventricular complexes When compared with ECG of 22-Aug-2018 10:39, Premature supraventricular complexes are now Present Confirmed by Nanetta Batty [phone]) on 12/25/2023  1:27:59 PM    ASSESSMENT AND PLAN:   Coronary artery disease History of CAD status post coronary artery bypass grafting x 1 in 1991 with a RIMA to his RCA by Dr. Arvilla Market at [facility].  I placed a 2.75 mm x 16 mm long Taxus drug-eluting stent in his proximal LAD 02/20/04.  I last cath him 05/10/2017 revealing normal LV systolic function, patent RIMA to the distal RCA, occluded native RCA, patent stent to the proximal LAD and high-grade ostial nondominant circumflex.  His left main had moderate disease.  I elected to treat him medically.  Over the last 3 to 4 months he has noticed increasing dyspnea on exertion and exertional chest pain.  I suspect he is had progression of disease.  Will arrange outpatient diagnostic cath with Dr. Swaziland.  I am also going to check a 2D echocardiogram.  Hypercholesterolemia History of hyperlipidemia on high-dose pravastatin with lipid profile performed  by his PCP 06/01/2022 revealed a total cholesterol 137, LDL 70 and HDL 44.  Essential hypertension History of essential hypertension her blood pressure measured today at 134/74.  He is on losartan.  Carotid artery stenosis History of carotid artery disease status post right carotid endarterectomy performed by Dr. Arbie Cookey 03/29/2018 which we follow by duplex ultrasound.  This was last performed 02/26/2023 revealing a widely patent endarterectomy site.  Paroxysmal atrial fibrillation (HCC) History of PAF maintaining sinus rhythm on Tikosyn and apixaban.     Runell Gess MD FACP,FACC,FAHA, [facility] 12/25/2023 1:41 PM

## 2023-12-25 NOTE — Assessment & Plan Note (Signed)
History of carotid artery disease status post right carotid endarterectomy performed by Dr. Arbie Cookey 03/29/2018 which we follow by duplex ultrasound.  This was last performed 02/26/2023 revealing a widely patent endarterectomy site.

## 2023-12-25 NOTE — Assessment & Plan Note (Signed)
History of PAF maintaining sinus rhythm on Tikosyn and apixaban.

## 2023-12-25 NOTE — Assessment & Plan Note (Signed)
History of essential hypertension her blood pressure measured today at 134/74.  He is on losartan.

## 2023-12-26 LAB — CBC WITH DIFFERENTIAL/PLATELET
Basophils Absolute: 0.1 10*3/uL (ref 0.0–0.2)
Basos: 1 %
EOS (ABSOLUTE): 0.3 10*3/uL (ref 0.0–0.4)
Eos: 3 %
Hematocrit: 42.5 % (ref 37.5–51.0)
Hemoglobin: 14 g/dL (ref 13.0–17.7)
Immature Grans (Abs): 0 10*3/uL (ref 0.0–0.1)
Immature Granulocytes: 1 %
Lymphocytes Absolute: 1.7 10*3/uL (ref 0.7–3.1)
Lymphs: 19 %
MCH: 32.6 pg (ref 26.6–33.0)
MCHC: 32.9 g/dL (ref 31.5–35.7)
MCV: 99 fL — ABNORMAL HIGH (ref 79–97)
Monocytes Absolute: 0.6 10*3/uL (ref 0.1–0.9)
Monocytes: 7 %
Neutrophils Absolute: 6.1 10*3/uL (ref 1.4–7.0)
Neutrophils: 69 %
Platelets: 211 10*3/uL (ref 150–450)
RBC: 4.3 x10E6/uL (ref 4.14–5.80)
RDW: 11.4 % — ABNORMAL LOW (ref 11.6–15.4)
WBC: 8.7 10*3/uL (ref 3.4–10.8)

## 2023-12-26 LAB — BASIC METABOLIC PANEL
BUN/Creatinine Ratio: 26 — ABNORMAL HIGH (ref 10–24)
BUN: 24 mg/dL (ref 8–27)
CO2: 24 mmol/L (ref 20–29)
Calcium: 9.3 mg/dL (ref 8.6–10.2)
Chloride: 104 mmol/L (ref 96–106)
Creatinine, Ser: 0.91 mg/dL (ref 0.76–1.27)
Glucose: 84 mg/dL (ref 70–99)
Potassium: 4.8 mmol/L (ref 3.5–5.2)
Sodium: 142 mmol/L (ref 134–144)
eGFR: 85 mL/min/{1.73_m2} (ref >59)

## 2023-12-27 ENCOUNTER — Other Ambulatory Visit: Payer: Self-pay | Admitting: Cardiovascular Disease

## 2023-12-27 DIAGNOSIS — I25110 Atherosclerotic heart disease of native coronary artery with unstable angina pectoris: Secondary | ICD-10-CM

## 2023-12-27 DIAGNOSIS — R0609 Other forms of dyspnea: Secondary | ICD-10-CM

## 2023-12-29 NOTE — Progress Notes (Unsigned)
  Cardiology Office Note:  .   Date:  12/29/2023  ID:  Kevin Gonzales, DOB 1941-02-13, MRN [id number] PCP: Karle Plumber, MD  [facility] Providers Cardiologist:  None Electrophysiologist:  Lewayne Bunting, MD {  History of Present Illness: .   Kevin Gonzales is a 83 y.o. male w/PMHx of HTN, HLD, remote smoker, CAD (CABG 1991, PCI 2005), PVD (right CEA 2019), AFib  He saw Dr. Ladona Ridgel 12/29/22, reported no documented or symptoms of Afib. No changes made  Saw Dr. Allyson Sabal 12/25/23: reported some CP, DOE for a few months >> planned for cath (scheduled for 02/04/24) and echo (01/15/24)   Today's visit is scheduled as a 1 year tikosyn visit  ROS:   He is accompanied today by a friend No changes since he saw Dr. Allyson Sabal, no escalation of any of his symptoms. He is having infrequent flutters in his heart beat, lasts seconds only, but new of late, in the last couple months No associated symptoms with them, they are random No bleeding or signs of bleeding No syncope or near syncope. No bleeding or signs of bleeding   Arrhythmia/AAD hx Afib found inter/post op CEA 2019 Tikosyn started 2019  Studies Reviewed: Marland Kitchen    EKG   12/25/23: personally reviewed SR 65bpm, QTc   Risk Assessment/Calculations:    Physical Exam:   VS:  There were no vitals taken for this visit.   Wt Readings from Last 3 Encounters:  12/25/23 198 lb 3.2 oz (89.9 kg)  06/17/23 189 lb 9.5 oz (86 kg)  06/15/23 190 lb (86.2 kg)    GEN: Well nourished, well developed in no acute distress NECK: No JVD; No carotid bruits CARDIAC: RRR, no murmurs, rubs, gallops RESPIRATORY:   CTA b/l without rales, wheezing or rhonchi  ABDOMEN: Soft, non-tender, non-distended EXTREMITIES:  No edema; No deformity    ASSESSMENT AND PLAN: .    Paroxysmal  AFib CHA2DS2Vasc is 4, on Eliquis, appropriately dosed Chronic Tikosyn, stable QTc Infrequent fleeting palpitations of late  Pending cath via Dr. Roe Coombs is post  cath palpitations continue/escalate/change to let us know and we can arrange monitoring No noted contraindicated meds Needs a mag level today  Discussed need for more frequent EP visits, plan for 6 months   Secondary hypercoagulable state 2/2 AFib    His AVS from Dr. Hazle Coca visit states cath is 2/24, though is scheduled for 3/24. My MA has reached out to his nurse/RN that was with hm at the visit to calrify timeline for the cath/schedule date and to call/clarify with the patient   Dispo: as above, sooner if needed  Signed, Sheilah Pigeon, PA-C

## 2023-12-31 ENCOUNTER — Encounter: Payer: Self-pay | Admitting: Physician Assistant

## 2023-12-31 ENCOUNTER — Ambulatory Visit: Payer: Medicare HMO | Attending: Physician Assistant | Admitting: Physician Assistant

## 2023-12-31 VITALS — BP 132/76 | HR 76 | Ht 68.0 in | Wt 192.0 lb

## 2023-12-31 DIAGNOSIS — Z79899 Other long term (current) drug therapy: Secondary | ICD-10-CM | POA: Diagnosis not present

## 2023-12-31 DIAGNOSIS — Z5181 Encounter for therapeutic drug level monitoring: Secondary | ICD-10-CM | POA: Diagnosis not present

## 2023-12-31 DIAGNOSIS — D6869 Other thrombophilia: Secondary | ICD-10-CM | POA: Diagnosis not present

## 2023-12-31 DIAGNOSIS — I480 Paroxysmal atrial fibrillation: Secondary | ICD-10-CM | POA: Diagnosis not present

## 2023-12-31 NOTE — Patient Instructions (Signed)
Medication Instructions:   Your physician recommends that you continue on your current medications as directed. Please refer to the Current Medication list given to you today.  *If you need a refill on your cardiac medications before your next appointment, please call your pharmacy*   Lab Work:   PLEASE GO DOWN STAIRS  [facility]  FIRST FLOOR  [address] ( GET OFF ELEVATORS MAKE A LEFT AND ANOTHER LEFT LAB ON RIGHT DOWN HALLWAY :  MAG TODAY     If you have labs (blood work) drawn today and your tests are completely normal, you will receive your results only by: MyChart Message (if you have MyChart) OR A paper copy in the mail If you have any lab test that is abnormal or we need to change your treatment, we will call you to review the results.    Testing/Procedures:  NONE ORDERED  TODAY    Follow-Up: At [facility], you and your health needs are our priority.  As part of our continuing mission to provide you with exceptional heart care, we have created designated Provider Care Teams.  These Care Teams include your primary Cardiologist (physician) and Advanced Practice Providers (APPs -  Physician Assistants and Nurse Practitioners) who all work together to provide you with the care you need, when you need it.  We recommend signing up for the patient portal called "MyChart".  Sign up information is provided on this After Visit Summary.  MyChart is used to connect with patients for Virtual Visits (Telemedicine).  Patients are able to view lab/test results, encounter notes, upcoming appointments, etc.  Non-urgent messages can be sent to your provider as well.   To learn more about what you can do with MyChart, go to [email or web address].    Your next appointment:   6 month(s)  Provider:   You may see Lewayne Bunting, MD  or one of the following Advanced Practice Providers on your designated Care Team:   Francis Dowse, [place]  Other Instructions    1st Floor: - Lobby -  Registration  - Pharmacy  - Lab - Cafe  2nd Floor: - PV Lab - Diagnostic Testing (echo, CT, nuclear med)  3rd Floor: - Vacant  4th Floor: - TCTS (cardiothoracic surgery) - AFib Clinic - Structural Heart Clinic - Vascular Surgery  - Vascular Ultrasound  5th Floor: - HeartCare Cardiology (general and EP) - Clinical Pharmacy for coumadin, hypertension, lipid, weight-loss medications, and med management appointments    Valet parking services will be available as well.

## 2024-01-01 LAB — MAGNESIUM: Magnesium: 2.2 mg/dL (ref 1.6–2.3)

## 2024-01-04 ENCOUNTER — Other Ambulatory Visit: Payer: Self-pay | Admitting: Cardiovascular Disease

## 2024-01-04 DIAGNOSIS — I10 Essential (primary) hypertension: Secondary | ICD-10-CM

## 2024-01-04 DIAGNOSIS — I25110 Atherosclerotic heart disease of native coronary artery with unstable angina pectoris: Secondary | ICD-10-CM

## 2024-01-04 DIAGNOSIS — I480 Paroxysmal atrial fibrillation: Secondary | ICD-10-CM

## 2024-01-04 DIAGNOSIS — I6521 Occlusion and stenosis of right carotid artery: Secondary | ICD-10-CM

## 2024-01-04 DIAGNOSIS — E7800 Pure hypercholesterolemia, unspecified: Secondary | ICD-10-CM

## 2024-01-07 ENCOUNTER — Encounter (HOSPITAL_COMMUNITY): Payer: Self-pay | Admitting: Cardiology

## 2024-01-07 ENCOUNTER — Inpatient Hospital Stay (HOSPITAL_COMMUNITY)
Admission: AD | Admit: 2024-01-07 | Discharge: 2024-01-09 | DRG: 322 | Disposition: A | Discharge status: Home / Self Care | Payer: Medicare HMO | Source: Ambulatory Visit | Attending: Cardiology | Admitting: Cardiology

## 2024-01-07 ENCOUNTER — Other Ambulatory Visit: Payer: Self-pay

## 2024-01-07 ENCOUNTER — Encounter (HOSPITAL_COMMUNITY)
Admission: AD | Disposition: A | Discharge status: Home / Self Care | Payer: Self-pay | Source: Ambulatory Visit | Attending: Cardiology

## 2024-01-07 DIAGNOSIS — E785 Hyperlipidemia, unspecified: Secondary | ICD-10-CM | POA: Diagnosis not present

## 2024-01-07 DIAGNOSIS — I97630 Postprocedural hematoma of a circulatory system organ or structure following a cardiac catheterization: Secondary | ICD-10-CM | POA: Insufficient documentation

## 2024-01-07 DIAGNOSIS — I4892 Unspecified atrial flutter: Secondary | ICD-10-CM | POA: Diagnosis present

## 2024-01-07 DIAGNOSIS — E7800 Pure hypercholesterolemia, unspecified: Secondary | ICD-10-CM | POA: Diagnosis present

## 2024-01-07 DIAGNOSIS — Z87891 Personal history of nicotine dependence: Secondary | ICD-10-CM

## 2024-01-07 DIAGNOSIS — L7632 Postprocedural hematoma of skin and subcutaneous tissue following other procedure: Secondary | ICD-10-CM | POA: Diagnosis not present

## 2024-01-07 DIAGNOSIS — I200 Unstable angina: Principal | ICD-10-CM | POA: Diagnosis present

## 2024-01-07 DIAGNOSIS — Z79899 Other long term (current) drug therapy: Secondary | ICD-10-CM

## 2024-01-07 DIAGNOSIS — I2582 Chronic total occlusion of coronary artery: Secondary | ICD-10-CM | POA: Diagnosis present

## 2024-01-07 DIAGNOSIS — I25110 Atherosclerotic heart disease of native coronary artery with unstable angina pectoris: Principal | ICD-10-CM | POA: Diagnosis present

## 2024-01-07 DIAGNOSIS — I2510 Atherosclerotic heart disease of native coronary artery without angina pectoris: Secondary | ICD-10-CM | POA: Diagnosis present

## 2024-01-07 DIAGNOSIS — Y840 Cardiac catheterization as the cause of abnormal reaction of the patient, or of later complication, without mention of misadventure at the time of the procedure: Secondary | ICD-10-CM | POA: Diagnosis not present

## 2024-01-07 DIAGNOSIS — I10 Essential (primary) hypertension: Secondary | ICD-10-CM | POA: Diagnosis present

## 2024-01-07 DIAGNOSIS — I739 Peripheral vascular disease, unspecified: Secondary | ICD-10-CM | POA: Diagnosis present

## 2024-01-07 DIAGNOSIS — I480 Paroxysmal atrial fibrillation: Secondary | ICD-10-CM | POA: Diagnosis present

## 2024-01-07 DIAGNOSIS — Z7902 Long term (current) use of antithrombotics/antiplatelets: Secondary | ICD-10-CM | POA: Diagnosis not present

## 2024-01-07 DIAGNOSIS — Z7982 Long term (current) use of aspirin: Secondary | ICD-10-CM | POA: Diagnosis not present

## 2024-01-07 DIAGNOSIS — R0609 Other forms of dyspnea: Secondary | ICD-10-CM

## 2024-01-07 DIAGNOSIS — I493 Ventricular premature depolarization: Secondary | ICD-10-CM | POA: Diagnosis present

## 2024-01-07 DIAGNOSIS — Z7901 Long term (current) use of anticoagulants: Secondary | ICD-10-CM | POA: Diagnosis not present

## 2024-01-07 DIAGNOSIS — Z955 Presence of coronary angioplasty implant and graft: Principal | ICD-10-CM

## 2024-01-07 HISTORY — PX: LEFT HEART CATH AND CORS/GRAFTS ANGIOGRAPHY: CATH118250

## 2024-01-07 SURGERY — LEFT HEART CATH AND CORS/GRAFTS ANGIOGRAPHY
Anesthesia: LOCAL

## 2024-01-07 MED ORDER — VERAPAMIL HCL 2.5 MG/ML IV SOLN
INTRAVENOUS | Status: DC | PRN
  Administered 2024-01-07: 10 mL via INTRA_ARTERIAL

## 2024-01-07 MED ORDER — HEPARIN SODIUM (PORCINE) 1000 UNIT/ML IJ SOLN
INTRAMUSCULAR | Status: DC | PRN
  Administered 2024-01-07: 4000 [IU] via INTRAVENOUS

## 2024-01-07 MED ORDER — ASPIRIN 81 MG PO TBEC
81.0000 mg | DELAYED_RELEASE_TABLET | Freq: Every day | ORAL | Status: DC
  Administered 2024-01-08 – 2024-01-09 (×2): 81 mg via ORAL
  Filled 2024-01-07 (×2): qty 1

## 2024-01-07 MED ORDER — CLOPIDOGREL BISULFATE 75 MG PO TABS
75.0000 mg | ORAL_TABLET | Freq: Every day | ORAL | Status: DC
  Administered 2024-01-08 – 2024-01-09 (×2): 75 mg via ORAL
  Filled 2024-01-07 (×2): qty 1

## 2024-01-07 MED ORDER — SODIUM CHLORIDE 0.9 % WEIGHT BASED INFUSION
3.0000 mL/kg/h | INTRAVENOUS | Status: DC
Start: 2024-01-07 — End: 2024-01-07
  Administered 2024-01-07: 250 mL via INTRAVENOUS

## 2024-01-07 MED ORDER — FENTANYL CITRATE (PF) 100 MCG/2ML IJ SOLN
INTRAMUSCULAR | Status: DC | PRN
  Administered 2024-01-07: 25 ug via INTRAVENOUS

## 2024-01-07 MED ORDER — HEPARIN (PORCINE) 25000 UT/250ML-% IV SOLN
1200.0000 [IU]/h | INTRAVENOUS | Status: DC
  Administered 2024-01-07: 1200 [IU]/h via INTRAVENOUS
  Filled 2024-01-07: qty 250

## 2024-01-07 MED ORDER — HYDRALAZINE HCL 20 MG/ML IJ SOLN: 10.0000 mg | INTRAMUSCULAR | Status: AC | PRN

## 2024-01-07 MED ORDER — CLOPIDOGREL BISULFATE 300 MG PO TABS
ORAL_TABLET | ORAL | Status: AC
Start: 2024-01-07 — End: 2024-01-07
  Administered 2024-01-07: 600 mg via ORAL
  Filled 2024-01-07: qty 2

## 2024-01-07 MED ORDER — HEPARIN (PORCINE) IN NACL 1000-0.9 UT/500ML-% IV SOLN
INTRAVENOUS | Status: DC | PRN
  Administered 2024-01-07 (×2): 500 mL

## 2024-01-07 MED ORDER — SODIUM CHLORIDE 0.9% FLUSH: 3.0000 mL | INTRAVENOUS | Status: DC | PRN

## 2024-01-07 MED ORDER — FENTANYL CITRATE (PF) 100 MCG/2ML IJ SOLN
INTRAMUSCULAR | Status: AC
  Filled 2024-01-07: qty 2

## 2024-01-07 MED ORDER — LIDOCAINE HCL (PF) 1 % IJ SOLN
INTRAMUSCULAR | Status: DC | PRN
  Administered 2024-01-07: 2 mL

## 2024-01-07 MED ORDER — ASPIRIN 81 MG PO CHEW: 81.0000 mg | CHEWABLE_TABLET | Freq: Once | ORAL | Status: DC

## 2024-01-07 MED ORDER — HEPARIN SODIUM (PORCINE) 1000 UNIT/ML IJ SOLN
INTRAMUSCULAR | Status: AC
  Filled 2024-01-07: qty 10

## 2024-01-07 MED ORDER — PRAVASTATIN SODIUM 40 MG PO TABS
40.0000 mg | ORAL_TABLET | Freq: Every day | ORAL | Status: DC
  Administered 2024-01-08: 40 mg via ORAL
  Filled 2024-01-07: qty 1

## 2024-01-07 MED ORDER — LOSARTAN POTASSIUM 50 MG PO TABS
100.0000 mg | ORAL_TABLET | Freq: Every day | ORAL | Status: DC
  Administered 2024-01-09: 100 mg via ORAL
  Filled 2024-01-07: qty 2

## 2024-01-07 MED ORDER — ATROPINE SULFATE 1 MG/10ML IJ SOSY
PREFILLED_SYRINGE | INTRAMUSCULAR | Status: DC | PRN
  Administered 2024-01-07: 1 mg via INTRAVENOUS

## 2024-01-07 MED ORDER — SODIUM CHLORIDE 0.9% FLUSH
3.0000 mL | Freq: Two times a day (BID) | INTRAVENOUS | Status: DC
  Administered 2024-01-07 – 2024-01-09 (×4): 3 mL via INTRAVENOUS

## 2024-01-07 MED ORDER — LIDOCAINE HCL (PF) 1 % IJ SOLN
INTRAMUSCULAR | Status: AC
  Filled 2024-01-07: qty 30

## 2024-01-07 MED ORDER — SODIUM CHLORIDE 0.9 % WEIGHT BASED INFUSION
1.0000 mL/kg/h | INTRAVENOUS | Status: DC
Start: 2024-01-07 — End: 2024-01-07

## 2024-01-07 MED ORDER — MIDAZOLAM HCL 2 MG/2ML IJ SOLN
INTRAMUSCULAR | Status: DC | PRN
  Administered 2024-01-07: 1 mg via INTRAVENOUS

## 2024-01-07 MED ORDER — CLOPIDOGREL BISULFATE 300 MG PO TABS
600.0000 mg | ORAL_TABLET | Freq: Once | ORAL | Status: AC
  Administered 2024-01-07: 600 mg via ORAL

## 2024-01-07 MED ORDER — ACETAMINOPHEN 325 MG PO TABS: 650.0000 mg | ORAL_TABLET | ORAL | Status: DC | PRN

## 2024-01-07 MED ORDER — ONDANSETRON HCL 4 MG/2ML IJ SOLN: 4.0000 mg | Freq: Four times a day (QID) | INTRAMUSCULAR | Status: DC | PRN

## 2024-01-07 MED ORDER — DOFETILIDE 500 MCG PO CAPS
500.0000 ug | ORAL_CAPSULE | Freq: Two times a day (BID) | ORAL | Status: DC
  Administered 2024-01-07 – 2024-01-09 (×3): 500 ug via ORAL
  Filled 2024-01-07 (×3): qty 1

## 2024-01-07 MED ORDER — FINASTERIDE 5 MG PO TABS
5.0000 mg | ORAL_TABLET | Freq: Every day | ORAL | Status: DC
  Administered 2024-01-09: 5 mg via ORAL
  Filled 2024-01-07: qty 1

## 2024-01-07 MED ORDER — VERAPAMIL HCL 2.5 MG/ML IV SOLN
INTRAVENOUS | Status: AC
  Filled 2024-01-07: qty 2

## 2024-01-07 MED ORDER — ONDANSETRON HCL 4 MG/2ML IJ SOLN
INTRAMUSCULAR | Status: DC | PRN
  Administered 2024-01-07: 4 mg via INTRAVENOUS

## 2024-01-07 MED ORDER — MIDAZOLAM HCL 2 MG/2ML IJ SOLN
INTRAMUSCULAR | Status: AC
Start: 2024-01-07 — End: ?
  Filled 2024-01-07: qty 2

## 2024-01-07 MED ORDER — SODIUM CHLORIDE 0.9 % IV SOLN: 250.0000 mL | INTRAVENOUS | Status: DC | PRN

## 2024-01-07 MED ORDER — PANTOPRAZOLE SODIUM 40 MG PO TBEC
40.0000 mg | DELAYED_RELEASE_TABLET | Freq: Every day | ORAL | Status: DC
  Administered 2024-01-07 – 2024-01-09 (×2): 40 mg via ORAL
  Filled 2024-01-07 (×2): qty 1

## 2024-01-07 MED ORDER — POTASSIUM CHLORIDE CRYS ER 20 MEQ PO TBCR
20.0000 meq | EXTENDED_RELEASE_TABLET | Freq: Every day | ORAL | Status: DC
  Administered 2024-01-09: 20 meq via ORAL
  Filled 2024-01-07: qty 1

## 2024-01-07 MED ORDER — ATROPINE SULFATE 1 MG/10ML IJ SOSY
PREFILLED_SYRINGE | INTRAMUSCULAR | Status: AC
  Filled 2024-01-07: qty 10

## 2024-01-07 MED ORDER — IOHEXOL 350 MG/ML SOLN
INTRAVENOUS | Status: DC | PRN
  Administered 2024-01-07: 60 mL

## 2024-01-07 MED ORDER — ONDANSETRON HCL 4 MG/2ML IJ SOLN
INTRAMUSCULAR | Status: AC
  Filled 2024-01-07: qty 2

## 2024-01-07 SURGICAL SUPPLY — 8 items
CATH 5FR JL3.5 JR4 ANG PIG MP (CATHETERS) ×1
DEVICE RAD COMP TR BAND LRG (VASCULAR PRODUCTS) ×1
GLIDESHEATH SLEND SS 6F .021 (SHEATH) ×1
INQWIRE 1.5J .035X260CM (WIRE) ×1 IMPLANT
PACK CARDIAC CATHETERIZATION (CUSTOM PROCEDURE TRAY) ×1
SET ATX-X65L (MISCELLANEOUS) ×1
SHEATH PROBE COVER 6X72 (BAG) ×1
WIRE HI TORQ VERSACORE-J 145CM (WIRE) ×1

## 2024-01-07 NOTE — Progress Notes (Signed)
 PHARMACY - ANTICOAGULATION CONSULT NOTE  Pharmacy Consult for heparin Indication: atrial fibrillation  Allergies  Allergen Reactions   Perflutren Lipid Microsphere Other (See Comments)    Severe lower back, tailbone, and butt pain. Minor chest pain    Patient Measurements: Height: 5\' 8"  (172.7 cm) Weight: 84.6 kg (186 lb 9.6 oz) IBW/kg (Calculated) : 68.4 Heparin Dosing Weight: 84 kg  Vital Signs: Temp: 98.1 F (36.7 C) (02/24 1359) Temp Source: Oral (02/24 1359) BP: 132/69 (02/24 1359) Pulse Rate: 60 (02/24 1359)  Labs: No results for input(s): "HGB", "HCT", "PLT", "APTT", "LABPROT", "INR", "HEPARINUNFRC", "HEPRLOWMOCWT", "CREATININE", "CKTOTAL", "CKMB", "TROPONINIHS" in the last 72 hours.  Estimated Creatinine Clearance: 67.4 mL/min (by C-G formula based on SCr of 0.91 mg/dL).   Medical History: Past Medical History:  Diagnosis Date   Abdominal obesity    Alcohol abuse    Arthritis    Atrial fibrillation (HCC)    Carotid artery occlusion    Carotid bruit    Chest pain    Chondrodermatitis nodularis helicis, left    Coronary artery disease    Dyspnea    GERD (gastroesophageal reflux disease)    HOH (hard of hearing)    Hyperlipidemia    Hypertension    Myocardial infarction (HCC) 1991   Thyroid disease    Vitamin D deficiency      Assessment: 48 yoM with hx AF on apixaban PTA admitted for cath with plans for staged PCI tomorrow. Pharmacy to resume heparin 2h after radial band removed.  Goal of Therapy:  Heparin level 0.3-0.7 units/ml aPTT 66-102 seconds Monitor platelets by anticoagulation protocol: Yes   Plan:  Heparin 1200 units/h no bolus 2h after TR band removed  Fredonia Highland, PharmD, BCPS, [facility] Clinical Pharmacist [phone] Please check AMION for all [facility] Pharmacy numbers 01/07/2024

## 2024-01-07 NOTE — Interval H&P Note (Signed)
 History and Physical Interval Note:  01/07/2024 9:19 AM  Kevin Gonzales  has presented today for surgery, with the diagnosis of chest pain.  The various methods of treatment have been discussed with the patient and family. After consideration of risks, benefits and other options for treatment, the patient has consented to  Procedure(s): LEFT HEART CATH AND CORS/GRAFTS ANGIOGRAPHY (N/A) as a surgical intervention.  The patient's history has been reviewed, patient examined, no change in status, stable for surgery.  I have reviewed the patient's chart and labs.  Questions were answered to the patient's satisfaction.   Cath Lab Visit (complete for each Cath Lab visit)  Clinical Evaluation Leading to the Procedure:   ACS: No.  Non-ACS:    Anginal Classification: CCS III  Anti-ischemic medical therapy: No Therapy  Non-Invasive Test Results: No non-invasive testing performed  Prior CABG: Previous CABG        Theron Arista [facility] 01/07/2024 9:19 AM

## 2024-01-08 ENCOUNTER — Other Ambulatory Visit: Payer: Self-pay

## 2024-01-08 ENCOUNTER — Encounter (HOSPITAL_COMMUNITY): Payer: Self-pay | Admitting: Cardiovascular Disease

## 2024-01-08 ENCOUNTER — Encounter (HOSPITAL_COMMUNITY)
Admission: AD | Disposition: A | Discharge status: Home / Self Care | Payer: Self-pay | Source: Ambulatory Visit | Attending: Cardiology

## 2024-01-08 DIAGNOSIS — I480 Paroxysmal atrial fibrillation: Secondary | ICD-10-CM

## 2024-01-08 DIAGNOSIS — E785 Hyperlipidemia, unspecified: Secondary | ICD-10-CM

## 2024-01-08 DIAGNOSIS — R0609 Other forms of dyspnea: Secondary | ICD-10-CM

## 2024-01-08 DIAGNOSIS — I25110 Atherosclerotic heart disease of native coronary artery with unstable angina pectoris: Secondary | ICD-10-CM

## 2024-01-08 HISTORY — PX: CORONARY ATHERECTOMY: CATH118238

## 2024-01-08 HISTORY — PX: CORONARY STENT INTERVENTION: CATH118234

## 2024-01-08 LAB — CBC
HCT: 36.8 % — ABNORMAL LOW (ref 39.0–52.0)
Hemoglobin: 12.2 g/dL — ABNORMAL LOW (ref 13.0–17.0)
MCH: 31.9 pg (ref 26.0–34.0)
MCHC: 33.2 g/dL (ref 30.0–36.0)
MCV: 96.3 fL (ref 80.0–100.0)
Platelets: 195 10*3/uL (ref 150–400)
RBC: 3.82 MIL/uL — ABNORMAL LOW (ref 4.22–5.81)
RDW: 11.9 % (ref 11.5–15.5)
WBC: 7.2 10*3/uL (ref 4.0–10.5)
nRBC: 0 % (ref 0.0–0.2)

## 2024-01-08 LAB — HEPARIN LEVEL (UNFRACTIONATED): Heparin Unfractionated: 0.6 [IU]/mL (ref 0.30–0.70)

## 2024-01-08 LAB — BASIC METABOLIC PANEL
Anion gap: 6 (ref 5–15)
BUN: 22 mg/dL (ref 8–23)
CO2: 25 mmol/L (ref 22–32)
Calcium: 8.5 mg/dL — ABNORMAL LOW (ref 8.9–10.3)
Chloride: 106 mmol/L (ref 98–111)
Creatinine, Ser: 1.01 mg/dL (ref 0.61–1.24)
GFR, Estimated: >60 mL/min (ref >60)
Glucose, Bld: 104 mg/dL — ABNORMAL HIGH (ref 70–99)
Potassium: 3.9 mmol/L (ref 3.5–5.1)
Sodium: 137 mmol/L (ref 135–145)

## 2024-01-08 LAB — MAGNESIUM: Magnesium: 2 mg/dL (ref 1.7–2.4)

## 2024-01-08 SURGERY — CORONARY ATHERECTOMY
Anesthesia: LOCAL

## 2024-01-08 MED ORDER — ONDANSETRON HCL 4 MG/2ML IJ SOLN: 4.0000 mg | Freq: Four times a day (QID) | INTRAMUSCULAR | Status: DC | PRN

## 2024-01-08 MED ORDER — IOHEXOL 350 MG/ML SOLN
INTRAVENOUS | Status: DC | PRN
  Administered 2024-01-08: 135 mL

## 2024-01-08 MED ORDER — ACETAMINOPHEN 325 MG PO TABS: 650.0000 mg | ORAL_TABLET | ORAL | Status: DC | PRN

## 2024-01-08 MED ORDER — SODIUM CHLORIDE 0.9 % IV SOLN: 250.0000 mL | INTRAVENOUS | Status: AC | PRN

## 2024-01-08 MED ORDER — FENTANYL CITRATE (PF) 100 MCG/2ML IJ SOLN
INTRAMUSCULAR | Status: DC | PRN
  Administered 2024-01-08: 25 ug via INTRAVENOUS
  Administered 2024-01-08: 50 ug via INTRAVENOUS
  Administered 2024-01-08: 25 ug via INTRAVENOUS

## 2024-01-08 MED ORDER — HYDRALAZINE HCL 20 MG/ML IJ SOLN: 10.0000 mg | INTRAMUSCULAR | Status: AC | PRN

## 2024-01-08 MED ORDER — ATROPINE SULFATE 1 MG/10ML IJ SOSY
PREFILLED_SYRINGE | INTRAMUSCULAR | Status: DC | PRN
Start: 2024-01-08 — End: 2024-01-08

## 2024-01-08 MED ORDER — SODIUM CHLORIDE 0.9 % IV SOLN
INTRAVENOUS | Status: AC | PRN
  Administered 2024-01-08: 500 mL via INTRAVENOUS

## 2024-01-08 MED ORDER — NITROGLYCERIN 1 MG/10 ML FOR IR/CATH LAB
INTRA_ARTERIAL | Status: DC | PRN
  Administered 2024-01-08: 200 ug via INTRACORONARY
  Administered 2024-01-08: 150 ug via INTRACORONARY

## 2024-01-08 MED ORDER — HEPARIN (PORCINE) IN NACL 1000-0.9 UT/500ML-% IV SOLN
INTRAVENOUS | Status: DC | PRN
  Administered 2024-01-08 (×2): 500 mL

## 2024-01-08 MED ORDER — POTASSIUM CHLORIDE CRYS ER 20 MEQ PO TBCR: 20.0000 meq | EXTENDED_RELEASE_TABLET | Freq: Once | ORAL | Status: DC

## 2024-01-08 MED ORDER — SODIUM CHLORIDE 0.9% FLUSH: 3.0000 mL | INTRAVENOUS | Status: DC | PRN

## 2024-01-08 MED ORDER — LABETALOL HCL 5 MG/ML IV SOLN: 10.0000 mg | INTRAVENOUS | Status: AC | PRN

## 2024-01-08 MED ORDER — MIDAZOLAM HCL 2 MG/2ML IJ SOLN
INTRAMUSCULAR | Status: DC | PRN
  Administered 2024-01-08: 1 mg via INTRAVENOUS
  Administered 2024-01-08: 2 mg via INTRAVENOUS
  Administered 2024-01-08: 1 mg via INTRAVENOUS

## 2024-01-08 MED ORDER — SODIUM CHLORIDE 0.9 % WEIGHT BASED INFUSION
1.0000 mL/kg/h | INTRAVENOUS | Status: DC
  Administered 2024-01-08: 1 mL/kg/h via INTRAVENOUS

## 2024-01-08 MED ORDER — FENTANYL CITRATE (PF) 100 MCG/2ML IJ SOLN
INTRAMUSCULAR | Status: AC
  Filled 2024-01-08: qty 2

## 2024-01-08 MED ORDER — MIDAZOLAM HCL 2 MG/2ML IJ SOLN
INTRAMUSCULAR | Status: AC
  Filled 2024-01-08: qty 2

## 2024-01-08 MED ORDER — APIXABAN 5 MG PO TABS: 5.0000 mg | ORAL_TABLET | Freq: Two times a day (BID) | ORAL | Status: DC

## 2024-01-08 MED ORDER — SODIUM CHLORIDE 0.9 % IV SOLN: INTRAVENOUS | Status: AC

## 2024-01-08 MED ORDER — SODIUM CHLORIDE 0.9% FLUSH: 3.0000 mL | Freq: Two times a day (BID) | INTRAVENOUS | Status: DC

## 2024-01-08 MED ORDER — LIDOCAINE HCL (PF) 1 % IJ SOLN
INTRAMUSCULAR | Status: DC | PRN
  Administered 2024-01-08: 10 mL via INTRADERMAL

## 2024-01-08 MED ORDER — NITROGLYCERIN 1 MG/10 ML FOR IR/CATH LAB
INTRA_ARTERIAL | Status: AC
  Filled 2024-01-08: qty 10

## 2024-01-08 MED ORDER — LIDOCAINE HCL (PF) 1 % IJ SOLN
INTRAMUSCULAR | Status: AC
  Filled 2024-01-08: qty 30

## 2024-01-08 MED ORDER — SODIUM CHLORIDE 0.9 % WEIGHT BASED INFUSION
3.0000 mL/kg/h | INTRAVENOUS | Status: AC
Start: 2024-01-08 — End: 2024-01-08
  Administered 2024-01-08: 3 mL/kg/h via INTRAVENOUS

## 2024-01-08 MED ORDER — HEPARIN SODIUM (PORCINE) 1000 UNIT/ML IJ SOLN
INTRAMUSCULAR | Status: DC | PRN
Start: 2024-01-08 — End: 2024-01-08
  Administered 2024-01-08: 8000 [IU] via INTRAVENOUS

## 2024-01-08 SURGICAL SUPPLY — 30 items
BALL SAPPHIRE NC24 3.0X12 (BALLOONS) ×1 IMPLANT
BALLN SAPPHIRE 2.0X12 (BALLOONS) ×1 IMPLANT
BALLN SAPPHIRE 2.5X15 (BALLOONS) ×1 IMPLANT
BALLOON SAPPHIRE 2.0X12 (BALLOONS) IMPLANT
BALLOON SAPPHIRE 2.5X15 (BALLOONS) IMPLANT
BALLOON SAPPHIRE NC24 3.0X12 (BALLOONS) IMPLANT
CATH LAUNCHER 6FR EBU3.5 (CATHETERS) IMPLANT
CATH TELEPORT (CATHETERS) IMPLANT
CATH VISTA GUIDE 6FR XBLD 3.5 (CATHETERS) IMPLANT
CLOSURE PERCLOSE PROSTYLE (VASCULAR PRODUCTS) IMPLANT
CROWN DIAMONDBACK CLASSIC 1.25 (BURR) IMPLANT
GLIDESHEATH SLEND SS 6F .021 (SHEATH) IMPLANT
GUIDEWIRE INQWIRE 1.5J.035X260 (WIRE) IMPLANT
INQWIRE 1.5J .035X260CM (WIRE) ×1 IMPLANT
KIT ENCORE 26 ADVANTAGE (KITS) IMPLANT
LUBRICANT VIPERSLIDE CORONARY (MISCELLANEOUS) IMPLANT
PACK CARDIAC CATHETERIZATION (CUSTOM PROCEDURE TRAY) ×1 IMPLANT
PINNACLE LONG 6F 25CM (SHEATH) ×1 IMPLANT
SET ATX-X65L (MISCELLANEOUS) IMPLANT
SHEATH GLIDE SLENDER 4/5FR (SHEATH) IMPLANT
SHEATH INTRO PINNACLE 6F 25CM (SHEATH) IMPLANT
SHEATH PINNACLE 6F 10CM (SHEATH) IMPLANT
SHEATH PROBE COVER 6X72 (BAG) IMPLANT
STENT SYNERGY XD 2.75X16 (Permanent Stent) IMPLANT
SYNERGY XD 2.75X16 (Permanent Stent) ×1 IMPLANT
TUBING CIL FLEX 10 FLL-RA (TUBING) IMPLANT
WIRE ASAHI PROWATER 300CM (WIRE) IMPLANT
WIRE HI TORQ WHISPER MS 300CM (WIRE) IMPLANT
WIRE MICRO SET SILHO 5FR 7 (SHEATH) IMPLANT
WIRE VIPERWIRE COR FLEX .012 (WIRE) IMPLANT

## 2024-01-08 NOTE — Progress Notes (Signed)
 Called for right groin hematoma. Pressure was held for 5 minutes prior to my arrival. 3inch by 3 inch round hematoma over the bladder medial to right femoral access site. Bruising was present from the hematoma to the femoral head lateral to the stick site. Manual pressure applied for 40 minutes. Dr. Herbie Baltimore present during groin hold.  Site level 2, hematoma softer ,site marked with marker.  Per Dr. Herbie Baltimore, a pressure tape and  gauze dressing was applied over the tegaderm site.  Dp and pt pulses palpable.   Bedrest restarts at 15:00:00

## 2024-01-08 NOTE — Plan of Care (Signed)
  Problem: Education: Goal: Understanding of CV disease, CV risk reduction, and recovery process will improve Outcome: Progressing Goal: Individualized Educational Video(s) Outcome: Progressing   Problem: Activity: Goal: Ability to return to baseline activity level will improve Outcome: Progressing   

## 2024-01-08 NOTE — Progress Notes (Signed)
 PHARMACY - ANTICOAGULATION CONSULT NOTE  Pharmacy Consult for heparin Indication: atrial fibrillation  Allergies  Allergen Reactions   Perflutren Lipid Microsphere Other (See Comments)    Severe lower back, tailbone, and butt pain. Minor chest pain    Patient Measurements: Height: 5\' 8"  (172.7 cm) Weight: 84.6 kg (186 lb 9.6 oz) IBW/kg (Calculated) : 68.4 Heparin Dosing Weight: 84 kg  Vital Signs: Temp: 98.3 F (36.8 C) (02/25 0455) Temp Source: Oral (02/25 0455) BP: 130/65 (02/25 0455) Pulse Rate: 60 (02/25 0455)  Labs: Recent Labs    01/08/24 0430  HGB 12.2*  HCT 36.8*  PLT 195  HEPARINUNFRC 0.60  CREATININE 1.01    Estimated Creatinine Clearance: 60.8 mL/min (by C-G formula based on SCr of 1.01 mg/dL).   Medical History: Past Medical History:  Diagnosis Date   Abdominal obesity    Alcohol abuse    Arthritis    Atrial fibrillation (HCC)    Carotid artery occlusion    Carotid bruit    Chest pain    Chondrodermatitis nodularis helicis, left    Coronary artery disease    Dyspnea    GERD (gastroesophageal reflux disease)    HOH (hard of hearing)    Hyperlipidemia    Hypertension    Myocardial infarction (HCC) 1991   Thyroid disease    Vitamin D deficiency      Assessment: 62 yoM with hx AF on apixaban PTA admitted for cath with plans for staged PCI tomorrow. Pharmacy to resume heparin 2h after radial band removed.  2/25 AM update:  Heparin level therapeutic  Goal of Therapy:  Heparin level 0.3-0.7 units/mL Monitor platelets by anticoagulation protocol: Yes   Plan:  Cont heparin 1200 units/hr Heparin level in 8 hours Possible PCI today   Abran Duke, PharmD, BCPS Clinical Pharmacist Phone: [phone]

## 2024-01-08 NOTE — Interval H&P Note (Signed)
 History and Physical Interval Note:  01/08/2024 9:42 AM  Kevin Gonzales  has presented today for surgery, with the diagnosis of CAD.  The various methods of treatment have been discussed with the patient and family. After consideration of risks, benefits and other options for treatment, the patient has consented to  Procedure(s): CORONARY ATHERECTOMY (N/A) as a surgical intervention.  The patient's history has been reviewed, patient examined, no change in status, stable for surgery.  I have reviewed the patient's chart and labs.  Questions were answered to the patient's satisfaction.    Pt with severe diffuse ostial and proximal circumflex disease with heavy calcification. As per Dr Elvis Coil cath note, plan orbital atherectomy and stenting of the circumflex via femoral access. Explained procedure, risks, alternatives, potential benefit to the patient. He agrees to proceed.   Tonny Bollman

## 2024-01-08 NOTE — Progress Notes (Signed)
 Patient Name: Kevin Gonzales Date of Encounter: 01/08/2024 [facility] Cardiologist: Kevin Batty, MD   Patient Profile   83 year old gentleman referred by Kevin Arnt, PA from [facility] cardiology for evaluation of ongoing pain and recommendation of cardiac catheterization.  History of CAD-CABG x 1 (pedicle RIMA-RCA) in 1991 followed by proximal LAD PCI in April 2005), carotid artery disease-s/p R CEA, PA (chemically cardioverted with an sinus rhythm maintained on Tikosyn, on Eliquis for DOAC), HTN, HLD referred by Dr. Gery Gonzales for new onset dyspnea and chest pain concerning for class II-III angina. => Initial catheter revealed patent LAD stent, RIMA-RCA graft but with severe ostial proximal calcified LCx disease, admitted overnight for staged atherectomy PCI of the LCx on 01/08/2024.  Interval Summary  .    Patient underwent atherectomy PCI of the LCx today with Dr. Excell Gonzales.  I personally reviewed the films and discussed results with Dr. Excell Gonzales.  Excellent result with atherectomy and PCI of the ostial LCx as reviewed below.  Procedure was done via femoral access and closure device used, however unfortunately he developed a hematoma upon arrival to the floor with a pubic mons ecchymosis and firm hematoma tracking using from the sheath access site. Aggressive manual pressure held for hemostasis on reducing most of the bruising and bleeding.  Has not had any further chest pain since being in the hospital, and other than soreness with pressure being held in his groin, he is doing well.  Looking forward to being able to get up.  However he is currently on bedrest.   Assessment & Plan .     83 year old male with past medical history of CAD s/p CABG '91, hypertension, hyperlipidemia, remote smoker, PVD status post R CEA '19, paroxysmal atrial fibrillation who was seen in the clinic on 2/11 with Dr. Gery Gonzales and reported increasing dyspnea and chest pain.  He was set up for  outpatient cardiac catheterization.  Chest pain CAD s/p CABG '91 -- Underwent cardiac catheterization 2/24 with patent RIMA to RCA, patent stent in proximal circumflex with diffuse nonobstructive disease in proximal/mid LAD, 95% proximal/mid left circumflex felt to be culprit lesion.  Due to angulation from left main and severe calcification it was felt he would need to undergo atherectomy.  He was placed on Plavix, IV heparin with plans for staged PCI today -- Continue IV heparin, aspirin, Plavix, pravastatin 40 mg daily -Patient seen initially precath as well as post-cath with the concerned about hematoma.  Procedure went well however hematoma was more than expected on the floor.  Now stable.  Will monitor overnight to ensure you stable.  Would not start Eliquis in the morning until we know you are safe.  Paroxysmal atrial fibrillation -- Sinus rhythm on Tikosyn => continue Tikosyn-but not on any beta-blocker or calcium channel blocker for additional rate control. -- Eliquis on hold with need for invasive procedures -- Maintain on IV heparin overnight, and discontinued for cardiac cath. --Will hold off on restarting Eliquis until he is evaluated in the morning post cath to ensure no bleeding.  Hypertension -- Controlled with some mild labile swings. -Not on any BP medications other than 100 mg losartan.  Hyperlipidemia -- On pravastatin 40 mg daily we will simply continue home dose, but would like to target LDL less than 70-most recent outside LDL was 70.  May want to consider more potent statin.  Will defer to primary cardiologist.  Vital Signs .    Vitals:   01/08/24 1107 01/08/24 1151  01/08/24 1632 01/08/24 1945  BP: 135/66 (!) 167/75 139/73 124/68  Pulse: 75 92 81 89  Resp: 11 15 16 18   Temp:  97.6 F (36.4 C) 98.2 F (36.8 C) 98.2 F (36.8 C)  TempSrc:  Oral Oral Oral  SpO2: 93% 97% 92% 95%  Weight:      Height:        Intake/Output Summary (Last 24 hours) at 01/08/2024  2243 Last data filed at 01/08/2024 1632 Gross per 24 hour  Intake 76.07 ml  Output 250 ml  Net -173.93 ml      01/08/2024    5:08 AM 01/07/2024   12:41 PM 01/07/2024    7:51 AM  Last 3 Weights  Weight (lbs) 187 lb 1.6 oz 186 lb 9.6 oz 190 lb  Weight (kg) 84.868 kg 84.641 kg 86.183 kg     Physical Exam .    GEN: No acute distress.   Neck: No JVD Cardiac: RRR, no murmurs, rubs, or gallops.  Respiratory: Clear to auscultation bilaterally. GI: Soft, nontender, non-distended  MS: Right groin has a firm area of swelling medial to the access site in the mons with a focal area of ecchymosis as well as ecchymosis around the radial access site.  Pressure being held on my examination and I personally held pressure on the myometrial site with pretty significant reduction of swelling and tenderness.  Pressure dressing applied after she told   Telemetry/ECG/Cardiology Studies:    Telemetry: Sinus rhythm- Personally Reviewed  EKG (01/08/2024): Sinus rhythm with PACs.  Nonspecific ST and T wave abnormality.  LCP-Cor/Graft Angiography (01/07/2024): Critical LCx stenosis as the culprit lesion-proximal to mid LCx 95% represent healthy calcified stenosis (culprit lesion); ostial LM 30%, proximal LAD 30%.  Proximal to mid LAD 50%.  OM1 90 %..  Proximal mid RCA 100% CTO--known occlusion.  Pedicle-RIMA-RCA widely patent.  Normal LV function and normal EDP.  (Dr. [place]) Staged atherectomy PCI of LCx 01/08/2024: Successful orbital atherectomy and stenting of critical 99% calcific stenosis in the proximal left circumflex, lesion stented with a 2.75 x 16 mm Synergy DES  81 mg ASA x 30 days, Plavix 6 months; resume apixaban twice daily in the 226.5     For questions or updates, please contact [facility] Please consult [email or web address] for contact info under        Signed, Kevin Lemma, MD

## 2024-01-08 NOTE — Plan of Care (Signed)
  Problem: Activity: Goal: Ability to return to baseline activity level will improve Outcome: Progressing   

## 2024-01-08 NOTE — Progress Notes (Signed)
 RN assisted pt with the urinal. Pt remained on bedrest, no log roll performed. Large hematoma found on R groin. Last checked at 1230 level 0. Charge RN started holding pressure at 1405, cath lab called and took over holding pressure at ~1411.

## 2024-01-08 NOTE — Plan of Care (Signed)
  Problem: Education: Goal: Understanding of CV disease, CV risk reduction, and recovery process will improve 01/08/2024 2128 by Dahlia Bailiff, RN Outcome: Progressing 01/08/2024 2034 by Dahlia Bailiff, RN Outcome: Progressing Goal: Individualized Educational Video(s) 01/08/2024 2128 by Dahlia Bailiff, RN Outcome: Progressing 01/08/2024 2034 by Dahlia Bailiff, RN Outcome: Progressing   Problem: Activity: Goal: Ability to return to baseline activity level will improve 01/08/2024 2128 by Dahlia Bailiff, RN Outcome: Progressing 01/08/2024 2034 by Dahlia Bailiff, RN Outcome: Progressing

## 2024-01-09 ENCOUNTER — Encounter (HOSPITAL_COMMUNITY): Payer: Self-pay | Admitting: Cardiovascular Disease

## 2024-01-09 ENCOUNTER — Other Ambulatory Visit (HOSPITAL_COMMUNITY): Payer: Self-pay

## 2024-01-09 ENCOUNTER — Telehealth (HOSPITAL_COMMUNITY): Payer: Self-pay | Admitting: Pharmacy Technician

## 2024-01-09 ENCOUNTER — Other Ambulatory Visit: Payer: Self-pay | Admitting: Cardiology

## 2024-01-09 DIAGNOSIS — I4819 Other persistent atrial fibrillation: Secondary | ICD-10-CM

## 2024-01-09 DIAGNOSIS — I10 Essential (primary) hypertension: Secondary | ICD-10-CM

## 2024-01-09 DIAGNOSIS — I97630 Postprocedural hematoma of a circulatory system organ or structure following a cardiac catheterization: Secondary | ICD-10-CM

## 2024-01-09 DIAGNOSIS — I480 Paroxysmal atrial fibrillation: Secondary | ICD-10-CM | POA: Diagnosis not present

## 2024-01-09 DIAGNOSIS — E785 Hyperlipidemia, unspecified: Secondary | ICD-10-CM | POA: Diagnosis not present

## 2024-01-09 DIAGNOSIS — I25110 Atherosclerotic heart disease of native coronary artery with unstable angina pectoris: Secondary | ICD-10-CM | POA: Diagnosis not present

## 2024-01-09 LAB — BASIC METABOLIC PANEL
Anion gap: 12 (ref 5–15)
BUN: 18 mg/dL (ref 8–23)
CO2: 24 mmol/L (ref 22–32)
Calcium: 8.8 mg/dL — ABNORMAL LOW (ref 8.9–10.3)
Chloride: 104 mmol/L (ref 98–111)
Creatinine, Ser: 0.93 mg/dL (ref 0.61–1.24)
GFR, Estimated: >60 mL/min (ref >60)
Glucose, Bld: 110 mg/dL — ABNORMAL HIGH (ref 70–99)
Potassium: 3.8 mmol/L (ref 3.5–5.1)
Sodium: 140 mmol/L (ref 135–145)

## 2024-01-09 LAB — CBC
HCT: 36.6 % — ABNORMAL LOW (ref 39.0–52.0)
Hemoglobin: 12.2 g/dL — ABNORMAL LOW (ref 13.0–17.0)
MCH: 31.6 pg (ref 26.0–34.0)
MCHC: 33.3 g/dL (ref 30.0–36.0)
MCV: 94.8 fL (ref 80.0–100.0)
Platelets: 209 10*3/uL (ref 150–400)
RBC: 3.86 MIL/uL — ABNORMAL LOW (ref 4.22–5.81)
RDW: 11.9 % (ref 11.5–15.5)
WBC: 9 10*3/uL (ref 4.0–10.5)
nRBC: 0 % (ref 0.0–0.2)

## 2024-01-09 LAB — LIPOPROTEIN A (LPA): Lipoprotein (a): 67 nmol/L — ABNORMAL HIGH (ref <75.0)

## 2024-01-09 LAB — POCT ACTIVATED CLOTTING TIME
Activated Clotting Time: 297 s
Activated Clotting Time: 279 s

## 2024-01-09 LAB — MAGNESIUM: Magnesium: 2 mg/dL (ref 1.7–2.4)

## 2024-01-09 MED ORDER — CLOPIDOGREL BISULFATE 75 MG PO TABS
75.0000 mg | ORAL_TABLET | Freq: Every day | ORAL | 5 refills | Status: DC
  Filled 2024-01-09: qty 30, 30d supply, fill #0

## 2024-01-09 MED ORDER — APIXABAN 5 MG PO TABS
5.0000 mg | ORAL_TABLET | Freq: Two times a day (BID) | ORAL | Status: DC
  Administered 2024-01-09: 5 mg via ORAL
  Filled 2024-01-09: qty 1

## 2024-01-09 MED ORDER — METOPROLOL TARTRATE 25 MG PO TABS
25.0000 mg | ORAL_TABLET | Freq: Two times a day (BID) | ORAL | 1 refills | Status: DC
  Filled 2024-01-09: qty 60, 30d supply, fill #0

## 2024-01-09 MED ORDER — VIPERSLIDE LUBRICANT OPTIME: TOPICAL | Status: DC | PRN

## 2024-01-09 MED ORDER — PANTOPRAZOLE SODIUM 40 MG PO TBEC
40.0000 mg | DELAYED_RELEASE_TABLET | Freq: Every day | ORAL | 3 refills | Status: DC
  Filled 2024-01-09: qty 30, 30d supply, fill #0

## 2024-01-09 MED ORDER — METOPROLOL TARTRATE 25 MG PO TABS
25.0000 mg | ORAL_TABLET | Freq: Two times a day (BID) | ORAL | Status: DC
  Administered 2024-01-09: 25 mg via ORAL
  Filled 2024-01-09: qty 1

## 2024-01-09 NOTE — TOC CM/SW Note (Signed)
 Transition of Care [facility]) - Inpatient Brief Assessment   Patient Details  Name: Kevin SILVESTRO MRN: [id number] Date of Birth: [birth year]  [facility] CM/SW Contact:    Gala Lewandowsky, RN Phone Number: 01/09/2024, 3:17 PM   Clinical Narrative: Patient presented for chest pain. PTA patient was from home alone. Patient states he has a cane and rolling walker. Benefits check completed for Eliquis. No home needs identified.     Transition of Care Asessment: Insurance and Status: Insurance coverage has been reviewed Patient has primary care physician: Yes Home environment has been reviewed: reviewed Prior level of function:: independent with cane Prior/Current Home Services: No current home services Social Drivers of Health Review: SDOH reviewed no interventions necessary Readmission risk has been reviewed: Yes Transition of care needs: no transition of care needs at this time

## 2024-01-09 NOTE — Progress Notes (Signed)
 Pt sts he recently walked with staff. Sts he tolerated well, no major c/o, HR did not exceed 120. His family friend is present. Discussed with pt and friend stent, Plavix importance, restrictions, diet, exercise, NTG and CRPII. Pt receptive although he sts he is too old for much exercise. Will refer to [facility]. Encouraged more walking later with staff if no d/c. His family friend is concerned for him going home today alone. [phone] Ethelda Chick BS, ACSM-CEP 01/09/2024 11:38 AM

## 2024-01-09 NOTE — Telephone Encounter (Signed)
 Patient Product/process development scientist completed.    The patient is insured through [facility]. Patient has [employer], may use a copay card, and/or apply for patient assistance if available.    Ran test claim for Eliquis 5 mg and the current 30 day co-pay is $50.00.   This test claim was processed through [facility]- copay amounts may vary at other pharmacies due to pharmacy/plan contracts, or as the patient moves through the different stages of their insurance plan.     Roland Brigg, CPHT Pharmacy Technician III Certified Patient Advocate [facility] Patient Advocate Team Direct Number: [phone]  Fax: [phone]

## 2024-01-09 NOTE — Discharge Summary (Addendum)
 Discharge Summary    Patient ID: Kevin Gonzales MRN: [id number]; DOB: 1941-11-01  Admit date: 01/07/2024 Discharge date: 01/09/2024  PCP:  Karle Plumber, MD   [facility] Providers Cardiologist:  Nanetta Batty, MD  Electrophysiologist:  Lewayne Bunting, MD  {  Discharge Diagnoses    Principal Problem:   Unstable angina [facility]) Active Problems:   Coronary artery disease   Hyperlipidemia with target LDL less than 70   Essential hypertension   PAF (paroxysmal atrial fibrillation) (HCC)   Dyspnea on exertion   Hematoma of circulatory system after cardiac catheterization  Diagnostic Studies/Procedures    Cardiac catheterization 01/07/2024   Ost LM lesion is 30% stenosed.   Prox LAD lesion is 30% stenosed.   Prox LAD to Mid LAD lesion is 50% stenosed.   Prox Cx to Mid Cx lesion is 95% stenosed.   1st Mrg lesion is 90% stenosed.   Prox RCA to Mid RCA lesion is 100% stenosed.   RIMA graft was visualized by angiography and is large.   The graft exhibits no disease.   The left ventricular systolic function is normal.   LV end diastolic pressure is normal.   The left ventricular ejection fraction is 55-65% by visual estimate.   CAD with critical LCx disease. This appears to be the culprit Patent stent in the proximal LAD. There is moderate diffuse nonobstructive disease in the proximal to mid LAD Occluded RCA. Widely patent RIMA to the RCA Normal LV function Normal LVEDP   Plan; would consider PCI of the LCx. Due to angulation from the left main and severe calcification this is a potentially difficult PCI. I believe it would need atherectomy. Given unstable symptoms will admit to telemetry. Initiate Plavix therapy. IV heparin overnight. Will discuss with interventional colleagues with plan to post for PCI tomorrow.  I would avoid radial access in the future  Coronary atherectomy 01/08/2024 Successful orbital atherectomy and stenting of critical 99% calcific stenosis in  the proximal left circumflex, lesion stented with a 2.75 x 16 mm Synergy DES   Recommendations: Aspirin 81 mg daily x 30 days Clopidogrel 75 mg daily times at least 6 months Resume apixaban 5 mg twice daily tomorrow _____________   History of Present Illness     Kevin Gonzales is a 83 y.o. 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 He was sent direct from [facility] cardiology for evaluation of ongoing chest pain and plans for cardiac catheterization.    Hospital Course     Upon arrival to [facility] on 01/07/2024, patient was taken to cath lab where he underwent cardiac catheterization. It showed CAD with critical left circumflex that appeared to be the culprit lesion. Due to severe calcification it was determined that an atherectomy was necessary for management.   On 01/08/2024 he underwent successful orbital atherectomy and stenting of critical 99% calcific stenosis in the proximal left circumflex, lesion stented with a 2.75 x 16 mm Synergy DES. Recommendations including: ASA 81 mg x 30 days + Plavix 75 mg daily for at least 6 months with restarting Eliquis 5 mg BID.  Patients procedure was completed via femoral access. He developed a hematoma upon arrival to the floor. He was treated with aggressive manual pressure for 40+ minutes then pressure tape and gauze dressing were applied. Distal pulses were palpable.   On 01/09/2024 his groin hematoma was significantly  reduced and soft compared to day prior. He continued to have diffuse bruising around the scrotal and inguinal areas. He had a similar response on his right forearm. His Eliquis was resumed morning of 2/26, will need to follow up with CBC next week and repeat in 2 weeks at follow up. Will also provide strict instructions of when to return for further evaluation; worsening pain, swelling, redness in areas where hematomas were  located.   Patient came to hospital in sinus rhythm. Afternoon of 2/25 patient appeared to re-enter atrial fibrillation with HR from 110-150s. Patient was continued on Tikosyn 500 mcg BID and started Lopressor 25 mg BID for further rate-control. HR maintained controlled with rates in the 60s-80s. He was still in atrial fibrillation most of the time but had episodes   Due to him being asymptomatic opted out of TEE cardioversion and plan to continue his Eliquis and consider need for cardioversion in 2-3 weeks if still in atrial fibrillation. In regards to his hypertension continue losartan 100 mg daily along with newly added Lopressor 25 mg BID, discussed possibly needing to titrate up on statin, currently on pravastatin 40 mg daily, but will defer to primary cardiologist. He will continue ASA 81 mg x 30 days (with low threshold to stop if bleeding recurs), and Plavix 75 mg for at least 6 months.  Patient was seen and examined today and deemed medically stable for discharge per MD.       Did the patient have an acute coronary syndrome (MI, NSTEMI, STEMI, etc) this admission?:  No                               Did the patient have a percutaneous coronary intervention (stent / angioplasty)?:  Yes.    Cath/PCI Registry Performance & Quality Measures: Aspirin prescribed? - Yes ADP Receptor Inhibitor (Plavix/Clopidogrel, Brilinta/Ticagrelor or Effient/Prasugrel) prescribed (includes medically managed patients)? - Yes High Intensity Statin (Lipitor 40-80mg  or Crestor 20-40mg ) prescribed? - No - currently on pravastatin 40 mg, defer changes to primary cardiologist For EF <40%, was ACEI/ARB prescribed? - Not Applicable (EF >/= 40%) For EF <40%, Aldosterone Antagonist (Spironolactone or Eplerenone) prescribed? - Not Applicable (EF >/= 40%) Cardiac Rehab Phase II ordered? - Yes   ____________  Discharge Vitals Blood pressure (!) 110/57, pulse 76, temperature 97.8 F (36.6 C), temperature source Oral,  resp. rate 17, height 5\' 8"  (1.727 m), weight 84.9 kg, SpO2 92%.  Filed Weights   01/07/24 0751 01/07/24 1241 01/08/24 0508  Weight: 86.2 kg 84.6 kg 84.9 kg   Labs & Radiologic Studies    CBC Recent Labs    01/08/24 0430 01/09/24 0753  WBC 7.2 9.0  HGB 12.2* 12.2*  HCT 36.8* 36.6*  MCV 96.3 94.8  PLT 195 209   Basic Metabolic Panel Recent Labs    96/04/54 0430 01/09/24 0753  NA 137 140  K 3.9 3.8  CL 106 104  CO2 25 24  GLUCOSE 104* 110*  BUN 22 18  CREATININE 1.01 0.93  CALCIUM 8.5* 8.8*  MG 2.0 2.0   Liver Function Tests No results for input(s): "AST", "ALT", "ALKPHOS", "BILITOT", "PROT", "ALBUMIN" in the last 72 hours. No results for input(s): "LIPASE", "AMYLASE" in the last 72 hours. High Sensitivity Troponin:   No results for input(s): "TROPONINIHS" in the last 720 hours.  BNP Invalid input(s): "POCBNP" D-Dimer No results for input(s): "DDIMER" in the last 72 hours. Hemoglobin A1C No  results for input(s): "HGBA1C" in the last 72 hours. Fasting Lipid Panel No results for input(s): "CHOL", "HDL", "LDLCALC", "TRIG", "CHOLHDL", "LDLDIRECT" in the last 72 hours. Thyroid Function Tests No results for input(s): "TSH", "T4TOTAL", "T3FREE", "THYROIDAB" in the last 72 hours.  Invalid input(s): "FREET3" _____________  CARDIAC CATHETERIZATION Result Date: 01/08/2024 Successful orbital atherectomy and stenting of critical 99% calcific stenosis in the proximal left circumflex, lesion stented with a 2.75 x 16 mm Synergy DES Recommendations: Aspirin 81 mg daily x 30 days Clopidogrel 75 mg daily times at least 6 months Resume apixaban 5 mg twice daily tomorrow   CARDIAC CATHETERIZATION Addendum Date: 01/07/2024   Kevin Gonzales LM lesion is 30% stenosed.   Prox LAD lesion is 30% stenosed.   Prox LAD to Mid LAD lesion is 50% stenosed.   Prox Cx to Mid Cx lesion is 95% stenosed.   1st Mrg lesion is 90% stenosed.   Prox RCA to Mid RCA lesion is 100% stenosed.   RIMA graft was visualized  by angiography and is large.   The graft exhibits no disease.   The left ventricular systolic function is normal.   LV end diastolic pressure is normal.   The left ventricular ejection fraction is 55-65% by visual estimate. CAD with critical LCx disease. This appears to be the culprit Patent stent in the proximal LAD. There is moderate diffuse nonobstructive disease in the proximal to mid LAD Occluded RCA. Widely patent RIMA to the RCA Normal LV function Normal LVEDP Plan; would consider PCI of the LCx. Due to angulation from the left main and severe calcification this is a potentially difficult PCI. I believe it would need atherectomy. Given unstable symptoms will admit to telemetry. Initiate Plavix therapy. IV heparin overnight. Will discuss with interventional colleagues with plan to post for PCI tomorrow.  I would avoid radial access in the future   Result Date: 01/07/2024   Kevin Gonzales LM lesion is 30% stenosed.   Prox LAD lesion is 30% stenosed.   Prox LAD to Mid LAD lesion is 50% stenosed.   Prox Cx to Mid Cx lesion is 95% stenosed.   1st Mrg lesion is 90% stenosed.   Prox RCA to Mid RCA lesion is 100% stenosed.   RIMA graft was visualized by angiography and is large.   The graft exhibits no disease.   The left ventricular systolic function is normal.   LV end diastolic pressure is normal.   The left ventricular ejection fraction is 55-65% by visual estimate. CAD with critical LCx disease. This appears to be the culprit Patent stent in the proximal LAD. There is moderate diffuse nonobstructive disease in the proximal to mid LAD Occluded RCA. Widely patent RIMA to the RCA Normal LV function Normal LVEDP Plan; would consider PCI of the LCx. Due to angulation from the left main and severe calcification this is a potentially difficult PCI. I believe it would need atherectomy. Given unstable symptoms will admit to telemetry. Initiate Plavix therapy. IV heparin overnight. Will discuss with interventional colleagues with  plan to post for PCI tomorrow.   Disposition   Pt is being discharged home today in good condition.  Follow-up Plans & Appointments   Follow up appointment made on 01/22/2024 at 1:55 PM with Edd Fabian, NP  Discharge Instructions     Amb Referral to Cardiac Rehabilitation   Complete by: As directed    To [place]   Diagnosis:  Coronary Stents PTCA     After initial evaluation and assessments  completed: Virtual Based Care may be provided alone or in conjunction with Phase 2 Cardiac Rehab based on patient barriers.: Yes   Intensive Cardiac Rehabilitation (ICR) [facility] location only OR Traditional Cardiac Rehabilitation (TCR) *If criteria for ICR are not met will enroll in TCR [facility] only): Yes   Diet - low sodium heart healthy   Complete by: As directed    Discharge instructions   Complete by: As directed    PLEASE DO NOT MISS ANY DOSES OF YOUR PLAVIX!!!!! Also keep a log of you blood pressures and bring back to your follow up appt. Please call the office with any questions.   Patients taking blood thinners should generally stay away from medicines like ibuprofen, Advil, Motrin, naproxen, and Aleve due to risk of stomach bleeding. You may take Tylenol as directed or talk to your primary doctor about alternatives.  Some studies suggest Prilosec/Omeprazole interacts with Plavix. We changed your Prilosec/Omeprazole to the equivalent dose of Protonix for less chance of interaction.  PLEASE ENSURE THAT YOU DO NOT RUN OUT OF YOUR BRILINTA/PLAVIX. This medication is very important to remain on for at least 6 months. IF you have issues obtaining this medication due to cost please CALL the office 3-5 business days prior to running out in order to prevent missing doses of this medication.  Groin Site Care Refer to this sheet in the next few weeks. These instructions provide you with information on caring for yourself after your procedure. Your caregiver may also give you more specific  instructions. Your treatment has been planned according to current medical practices, but problems sometimes occur. Call your caregiver if you have any problems or questions after your procedure.  HOME CARE INSTRUCTIONS You may shower 24 hours after the procedure. Remove the bandage (dressing) and gently wash the site with plain soap and water. Gently pat the site dry.  Do not apply powder or lotion to the site.  Do not sit in a bathtub, swimming pool, or whirlpool for 5 to 7 days.  No bending, squatting, or lifting anything over 10 pounds (4.5 kg) as directed by your caregiver.  Inspect the site at least twice daily.  Do not drive home if you are discharged the same day of the procedure. Have someone else drive you.  You may drive 24 hours after the procedure unless otherwise instructed by your caregiver.  What to expect: Any bruising will usually fade within 1 to 2 weeks.  Blood that collects in the tissue (hematoma) may be painful to the touch. It should usually decrease in size and tenderness within 1 to 2 weeks.   SEEK IMMEDIATE MEDICAL CARE IF: You have unusual pain at the groin site or down the affected leg.  You have redness, warmth, swelling, or pain at the groin site.  You have drainage (other than a small amount of blood on the dressing).  You have chills.  You have a fever or persistent symptoms for more than 72 hours.  You have a fever and your symptoms suddenly get worse.  Your leg becomes pale, cool, tingly, or numb.  You have heavy bleeding from the site. Hold pressure on the site.  Get CBC in 1 week, 01/16/24 -- orders are already in   Increase activity slowly   Complete by: As directed       Discharge Medications   Allergies as of 01/09/2024       Reactions   Perflutren Lipid Microsphere Other (See Comments)   Severe lower back,  tailbone, and butt pain. Minor chest pain        Medication List     STOP taking these medications    omeprazole 20 MG  capsule Commonly known as: PRILOSEC       TAKE these medications    apixaban 5 MG Tabs tablet Commonly known as: Eliquis Take 1 tablet (5 mg total) by mouth 2 (two) times daily.   aspirin EC 81 MG tablet Take 81 mg by mouth daily. Swallow whole.   beclomethasone 80 MCG/ACT inhaler Commonly known as: QVAR Inhale 1 puff into the lungs 2 (two) times daily as needed (for shortness of breath).   clopidogrel 75 MG tablet Commonly known as: PLAVIX Take 1 tablet (75 mg total) by mouth daily. Start taking on: January 10, 2024   cyanocobalamin 1000 MCG tablet Commonly known as: VITAMIN B12 Take 1,000 mcg by mouth daily.   dofetilide 500 MCG capsule Commonly known as: TIKOSYN Take 1 capsule (500 mcg total) by mouth 2 (two) times daily.   finasteride 5 MG tablet Commonly known as: PROSCAR Take 5 mg by mouth daily.   Fish Oil Ultra 1400 MG Caps Take 1,400 mg by mouth daily.   gabapentin 300 MG capsule Commonly known as: NEURONTIN Take 300 mg by mouth at bedtime as needed (pain).   HYDROcodone-acetaminophen 10-325 MG tablet Commonly known as: NORCO Take 1 tablet by mouth 4 (four) times daily as needed for moderate pain (pain score 4-6).   losartan 100 MG tablet Commonly known as: COZAAR Take 100 mg by mouth daily.   meclizine 25 MG tablet Commonly known as: ANTIVERT Take 25 mg by mouth 3 (three) times daily as needed for dizziness.   metoprolol tartrate 25 MG tablet Commonly known as: LOPRESSOR Take 1 tablet (25 mg total) by mouth 2 (two) times daily.   pantoprazole 40 MG tablet Commonly known as: PROTONIX Take 1 tablet (40 mg total) by mouth daily. Start taking on: January 10, 2024   potassium chloride SA 20 MEQ tablet Commonly known as: KLOR-CON M Take 20 mEq by mouth daily.   pravastatin 40 MG tablet Commonly known as: PRAVACHOL Take 40 mg by mouth at bedtime.   Vitamin D (Ergocalciferol) 1.25 MG (50000 UNIT) Caps capsule Commonly known as:  DRISDOL Take 50,000 Units by mouth every Sunday.         Outstanding Labs/Studies  Follow up CBC in 1 week (outpatient order placed) and in 2 weeks (can be done at follow up appointment)   Inpatient LPA pending at discharge   Duration of Discharge Encounter: APP Time: 25 minutes   Signed, Bryan Lemma, MD 01/09/2024, 9:07 PM   ATTENDING ATTESTATION  I personally saw examined and evaluated the patient this morning on rounds along with Evlyn Clines, PA.   After reviewing all the available data and chart, we discussed the patients laboratory, study & physical findings as well as symptoms in detail.  We also discussed general summary of the patient's hospitalization along with pertinent findings, impressions and recommendations.  I agree with her f findings, examination, impression recommendations and hospital summary as noted above.  Attending adjustments noted in italics.   He went into A-fib RVR this morning, continued his Tikosyn and started him on beta-blocker shortly thereafter started having improved rate controlled from the 110 120 bpm range down into the 70s 80s and eventually converted back to sinus rhythm prior to discharge.  His right groin hematoma was notably improved that was just significant ecchymosis but no  firm hematoma noted.  The ecchymosis goes all the way down into the groin and penis.  He also has significant ecchymosis of the right radial access site.  Thankfully, he has not had any further chest pain and feels well overall.  He had no sensation of being in A-fib, which is relatively usual.  He usually just notes some fatigue has been ongoing for a while.  We have restarted his DOAC today discussed the importance of closely monitoring his groin to avoid rebleeding. Continue Tikosyn but now with the addition of low-dose beta-blocker. He is close outpatient follow-up-most notably CBC and rhythm check.  I spent 32 minutes in the care of Kevin Gonzales today  including reviewing labs (2 minutes), reviewing studies (7 minutes reviewing cath and PCI results along with telemetry and EKGs today), face to face time discussing treatment options (18 minutes), 5 minutes dictating, and documenting in the encounter.      Marykay Lex, MD, MS Bryan Lemma, MD., M.S. Interventional Cardiologist  [facility]  Pager # [phone] Phone # [phone] [address] [place], [place] [address]

## 2024-01-09 NOTE — Progress Notes (Addendum)
 Patient Name: Kevin Gonzales Date of Encounter: 01/09/2024 [facility] Cardiologist: Nanetta Batty, MD  EP Cardiologist: Dr. Ladona Ridgel  Interval Summary  .    Patient resting on side of bed, eating breakfast He re-entered atrial fibrillation with RVR, HR up to 150s and sustained in 110-130s He denies any feelings of palpitations HR of 150 was when patient was up and ambulating to bathroom His hematomas present on right forearm and groin are still significant  Overall fairly soft and non-tender, bruising has continued to spread  Patient reports that he thinks that his swelling has improved  On my assessment shortly after beta-blocker, he is feeling fine not noticing much of any significant ectopy or irregular heartbeats.  Resting comfortably.  Vital Signs .    Vitals:   01/08/24 1945 01/08/24 2323 01/09/24 0431 01/09/24 0907  BP: 124/68 115/66 134/68 134/68  Pulse: 89 69 98 (!) 112  Resp: 18 19 18    Temp: 98.2 F (36.8 C) 98.2 F (36.8 C) 97.9 F (36.6 C)   TempSrc: Oral Oral Oral   SpO2: 95% 95% 92%   Weight:      Height:       Intake/Output Summary (Last 24 hours) at 01/09/2024 0908 Last data filed at 01/08/2024 1632 Gross per 24 hour  Intake --  Output 250 ml  Net -250 ml      01/08/2024    5:08 AM 01/07/2024   12:41 PM 01/07/2024    7:51 AM  Last 3 Weights  Weight (lbs) 187 lb 1.6 oz 186 lb 9.6 oz 190 lb  Weight (kg) 84.868 kg 84.641 kg 86.183 kg     Telemetry/ECG    Paroxysmal atrial fibrillation, atrial flutter with HR 100-150s with frequent PVCs - Personally Reviewed  Physical Exam .   GEN: No acute distress.   Neck: No JVD Cardiac: irregular irregular rhythm, tachycardic, no murmurs, rubs, or gallops.  Respiratory: Clear to auscultation bilaterally. GI: Soft, nontender, non-distended  Skin: right groin hematoma with firmness around the access site, ecchymosis extended beyond area marked off on 2/25 but overall soft and non-tender, right  forearm hematoma with firmness around access site but overall soft and nontender   Groin hematoma now is much more reduced and soft.  No longer firm and rounded.  Diffuse ecchymosis around the scrotal and inguinal area not unexpected due to blood dispersion.  Very similar to the upper forearm hematoma/ecchymosis.  Assessment & Plan .     CAD s/p PCI with atherectomy and DES to circumflex 01/08/24 History of CABG 1991 Cath on 2/24 showed: patent RIMA to RCA, patent stent in prox LAD, moderate diffuse nonobstructive CAD in prox to mid LAD, critical LCx disease Underwent orbital atherectomy and stenting of LCx on 2/25 Continue ASA 81 mg daily x 30 days -low threshold to stop if bleeding recurs. Continue Plavix 75 mg daily x at least 6 months Continue pravastatin 40 mg daily   Right groin hematoma Right forearm hematoma Patient noted to have 3 inch x 3 inch hematoma 2/25 3 PM on right groin Manual pressure was held for 40 minutes  Pressure tape and gauze dressing was applied  Distal pulses palpable Area was marked off with sharpie yesterday and area of bruising has extended beyond markings  Overall hematomas are soft and nontender, areas of firmness around access sites  Hemoglobin stable at 12.2  Resume Eliquis this AM Will need close follow-up CBC next week and then at follow-up visit. Will also need  to ensure that he has strict instructions as to reasons to come back in for evaluation such as worsening pain or swelling or redness in the areas of either hematoma or ecchymosis.  Paroxsymal atrial fibrillation/atrial flutter  Episodes of RVR  Patient appears to have re-entered atrial fibrillation around 2/26 While observing telemetry this morning patient went from atrial fibrillation back to NSR to atrial flutter back to atrial fibrillation with frequent PVCs Recheck electrolytes  Continue Tikosyn 500 mcg BID Resume Eliquis this AM Start Lopressor 25 mg BID and monitor HR  If patient  maintains atrial fibrillation, schedule for TEE guided cardioversion due to interruption of Eliquis with LHC On my assessment, he was already starting to be much better rate controlled after getting 25 mg of Lopressor.  Heart rates at rest were in the 70s to 90s.  Ambulating in the hallway he did stay below 120s.  Relatively asymptomatic of the A-fib.  At this point, if he is asymptomatic I think we can forego the TEE cardioversion tomorrow and plan to simply continue Eliquis, reassess at hospital follow-up appointment in 2 to 3 weeks, if still in A-fib, can then plan for cardioversion without TEE once he has had a full 3+ weeks of uninterrupted Eliquis.  I agree with the beta-blocker for additional rate control  Hypertension Most recent BP 134/68 Continue losartan 100 mg daily along with newly added Lopressor 25 mg twice daily  Hyperlipidemia, goal LDL < 70 Lipid profile by his PCP 06/01/2022 showed total 137, LDL 70, HDL 44 LPA pending  Continue pravastatin 40 mg daily  Consider more potent statin to achieve LDL goal, defer to primary cardiologist    For questions or updates, please contact [facility] Please consult [email or web address] for contact info under        Signed, Olena Leatherwood, PA-C   ATTENDING ATTESTATION  I have seen, examined and evaluated the patient this morning on rounds along with Evlyn Clines, PA.  After reviewing all the available data and chart, we discussed the patients laboratory, study & physical findings as well as symptoms in detail.  I agree with her findings, examination as well as impression recommendations as per our discussion.    Attending adjustments noted in italics.   Tough call with him going into A-fib today.  Would otherwise be ready for discharge.  Now that he is rate controlled with the beta-blocker, still to be can potentially consider after discharge.  Will reassess heart rate and symptoms in the afternoon.  But he has done well with  ambulation and rate did not get too fast.  I think you are probably safe for the discharge home on beta-blocker plus Tikosyn with plans for close.  CBC follow-up and clinic follow-up to determine if he would need outpatient cardioversion.    Marykay Lex, MD, MS Bryan Lemma, M.D., M.S. Interventional Cardiologist  [facility]  Pager # [phone] Phone # [phone] [address] [place], [place] [address]

## 2024-01-09 NOTE — Progress Notes (Signed)
 Okay for pt to shower per Betsy Coder, PA.

## 2024-01-10 SURGERY — TRANSESOPHAGEAL ECHOCARDIOGRAM (TEE) (CATHLAB)
Anesthesia: Monitor Anesthesia Care

## 2024-01-15 ENCOUNTER — Encounter: Payer: Self-pay | Admitting: *Deleted

## 2024-01-15 ENCOUNTER — Ambulatory Visit (HOSPITAL_BASED_OUTPATIENT_CLINIC_OR_DEPARTMENT_OTHER)
Admission: RE | Admit: 2024-01-15 | Discharge: 2024-01-15 | Disposition: A | Discharge status: Home / Self Care | Payer: Medicare HMO | Source: Ambulatory Visit | Attending: Cardiovascular Disease | Admitting: Cardiovascular Disease

## 2024-01-15 DIAGNOSIS — I480 Paroxysmal atrial fibrillation: Secondary | ICD-10-CM | POA: Insufficient documentation

## 2024-01-15 DIAGNOSIS — I25110 Atherosclerotic heart disease of native coronary artery with unstable angina pectoris: Secondary | ICD-10-CM | POA: Diagnosis present

## 2024-01-15 LAB — ECHOCARDIOGRAM COMPLETE
AR max vel: 1.71 cm2
AV Area VTI: 1.67 cm2
AV Area mean vel: 1.72 cm2
AV Mean grad: 7 mmHg
AV Peak grad: 14 mmHg
Ao pk vel: 1.87 m/s
Area-P 1/2: 2.82 cm2
Calc EF: 57.2 %
S' Lateral: 2.8 cm
Single Plane A2C EF: 57.8 %
Single Plane A4C EF: 59.5 %

## 2024-01-16 ENCOUNTER — Telehealth (HOSPITAL_COMMUNITY): Payer: Self-pay | Admitting: *Deleted

## 2024-01-16 NOTE — Telephone Encounter (Signed)
 Cardiac Rehab Phase II referral faxed to [facility] per pt request. Ethelda Chick BS, ACSM-CEP 01/16/2024 1:41 PM

## 2024-01-17 ENCOUNTER — Ambulatory Visit: Payer: Medicare HMO | Admitting: General Practice

## 2024-01-18 ENCOUNTER — Encounter (HOSPITAL_BASED_OUTPATIENT_CLINIC_OR_DEPARTMENT_OTHER): Payer: Self-pay

## 2024-01-20 NOTE — Progress Notes (Unsigned)
 Cardiology Clinic Note   Patient Name: Kevin Gonzales Date of Encounter: 01/20/2024  Primary Care Provider:  Karle Plumber, MD Primary Cardiologist:  Nanetta Batty, MD  Patient Profile    Kevin Gonzales 83 year old male presents to the clinic today for follow-up evaluation of his coronary artery disease and essential hypertension.  Past Medical History    Past Medical History:  Diagnosis Date   Abdominal obesity    Alcohol abuse    Arthritis    Atrial fibrillation (HCC)    Carotid artery occlusion    Carotid bruit    Chest pain    Chondrodermatitis nodularis helicis, left    Coronary artery disease    Dyspnea    GERD (gastroesophageal reflux disease)    HOH (hard of hearing)    Hyperlipidemia    Hypertension    Myocardial infarction (HCC) 1991   Thyroid disease    Vitamin D deficiency    Past Surgical History:  Procedure Laterality Date   CARDIOVERSION N/A 07/25/2018   Procedure: CARDIOVERSION;  Surgeon: Thurmon Fair, MD;  Location: [facility] ENDOSCOPY;  Service: Cardiovascular;  Laterality: N/A;   CAROTID ENDARTERECTOMY Right 03/29/2018   CAROTID STENT  2004   CORONARY ARTERY BYPASS GRAFT     1 vessel   CORONARY ATHERECTOMY N/A 01/08/2024   Procedure: CORONARY ATHERECTOMY;  Surgeon: Tonny Bollman, MD;  Location: [facility] INVASIVE CV LAB;  Service: Cardiovascular;  Laterality: N/A;   CORONARY STENT INTERVENTION N/A 01/08/2024   Procedure: CORONARY STENT INTERVENTION;  Surgeon: Tonny Bollman, MD;  Location: [facility] INVASIVE CV LAB;  Service: Cardiovascular;  Laterality: N/A;   ENDARTERECTOMY Right 03/29/2018   Procedure: Right Carotid Endartarectomy;  Surgeon: Larina Earthly, MD;  Location: [facility] OR;  Service: Vascular;  Laterality: Right;   ENDARTERECTOMY Left 05/03/2021   Procedure: LEFT CAROTID ENDARTERECTOMY WITH PATCH ANGIOPLASTY;  Surgeon: Larina Earthly, MD;  Location: [facility] OR;  Service: Vascular;  Laterality: Left;   LEFT HEART CATH AND CORS/GRAFTS ANGIOGRAPHY N/A  05/10/2017   Procedure: Left Heart Cath and Cors/Grafts Angiography;  Surgeon: Runell Gess, MD;  Location: [facility] INVASIVE CV LAB;  Service: Cardiovascular;  Laterality: N/A;   LEFT HEART CATH AND CORS/GRAFTS ANGIOGRAPHY N/A 01/07/2024   Procedure: LEFT HEART CATH AND CORS/GRAFTS ANGIOGRAPHY;  Surgeon: Swaziland, Peter M, MD;  Location: [facility] INVASIVE CV LAB;  Service: Cardiovascular;  Laterality: N/A;   PATCH ANGIOPLASTY Right 03/29/2018   Procedure: PATCH ANGIOPLASTY;  Surgeon: Larina Earthly, MD;  Location: [facility] OR;  Service: Vascular;  Laterality: Right;    Allergies  Allergies  Allergen Reactions   Perflutren Lipid Microsphere Other (See Comments)    Severe lower back, tailbone, and butt pain. Minor chest pain    History of Present Illness    Kevin Gonzales has a PMH of coronary artery disease status post CABG in 1991, PCI to his LAD in 2005, paroxysmal atrial fibrillation on Tikosyn and Eliquis, HLD, HTN.  He was sent directly from [facility] for evaluation of ongoing chest pain and with plans for cardiac catheterization.  He presented to [facility] on 01/07/2024 and was taken to the cardiac Cath Lab for [facility].  He was noted to have coronary artery disease with circumflex that appeared to be the culprit lesion.  Due to his severe calcification it was felt that arthrectomy was necessary for management.  On 01/08/2024 he underwent successful orbital arthrectomy and stenting of 99% calcified proximal left circumflex lesion which was stented  with DES.  Aspirin x 30 days along with Plavix daily for the next 6 months was recommended.  He was restarted on his apixaban.  His cardiac catheterization was complicated as he developed a femoral hematoma.  He required manual pressure for 40+ minutes.  On 01/09/2024 his groin hematoma significantly reduced and he was noted to have diffuse bruising throughout his scrotal and inguinal areas.  He was noted to have a similar response along his right  forearm.  CBC at follow-up was recommended.  He appeared to have reentry atrial fibrillation with a heart rate of 110-150.  He was continued on Tikosyn 500 mg twice daily and started on metoprolol 25 mg twice daily for further rate control.  He was asymptomatic.  He opted out of TEE cardioversion and plan to continue apixaban and consider cardioversion in 2-3 weeks if still in atrial fibrillation.  He presents to the clinic today for follow-up evaluation and states***.  *** denies chest pain, shortness of breath, lower extremity edema, fatigue, palpitations, melena, hematuria, hemoptysis, diaphoresis, weakness, presyncope, syncope, orthopnea, and PND.  Coronary artery disease-no chest pain today.  Underwent successful orbital arthrectomy 01/08/2024.  Groin and right forearm healing well.  No signs of infection.  Ecchymosis no swelling. Heart healthy low-sodium diet Plan to continue aspirin x 30 days, Plavix, apixaban, pravastatin, metoprolol Heart healthy low-sodium diet  Hyperlipidemia-LDL***. High-fiber diet Continue pravastatin, omega-3 fatty acids  Essential hypertension-BP today***. Continue losartan, metoprolol Maintain blood pressure log  Atrial fibrillation-EKG today shows***.  Denies bleeding issues.  Reports compliance with apixaban. Continue metoprolol, apixaban DCCV?  Disposition: Follow-up with Dr. Allyson Sabal or me in 3-4 months.  Home Medications    Prior to Admission medications   Medication Sig Start Date End Date Taking? Authorizing Provider  apixaban (ELIQUIS) 5 MG TABS tablet Take 1 tablet (5 mg total) by mouth 2 (two) times daily. 10/18/20   Marinus Maw, MD  aspirin EC 81 MG tablet Take 81 mg by mouth daily. Swallow whole.    [provider]  beclomethasone (QVAR) 80 MCG/ACT inhaler Inhale 1 puff into the lungs 2 (two) times daily as needed (for shortness of breath).     [provider]  clopidogrel (PLAVIX) 75 MG tablet Take 1 tablet (75 mg total)  by mouth daily. 01/10/24   Parcells, Ladona Ridgel A, PA-C  cyanocobalamin (VITAMIN B12) 1000 MCG tablet Take 1,000 mcg by mouth daily.    [provider]  dofetilide (TIKOSYN) 500 MCG capsule Take 1 capsule (500 mcg total) by mouth 2 (two) times daily. 01/25/22   Graciella Freer, PA-C  finasteride (PROSCAR) 5 MG tablet Take 5 mg by mouth daily. 05/24/16   [provider]  gabapentin (NEURONTIN) 300 MG capsule Take 300 mg by mouth at bedtime as needed (pain). 02/28/22   [provider]  HYDROcodone-acetaminophen (NORCO) 10-325 MG tablet Take 1 tablet by mouth 4 (four) times daily as needed for moderate pain (pain score 4-6).    [provider]  losartan (COZAAR) 100 MG tablet Take 100 mg by mouth daily.    [provider]  meclizine (ANTIVERT) 25 MG tablet Take 25 mg by mouth 3 (three) times daily as needed for dizziness.    [provider]  metoprolol tartrate (LOPRESSOR) 25 MG tablet Take 1 tablet (25 mg total) by mouth 2 (two) times daily. 01/09/24   Parcells, Therisa Doyne, PA-C  Omega-3 Fatty Acids (FISH OIL ULTRA) 1400 MG CAPS Take 1,400 mg by mouth daily.  [provider]  pantoprazole (PROTONIX) 40 MG tablet Take 1 tablet (40 mg total) by mouth daily. 01/10/24   Parcells, Ladona Ridgel A, PA-C  potassium chloride SA (KLOR-CON) 20 MEQ tablet Take 20 mEq by mouth daily. 03/30/21   [provider]  pravastatin (PRAVACHOL) 40 MG tablet Take 40 mg by mouth at bedtime.    [provider]  Vitamin D, Ergocalciferol, (DRISDOL) 50000 units CAPS capsule Take 50,000 Units by mouth every Sunday.     [provider]    Family History    Family History  Problem Relation Age of Onset   Clotting disorder Father    Clotting disorder Sister    He indicated that the status of his father is unknown. He indicated that the status of his sister is unknown. He indicated that his maternal grandmother is deceased. He indicated that his  maternal grandfather is deceased. He indicated that his paternal grandmother is deceased. He indicated that his paternal grandfather is deceased.  Social History    Social History   Socioeconomic History   Marital status: Married    Spouse name: Not on file   Number of children: Not on file   Years of education: Not on file   Highest education level: Not on file  Occupational History   Not on file  Tobacco Use   Smoking status: Former    Current packs/day: 0.00    Types: Cigarettes    Start date: 5    Quit date: 36    Years since quitting: 34.2    Passive exposure: Never   Smokeless tobacco: Never  Vaping Use   Vaping status: Never Used  Substance and Sexual Activity   Alcohol use: Not Currently   Drug use: No   Sexual activity: Not Currently  Other Topics Concern   Not on file  Social History Narrative   Not on file   Social Drivers of Health   Financial Resource Strain: Not on file  Food Insecurity: No Food Insecurity (01/07/2024)   Hunger Vital Sign    Worried About Running Out of Food in the Last Year: Never true    Ran Out of Food in the Last Year: Never true  Transportation Needs: No Transportation Needs (01/07/2024)   PRAPARE - Administrator, Civil Service (Medical): No    Lack of Transportation (Non-Medical): No  Physical Activity: Not on file  Stress: Not on file  Social Connections: Moderately Integrated (01/07/2024)   Social Connection and Isolation Panel [NHANES]    Frequency of Communication with Friends and Family: More than three times a week    Frequency of Social Gatherings with Friends and Family: More than three times a week    Attends Religious Services: 1 to 4 times per year    Active Member of [employer] or Organizations: Yes    Attends Banker Meetings: 1 to 4 times per year    Marital Status: Widowed  Intimate Partner Violence: Not At Risk (01/07/2024)   Humiliation, Afraid, Rape, and Kick questionnaire    Fear of  Current or Ex-Partner: No    Emotionally Abused: No    Physically Abused: No    Sexually Abused: No     Review of Systems    General:  No chills, fever, night sweats or weight changes.  Cardiovascular:  No chest pain, dyspnea on exertion, edema, orthopnea, palpitations, paroxysmal nocturnal dyspnea. Dermatological: No rash, lesions/masses Respiratory: No cough, dyspnea Urologic: No hematuria, dysuria Abdominal:  No nausea, vomiting, diarrhea, bright red blood per rectum, melena, or hematemesis Neurologic:  No visual changes, wkns, changes in mental status. All other systems reviewed and are otherwise negative except as noted above.  Physical Exam    VS:  There were no vitals taken for this visit. , BMI There is no height or weight on file to calculate BMI. GEN: Well nourished, well developed, in no acute distress. HEENT: normal. Neck: Supple, no JVD, carotid bruits, or masses. Cardiac: RRR, no murmurs, rubs, or gallops. No clubbing, cyanosis, edema.  Radials/DP/PT 2+ and equal bilaterally.  Respiratory:  Respirations regular and unlabored, clear to auscultation bilaterally. GI: Soft, nontender, nondistended, BS + x 4. MS: no deformity or atrophy. Skin: warm and dry, no rash. Neuro:  Strength and sensation are intact. Psych: Normal affect.  Accessory Clinical Findings    Recent Labs: 01/09/2024: BUN 18; Creatinine, Ser 0.93; Hemoglobin 12.2; Magnesium 2.0; Platelets 209; Potassium 3.8; Sodium 140   Recent Lipid Panel No results found for: "CHOL", "TRIG", "HDL", "CHOLHDL", "VLDL", "LDLCALC", "LDLDIRECT"  No BP recorded.  {Refresh Note OR Click here to enter BP  :1}***    ECG personally reviewed by me today- ***    Echocardiogram 01/15/2024 IMPRESSIONS     1. Rhythm strip during this exam demonstrates sinus versus ectopic atrial  bradycardia with heart rate in 40's.   2. Left ventricular ejection fraction, by estimation, is 55 to 60%. The  left ventricle has normal  function. The left ventricle has no regional  wall motion abnormalities.   3. Right ventricular systolic function is normal. The right ventricular  size is normal.   4. There is normal pulmonary artery systolic pressure. The estimated  right ventricular systolic pressure is 30.5 mmHg.   5. The mitral valve is degenerative. Trivial mitral valve regurgitation.  No evidence of mitral stenosis.   6. The aortic valve is tricuspid. There is moderate thickening of the  aortic valve. Aortic valve regurgitation is not visualized. No aortic  stenosis is present.   7. The inferior vena cava is normal in size with greater than 50%  respiratory variability, suggesting right atrial pressure of 3 mmHg.   Comparison(s): Echocardiogram done 07/12/18 reported an LVEF of 60-65%.   FINDINGS   Left Ventricle: Left ventricular ejection fraction, by estimation, is 55  to 60%. The left ventricle has normal function. The left ventricle has no  regional wall motion abnormalities. The left ventricular internal cavity  size was normal in size. There is   no left ventricular hypertrophy.   Right Ventricle: The right ventricular size is normal. Right vetricular  wall thickness was not well visualized. Right ventricular systolic  function is normal. There is normal pulmonary artery systolic pressure.  The tricuspid regurgitant velocity is 2.62  m/s, and with an assumed right atrial pressure of 3 mmHg, the estimated  right ventricular systolic pressure is 30.5 mmHg.   Left Atrium: Left atrial size was normal in size.   Right Atrium: Right atrial size was normal in size.   Pericardium: There is no evidence of pericardial effusion.   Mitral Valve: The mitral valve is degenerative in appearance. Mild mitral  annular calcification. Trivial mitral valve regurgitation. No evidence of  mitral valve stenosis.   Tricuspid Valve: The tricuspid valve is grossly normal. Tricuspid valve  regurgitation is mild . No  evidence of tricuspid stenosis.   Aortic Valve: The aortic valve is tricuspid. There is moderate thickening  of the aortic valve. Aortic valve  regurgitation is not visualized. No  aortic stenosis is present. Aortic valve mean gradient measures 7.0 mmHg.  Aortic valve peak gradient measures  14.0 mmHg. Aortic valve area, by VTI measures 1.67 cm.   Pulmonic Valve: The pulmonic valve was grossly normal. Pulmonic valve  regurgitation is trivial. No evidence of pulmonic stenosis.   Aorta: The aortic root and ascending aorta are structurally normal, with  no evidence of dilitation.   Venous: The inferior vena cava is normal in size with greater than 50%  respiratory variability, suggesting right atrial pressure of 3 mmHg.   IAS/Shunts: The interatrial septum was not well visualized.     Cardiac catheterization 01/08/2024  Successful orbital atherectomy and stenting of critical 99% calcific stenosis in the proximal left circumflex, lesion stented with a 2.75 x 16 mm Synergy DES   Recommendations: Aspirin 81 mg daily x 30 days Clopidogrel 75 mg daily times at least 6 months Resume apixaban 5 mg twice daily tomorrow  Diagnostic Dominance: Right  Intervention       Assessment & Plan   1.  ***   Thomasene Ripple. Tearra Ouk NP-C     01/20/2024, 3:50 PM [facility] [address] Office [phone] Fax [phone]    I spent***minutes examining this patient, reviewing medications, and using patient centered shared decision making involving their cardiac care.   I spent  20 minutes reviewing past medical history,  medications, and prior cardiac tests.

## 2024-01-22 ENCOUNTER — Encounter: Payer: Self-pay | Admitting: General Practice

## 2024-01-22 ENCOUNTER — Ambulatory Visit: Payer: Medicare HMO | Attending: General Practice | Admitting: General Practice

## 2024-01-22 VITALS — BP 108/66 | HR 53 | Ht 67.0 in | Wt 197.2 lb

## 2024-01-22 DIAGNOSIS — I2510 Atherosclerotic heart disease of native coronary artery without angina pectoris: Secondary | ICD-10-CM | POA: Diagnosis not present

## 2024-01-22 DIAGNOSIS — I10 Essential (primary) hypertension: Secondary | ICD-10-CM

## 2024-01-22 DIAGNOSIS — I480 Paroxysmal atrial fibrillation: Secondary | ICD-10-CM | POA: Diagnosis not present

## 2024-01-22 DIAGNOSIS — E7800 Pure hypercholesterolemia, unspecified: Secondary | ICD-10-CM | POA: Diagnosis not present

## 2024-01-22 MED ORDER — LOSARTAN POTASSIUM 100 MG PO TABS
100.0000 mg | ORAL_TABLET | Freq: Every day | ORAL | 3 refills | Status: AC
Start: 2024-01-22 — End: ?

## 2024-01-22 MED ORDER — APIXABAN 5 MG PO TABS
5.0000 mg | ORAL_TABLET | Freq: Two times a day (BID) | ORAL | 3 refills | Status: AC
Start: 2024-01-22 — End: ?

## 2024-01-22 MED ORDER — PRAVASTATIN SODIUM 40 MG PO TABS: 40.0000 mg | ORAL_TABLET | Freq: Every day | ORAL | 3 refills | Status: DC

## 2024-01-22 MED ORDER — PANTOPRAZOLE SODIUM 40 MG PO TBEC
40.0000 mg | DELAYED_RELEASE_TABLET | Freq: Every day | ORAL | 3 refills | Status: AC
Start: 2024-01-22 — End: ?

## 2024-01-22 MED ORDER — METOPROLOL TARTRATE 25 MG PO TABS: 25.0000 mg | ORAL_TABLET | Freq: Two times a day (BID) | ORAL | 3 refills | Status: DC

## 2024-01-22 MED ORDER — CLOPIDOGREL BISULFATE 75 MG PO TABS: 75.0000 mg | ORAL_TABLET | Freq: Every day | ORAL | 3 refills | Status: AC

## 2024-01-22 MED ORDER — DOFETILIDE 500 MCG PO CAPS
500.0000 ug | ORAL_CAPSULE | Freq: Two times a day (BID) | ORAL | 3 refills | Status: AC
Start: 2024-01-22 — End: ?

## 2024-01-22 MED ORDER — POTASSIUM CHLORIDE CRYS ER 20 MEQ PO TBCR: 20.0000 meq | EXTENDED_RELEASE_TABLET | Freq: Every day | ORAL | 3 refills | Status: AC

## 2024-01-22 NOTE — Patient Instructions (Signed)
 Medication Instructions:  STOP ASPIRIN ON 02-06-2024 *If you need a refill on your cardiac medications before your next appointment, please call your pharmacy*  Lab Work: NONE  Testing/Procedures: NONE  Follow-Up: At [facility], you and your health needs are our priority.  As part of our continuing mission to provide you with exceptional heart care, we have created designated Provider Care Teams.  These Care Teams include your primary Cardiologist (physician) and Advanced Practice Providers (APPs -  Physician Assistants and Nurse Practitioners) who all work together to provide you with the care you need, when you need it.  Your next appointment:   3-4 month(s)  Provider:   Nanetta Batty, MD  KEEP SCHEDULED APPOINTMENT   OTHER:  INCREASE PHYSICAL ACTIVITY AS TOLERATED DO  CHAIR EXERCISES DAILY MAY TAKE SUNDAY OFF  Exercises to do While Sitting  Exercises that you do while sitting (chair exercises) can give you many of the same benefits as full exercise. Benefits include strengthening your heart, burning calories, and keeping muscles and joints healthy. Exercise can also improve your mood and help with depression and anxiety. You may benefit from chair exercises if you are unable to do standing exercises due to: Diabetic foot pain. Obesity. Illness. Arthritis. Recovery from surgery or injury. Breathing problems. Balance problems. Another type of disability. Before starting chair exercises, check with your health care provider or a physical therapist to find out how much exercise you can tolerate and which exercises are safe for you. If your health care provider approves: Start out slowly and build up over time. Aim to work up to about 10-20 minutes for each exercise session. Make exercise part of your daily routine. Drink water when you exercise. Do not wait until you are thirsty. Drink every 10-15 minutes. Stop exercising right away if you have pain, nausea, shortness  of breath, or dizziness. If you are exercising in a wheelchair, make sure to lock the wheels. Ask your health care provider whether you can do tai chi or yoga. Many positions in these mind-body exercises can be modified to do while seated. Warm-up Before starting other exercises: Sit up as straight as you can. Have your knees bent at 90 degrees, which is the shape of the capital letter "L." Keep your feet flat on the floor. Sit at the front edge of your chair, if you can. Pull in (tighten) the muscles in your abdomen and stretch your spine and neck as straight as you can. Hold this position for a few minutes. Breathe in and out evenly. Try to concentrate on your breathing, and relax your mind. Stretching Exercise A: Arm stretch Hold your arms out straight in front of your body. Bend your hands at the wrist with your fingers pointing up, as if signaling someone to stop. Notice the slight tension in your forearms as you hold the position. Keeping your arms out and your hands bent, rotate your hands outward as far as you can and hold this stretch. Aim to have your thumbs pointing up and your pinkie fingers pointing down. Slowly repeat arm stretches for one minute as tolerated. Exercise B: Leg stretch If you can move your legs, try to "draw" letters on the floor with the toes of your foot. Write your name with one foot. Write your name with the toes of your other foot. Slowly repeat the movements for one minute as tolerated. Exercise C: Reach for the sky Reach your hands as far over your head as you can to stretch  your spine. Move your hands and arms as if you are climbing a rope. Slowly repeat the movements for one minute as tolerated. Range of motion exercises Exercise A: Shoulder roll Let your arms hang loosely at your sides. Lift just your shoulders up toward your ears, then let them relax back down. When your shoulders feel loose, rotate your shoulders in backward and forward circles. Do  shoulder rolls slowly for one minute as tolerated. Exercise B: March in place As if you are marching, pump your arms and lift your legs up and down. Lift your knees as high as you can. If you are unable to lift your knees, just pump your arms and move your ankles and feet up and down. March in place for one minute as tolerated. Exercise C: Seated jumping jacks Let your arms hang down straight. Keeping your arms straight, lift them up over your head. Aim to point your fingers to the ceiling. While you lift your arms, straighten your legs and slide your heels along the floor to your sides, as wide as you can. As you bring your arms back down to your sides, slide your legs back together. If you are unable to use your legs, just move your arms. Slowly repeat seated jumping jacks for one minute as tolerated. Strengthening exercises Exercise A: Shoulder squeeze Hold your arms straight out from your body to your sides, with your elbows bent and your fists pointed at the ceiling. Keeping your arms in the bent position, move them forward so your elbows and forearms meet in front of your face. Open your arms back out as wide as you can with your elbows still bent, until you feel your shoulder blades squeezing together. Hold for 5 seconds. Slowly repeat the movements forward and backward for one minute as tolerated. Contact a health care provider if: You have to stop exercising due to any of the following: Pain. Nausea. Shortness of breath. Dizziness. Fatigue. You have significant pain or soreness after exercising. Get help right away if: You have chest pain. You have difficulty breathing. These symptoms may represent a serious problem that is an emergency. Do not wait to see if the symptoms will go away. Get medical help right away. Call your local emergency services (911 in the [place] Do not drive yourself to the hospital. Summary Exercises that you do while sitting (chair exercises) can  strengthen your heart, burn calories, and keep muscles and joints healthy. You may benefit from chair exercises if you are unable to do standing exercises due to diabetic foot pain, obesity, recovery from surgery or injury, or other conditions. Before starting chair exercises, check with your health care provider or a physical therapist to find out how much exercise you can tolerate and which exercises are safe for you. This information is not intended to replace advice given to you by your health care provider. Make sure you discuss any questions you have with your health care provider. Document Revised: 12/26/2020 Document Reviewed: 12/26/2020 Elsevier Patient Education  2024 [employer].

## 2024-02-04 ENCOUNTER — Other Ambulatory Visit: Payer: Self-pay

## 2024-02-04 MED ORDER — METOPROLOL TARTRATE 25 MG PO TABS: 25.0000 mg | ORAL_TABLET | Freq: Two times a day (BID) | ORAL | 3 refills | Status: AC

## 2024-02-26 ENCOUNTER — Telehealth: Payer: Self-pay | Admitting: Cardiovascular Disease

## 2024-02-26 NOTE — Telephone Encounter (Signed)
 Pt c/o BP issue: STAT if pt c/o blurred vision, one-sided weakness or slurred speech.  STAT if BP is GREATER than 180/120 TODAY.  STAT if BP is LESS than 90/60 and SYMPTOMATIC TODAY  1. What is your BP concern?   Patient is concerned his BP has been reading high  2. Have you taken any BP medication today?  Yes, as prescribed  3. What are your last 5 BP readings?  Today - 170/80 Yesterday - 149/60  4. Are you having any other symptoms (ex. Dizziness, headache, blurred vision, passed out)?  A dizzy spell every once in a while  Patient is concerned his BP has been reading high and the site where they initially tried to place the stent is still tender to touch.

## 2024-02-26 NOTE — Telephone Encounter (Signed)
 Patient identification verified by 2 forms. Kevin Lucky, RN    Called and spoke to patient  Patient states:   -BP has been high since stent was placed   -had OV on 3/11 with NP Cleaver regarding BP   -when BP is checked at home it is elevated   -Takes losartan 100 mg daily   -Takes Metoprolol 25 mg twice daily   -today BP was 170/80 after medication   -4/14: 149/60 after medication   -he has OV with PCP tomorrow  Patient denies:   -headache   -chest pressure/pain   -SOB/difficulty breathing  Advised patient:   -keep OV with PCP tomorrow   -start BP log and review at 4/23 OV with Dr. Katheryne Pane  Reviewed ED warning signs/precautions  Patient verbalized understanding, no questions at this time

## 2024-03-05 ENCOUNTER — Ambulatory Visit: Attending: Cardiovascular Disease | Admitting: Cardiovascular Disease

## 2024-03-05 ENCOUNTER — Encounter: Payer: Self-pay | Admitting: Cardiovascular Disease

## 2024-03-05 VITALS — BP 148/61 | HR 54 | Ht 68.0 in | Wt 192.8 lb

## 2024-03-05 DIAGNOSIS — I2510 Atherosclerotic heart disease of native coronary artery without angina pectoris: Secondary | ICD-10-CM | POA: Diagnosis not present

## 2024-03-05 DIAGNOSIS — R0989 Other specified symptoms and signs involving the circulatory and respiratory systems: Secondary | ICD-10-CM

## 2024-03-05 DIAGNOSIS — E785 Hyperlipidemia, unspecified: Secondary | ICD-10-CM | POA: Diagnosis not present

## 2024-03-05 NOTE — Assessment & Plan Note (Signed)
History of hyperlipidemia on statin therapy followed by his PCP 

## 2024-03-05 NOTE — Assessment & Plan Note (Signed)
 History of CAD status post CABG x 1 in 1991 with a RIMA to his RCA by Dr. Monta Anton at [facility].  He had a 2.75 mm x 16 mm long Taxus drug-eluting stent placed to his proximal LAD 02/20/04.  I cathed him 05/10/2017 revealing normal LV function, patent RIMA to the RCA and an occluded native RCA with a patent stent of the proximal mLAD which I placed in 2005 as well as high-grade ostial nondominant circumflex.  Medical therapy was recommended.  Because of progressive chest pain and shortness of breath he underwent repeat cardiac catheterization by Dr. Addie Holstein at my request 01/03/2024 revealing unchanged anatomy with 30% in-stent restenosis within his proximal LAD stent, patent RIMA to the PDA and 99% calcified proximal nondominant circumflex.  This was done via the right radial approach.  The following day he underwent orbital atherectomy, PCI and drug-eluting stenting of the circumflex by Dr. Arlester Ladd via the femoral approach.  This was complicated by a femoral hematoma which was reduced.  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  I am going to get a Doppler study to confirm.  He was on "triple therapy for a month" and now is on clopidogrel  and Eliquis .

## 2024-03-05 NOTE — Assessment & Plan Note (Signed)
 History of carotid artery disease status post elective right carotid endarterectomy by Dr. Ena Harries early 03/29/2018.  His last carotid Doppler ordered by vein and vascular specialist a year ago showed a widely patent endarterectomy site.

## 2024-03-05 NOTE — Patient Instructions (Signed)
 Medication Instructions:  Your physician recommends that you continue on your current medications as directed. Please refer to the Current Medication list given to you today.  *If you need a refill on your cardiac medications before your next appointment, please call your pharmacy*   Testing/Procedures: Your physician has requested that you have a upper extremity arterial duplex. This test is an ultrasound of the arteries in the arms. It looks at arterial blood flow in the arms. Allow one hour for Upper Arterial scans. There are no restrictions or special instructions.  Please note: We ask at that you not bring children with you during ultrasound (echo/ vascular) testing. Due to room size and safety concerns, children are not allowed in the ultrasound rooms during exams. Our front office staff cannot provide observation of children in our lobby area while testing is being conducted. An adult accompanying a patient to their appointment will only be allowed in the ultrasound room at the discretion of the ultrasound technician under special circumstances. We apologize for any inconvenience.    Follow-Up: At [facility], you and your health needs are our priority.  As part of our continuing mission to provide you with exceptional heart care, our providers are all part of one team.  This team includes your primary Cardiologist (physician) and Advanced Practice Providers or APPs (Physician Assistants and Nurse Practitioners) who all work together to provide you with the care you need, when you need it.  Your next appointment:   6 month(s)  Provider:   Lawana Pray, FNP       Then, Lauro Portal, MD will plan to see you again in 12 month(s).    We recommend signing up for the patient portal called "MyChart".  Sign up information is provided on this After Visit Summary.  MyChart is used to connect with patients for Virtual Visits (Telemedicine).  Patients are able to view lab/test  results, encounter notes, upcoming appointments, etc.  Non-urgent messages can be sent to your provider as well.   To learn more about what you can do with MyChart, go to [email or web address].   Other Instructions       1st Floor: - Lobby - Registration  - Pharmacy  - Lab - Cafe  2nd Floor: - PV Lab - Diagnostic Testing (echo, CT, nuclear med)  3rd Floor: - Vacant  4th Floor: - TCTS (cardiothoracic surgery) - AFib Clinic - Structural Heart Clinic - Vascular Surgery  - Vascular Ultrasound  5th Floor: - HeartCare Cardiology (general and EP) - Clinical Pharmacy for coumadin, hypertension, lipid, weight-loss medications, and med management appointments    Valet parking services will be available as well.

## 2024-03-05 NOTE — Assessment & Plan Note (Signed)
 History of PAF maintaining sinus rhythm on Eliquis  and Tikosyn  followed by Dr. Carolynne Citron in the past.

## 2024-03-05 NOTE — Progress Notes (Signed)
 03/05/2024 Laney Piper   1941/03/07  [id number]  Primary Physician Ladonna Pickup, MD Primary Cardiologist: Avanell Leigh MD Bennye Bravo, [place]  HPI:  Kevin Gonzales is a 83 y.o.   mildly overweight married Caucasian male father of 1 , grandfather one grandchild referred by Laray Platt for cardiovascular evaluation and coronary angiography because of ongoing chest pain.  I last saw him in the office 12/25/2023.  He has a history of treated hypertension, hyperlipidemia and remote tobacco abuse having quit in 1991. He had coronary artery bypass grafting times one in 1991 with a RIMA to his RCA by Dr. Monta Anton at [facility]. I placed a 2.75 mm x 16 mm long Taxus drug-eluting stent in his proximal LAD 02/20/04.  I performed cardiac catheterization on him 05/10/2017 revealing normal LV function, a patent RIMA to the RCA with with an occluded native RCA, patent stent to the proximal LAD which I placed in 2005 and high-grade ostial nondominant circumflex.  Medical therapy was recommended.  He recently had elective right carotid endarterectomy performed by Dr. Ena Harries Early 03/29/2018 and had episodes of PAF in the perioperative period.  He was seen by Dr. Audery Blazing who began him on Eliquis  oral anticoagulation.   He was admitted to the hospital 08/12/2018 for "Tikosyn  load" and converted to sinus rhythm which he has maintained on Eliquis  oral anticoagulation.  He has maintained sinus rhythm.   Since I saw him in the office 2 months ago I did refer him for outpatient cardiac cath performed by Dr. Addie Holstein on 01/07/2024 which revealed a patent LAD stent, patent RIMA to the PDA and 99% calcified proximal nondominant circumflex.  This was done via the right radial approach.  Following day he had his circumflex atherectomized and stented by Dr. Arlester Ladd via the femoral approach.  This was complicated by a hematoma that was reduced.  Since that time the symptoms have improved.  He was on  "triple therapy" for a month and aspirin  has since been discontinued.  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  He is asymptomatic from this..   Current Meds  Medication Sig   apixaban  (ELIQUIS ) 5 MG TABS tablet Take 1 tablet (5 mg total) by mouth 2 (two) times daily.   beclomethasone (QVAR) 80 MCG/ACT inhaler Inhale 1 puff into the lungs 2 (two) times daily as needed (for shortness of breath).    clopidogrel  (PLAVIX ) 75 MG tablet Take 1 tablet (75 mg total) by mouth daily.   cyanocobalamin (VITAMIN B12) 1000 MCG tablet Take 1,000 mcg by mouth daily.   dofetilide  (TIKOSYN ) 500 MCG capsule Take 1 capsule (500 mcg total) by mouth 2 (two) times daily.   finasteride  (PROSCAR ) 5 MG tablet Take 5 mg by mouth daily.   gabapentin (NEURONTIN) 300 MG capsule Take 300 mg by mouth at bedtime as needed (pain).   HYDROcodone -acetaminophen  (NORCO) 10-325 MG tablet Take 1 tablet by mouth 4 (four) times daily as needed for moderate pain (pain score 4-6).   losartan  (COZAAR ) 100 MG tablet Take 1 tablet (100 mg total) by mouth daily.   meclizine  (ANTIVERT ) 25 MG tablet Take 25 mg by mouth 3 (three) times daily as needed for dizziness.   metoprolol  tartrate (LOPRESSOR ) 25 MG tablet Take 1 tablet (25 mg total) by mouth 2 (two) times daily.   Omega-3 Fatty Acids (FISH OIL  ULTRA)  1400 MG CAPS Take 1,400 mg by mouth daily.   pantoprazole  (PROTONIX ) 40 MG tablet Take 1 tablet (40 mg total) by mouth daily.   potassium chloride  SA (KLOR-CON  M) 20 MEQ tablet Take 1 tablet (20 mEq total) by mouth daily.   pravastatin  (PRAVACHOL ) 40 MG tablet Take 1 tablet (40 mg total) by mouth at bedtime.   Vitamin D , Ergocalciferol , (DRISDOL ) 50000 units CAPS capsule Take 50,000 Units by mouth every Sunday.      Allergies  Allergen Reactions   Perflutren  Lipid Microsphere Other (See Comments)    Severe lower back, tailbone, and butt pain. Minor chest pain     Social History   Socioeconomic History   Marital status: Married    Spouse name: Not on file   Number of children: Not on file   Years of education: Not on file   Highest education level: Not on file  Occupational History   Not on file  Tobacco Use   Smoking status: Former    Current packs/day: 0.00    Types: Cigarettes    Start date: 76    Quit date: 25    Years since quitting: 34.3    Passive exposure: Never   Smokeless tobacco: Never  Vaping Use   Vaping status: Never Used  Substance and Sexual Activity   Alcohol use: Not Currently   Drug use: No   Sexual activity: Not Currently  Other Topics Concern   Not on file  Social History Narrative   Not on file   Social Drivers of Health   Financial Resource Strain: Not on file  Food Insecurity: No Food Insecurity (01/07/2024)   Hunger Vital Sign    Worried About Running Out of Food in the Last Year: Never true    Ran Out of Food in the Last Year: Never true  Transportation Needs: No Transportation Needs (01/07/2024)   PRAPARE - Administrator, Civil Service (Medical): No    Lack of Transportation (Non-Medical): No  Physical Activity: Not on file  Stress: Not on file  Social Connections: Moderately Integrated (01/07/2024)   Social Connection and Isolation Panel [NHANES]    Frequency of Communication with Friends and Family: More than three times a week    Frequency of Social Gatherings with Friends and Family: More than three times a week    Attends Religious Services: 1 to 4 times per year    Active Member of [employer] or Organizations: Yes    Attends Banker Meetings: 1 to 4 times per year    Marital Status: Widowed  Intimate Partner Violence: Not At Risk (01/07/2024)   Humiliation, Afraid, Rape, and Kick questionnaire    Fear of Current or Ex-Partner: No    Emotionally Abused: No    Physically Abused: No    Sexually Abused: No     Review of Systems: General: negative for chills,  fever, night sweats or weight changes.  Cardiovascular: negative for chest pain, dyspnea on exertion, edema, orthopnea, palpitations, paroxysmal nocturnal dyspnea or shortness of breath Dermatological: negative for rash Respiratory: negative for cough or wheezing Urologic: negative for hematuria Abdominal: negative for nausea, vomiting, diarrhea, bright red blood per rectum, melena, or hematemesis Neurologic: negative for visual changes, syncope, or dizziness All other systems reviewed and are otherwise negative except as noted above.    Blood pressure (!) 148/61, pulse (!) 54, height 5\' 8"  (1.727 m), weight 192 lb 12.8 oz (87.5 kg), SpO2 95%.  General appearance: alert and  no distress Neck: no adenopathy, no carotid bruit, no JVD, supple, symmetrical, trachea midline, and thyroid  not enlarged, symmetric, no tenderness/mass/nodules Lungs: clear to auscultation bilaterally Heart: regular rate and rhythm, S1, S2 normal, no murmur, click, rub or gallop Extremities: Right wrist has what appears to be a pseudoaneurysm over the right radial artery.  There is a bruit noted. Pulses: 2+ and symmetric Skin: Skin color, texture, turgor normal. No rashes or lesions Neurologic: Grossly normal  EKG not performed today      ASSESSMENT AND PLAN:   Coronary artery disease History of CAD status post CABG x 1 in 1991 with a RIMA to his RCA by Dr. Monta Anton at [facility].  He had a 2.75 mm x 16 mm long Taxus drug-eluting stent placed to his proximal LAD 02/20/04.  I cathed him 05/10/2017 revealing normal LV function, patent RIMA to the RCA and an occluded native RCA with a patent stent of the proximal mLAD which I placed in 2005 as well as high-grade ostial nondominant circumflex.  Medical therapy was recommended.  Because of progressive chest pain and shortness of breath he underwent repeat cardiac catheterization by Dr. Addie Holstein at my request 01/03/2024 revealing unchanged anatomy with 30%  in-stent restenosis within his proximal LAD stent, patent RIMA to the PDA and 99% calcified proximal nondominant circumflex.  This was done via the right radial approach.  The following day he underwent orbital atherectomy, PCI and drug-eluting stenting of the circumflex by Dr. Arlester Ladd via the femoral approach.  This was complicated by a femoral hematoma which was reduced.  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  I am going to get a Doppler study to confirm.  He was on "triple therapy for a month" and now is on clopidogrel  and Eliquis .  Hyperlipidemia with target LDL less than 70 History of hyperlipidemia on statin therapy followed by his PCP.  Carotid artery stenosis History of carotid artery disease status post elective right carotid endarterectomy by Dr. Ena Harries early 03/29/2018.  His last carotid Doppler ordered by vein and vascular specialist a year ago showed a widely patent endarterectomy site.  PAF (paroxysmal atrial fibrillation) (HCC) History of PAF maintaining sinus rhythm on Eliquis  and Tikosyn  followed by Dr. Carolynne Citron in the past.     Avanell Leigh MD [facility], [facility] 03/05/2024 10:36 AM

## 2024-03-25 ENCOUNTER — Ambulatory Visit: Payer: Medicare HMO | Admitting: Cardiovascular Disease

## 2024-04-02 NOTE — Progress Notes (Signed)
 Cardiology Clinic Note   Patient Name: Kevin Gonzales Date of Encounter: 04/04/2024  Primary Care Provider:  Ladonna Pickup, MD Primary Cardiologist:  Lauro Portal, MD  Patient Profile    Kevin Gonzales 83 year old male presents to the clinic today for follow-up evaluation of his coronary artery disease and essential hypertension.  Past Medical History    Past Medical History:  Diagnosis Date   Abdominal obesity    Alcohol abuse    Arthritis    Atrial fibrillation (HCC)    Carotid artery occlusion    Carotid bruit    Chest pain    Chondrodermatitis nodularis helicis, left    Coronary artery disease    Dyspnea    GERD (gastroesophageal reflux disease)    HOH (hard of hearing)    Hyperlipidemia    Hypertension    Myocardial infarction (HCC) 1991   Thyroid  disease    Vitamin D  deficiency    Past Surgical History:  Procedure Laterality Date   CARDIOVERSION N/A 07/25/2018   Procedure: CARDIOVERSION;  Surgeon: Luana Rumple, MD;  Location: [facility] ENDOSCOPY;  Service: Cardiovascular;  Laterality: N/A;   CAROTID ENDARTERECTOMY Right 03/29/2018   CAROTID STENT  2004   CORONARY ARTERY BYPASS GRAFT     1 vessel   CORONARY ATHERECTOMY N/A 01/08/2024   Procedure: CORONARY ATHERECTOMY;  Surgeon: Arnoldo Lapping, MD;  Location: [facility] INVASIVE CV LAB;  Service: Cardiovascular;  Laterality: N/A;   CORONARY STENT INTERVENTION N/A 01/08/2024   Procedure: CORONARY STENT INTERVENTION;  Surgeon: Arnoldo Lapping, MD;  Location: [facility] INVASIVE CV LAB;  Service: Cardiovascular;  Laterality: N/A;   ENDARTERECTOMY Right 03/29/2018   Procedure: Right Carotid Endartarectomy;  Surgeon: Mayo Speck, MD;  Location: [facility] OR;  Service: Vascular;  Laterality: Right;   ENDARTERECTOMY Left 05/03/2021   Procedure: LEFT CAROTID ENDARTERECTOMY WITH PATCH ANGIOPLASTY;  Surgeon: Mayo Speck, MD;  Location: [facility] OR;  Service: Vascular;  Laterality: Left;   LEFT HEART CATH AND CORS/GRAFTS ANGIOGRAPHY N/A  05/10/2017   Procedure: Left Heart Cath and Cors/Grafts Angiography;  Surgeon: Avanell Leigh, MD;  Location: [facility] INVASIVE CV LAB;  Service: Cardiovascular;  Laterality: N/A;   LEFT HEART CATH AND CORS/GRAFTS ANGIOGRAPHY N/A 01/07/2024   Procedure: LEFT HEART CATH AND CORS/GRAFTS ANGIOGRAPHY;  Surgeon: Swaziland, Peter M, MD;  Location: [facility] INVASIVE CV LAB;  Service: Cardiovascular;  Laterality: N/A;   PATCH ANGIOPLASTY Right 03/29/2018   Procedure: PATCH ANGIOPLASTY;  Surgeon: Mayo Speck, MD;  Location: [facility] OR;  Service: Vascular;  Laterality: Right;    Allergies  Allergies  Allergen Reactions   Perflutren  Lipid Microsphere Other (See Comments)    Severe lower back, tailbone, and butt pain. Minor chest pain    History of Present Illness    Kevin Gonzales has a PMH of coronary artery disease status post CABG in 1991, PCI to his LAD in 2005, paroxysmal atrial fibrillation on Tikosyn  and Eliquis , HLD, HTN.  He was sent directly from [facility] for evaluation of ongoing chest pain and with plans for cardiac catheterization.  He presented to [facility] on 01/07/2024 and was taken to the cardiac Cath Lab for [facility].  He was noted to have coronary artery disease with circumflex that appeared to be the culprit lesion.  Due to his severe calcification it was felt that arthrectomy was necessary for management.  On 01/08/2024 he underwent successful orbital arthrectomy and stenting of 99% calcified proximal left circumflex lesion which was stented  with DES.  Aspirin  x 30 days along with Plavix  daily for the next 6 months was recommended.  He was restarted on his apixaban .  His cardiac catheterization was complicated as he developed a femoral hematoma.  He required manual pressure for 40+ minutes.  On 01/09/2024 his groin hematoma significantly reduced and he was noted to have diffuse bruising throughout his scrotal and inguinal areas.  He was noted to have a similar response along his right  forearm.  CBC at follow-up was recommended.  He appeared to have reentry atrial fibrillation with a heart rate of 110-150.  He was continued on Tikosyn  500 mg twice daily and started on metoprolol  25 mg twice daily for further rate control.  He was asymptomatic.  He opted out of TEE cardioversion and plan to continue apixaban  and consider cardioversion in 2-3 weeks if still in atrial fibrillation.  He presented to the clinic 01/22/24 for follow-up evaluation and stated he was concerned about his groin and right wrist.  I examined these.  We reviewed his cardiac catheterization and arthrectomy.  He expressed understanding.  His cath sites were healing well without signs of infection.  He reported that he was slowly increasing his physical activity.  His breathing was better when he went outside.  He did continue to have shortness of breath.  He attributed that to his weight.  He did note that he had lost some weight.  He needed refills on his medications.  I sent these to [facility].  I planned follow-up as scheduled.  He was seen in follow-up by Dr. Katheryne Pane on 03/05/2024.  His blood pressure was noted to be 148/61.  He continued to improve.  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  He was asymptomatic with this.  Doppler/ultrasound was ordered.   He presents to the clinic today for follow-up evaluation and states he is here today to review results of right forearm ultrasound.  I explained that he has this test scheduled for 04/11/2024.  He reports that he had right forearm ultrasound completed at [facility] while he was there 1 week ago.  On review results are not available for me to review at this time.  He notes that he has been having some congestion and an itchy throat.  He attributes this to allergies.  He occasionally notes dizziness with standing.  I recommended that he trial Claritin.  His blood pressure is well-controlled today  at 118/80.  I will request right forearm ultrasounds from [facility] and plan follow-up in 6 months.  Addendum to previous note: Fax received from [facility] medical.Ultrasound upper venous right study showed no evidence of DVT.  Ultrasound upper arterial unilateral study showed plaque in the brachial artery with decreased flow.  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  The radial artery appears slightly compressed at the site of the pseudoaneurysm but there is flow present.  I will go ahead with previously scheduled right upper extremity arterial duplex for further prognostication.  Reviewed report does not appear to be definitive in diagnosis stating probable pseudoaneurysm.  Attempted to contact patient to review test results.  I left detailed message noting that he should proceed with scheduled right upper extremity arterial ultrasound as planned on 04/11/2024.  I instructed him to contact the office with further questions or concerns.  Today he denies chest pain, shortness of breath, lower extremity  edema, fatigue, palpitations, melena, hematuria, hemoptysis, diaphoresis, weakness, presyncope, syncope, orthopnea, and PND.    Home Medications    Prior to Admission medications   Medication Sig Start Date End Date Taking? Authorizing Provider  apixaban  (ELIQUIS ) 5 MG TABS tablet Take 1 tablet (5 mg total) by mouth 2 (two) times daily. 10/18/20   Tammie Fall, MD  aspirin  EC 81 MG tablet Take 81 mg by mouth daily. Swallow whole.    [provider]  beclomethasone (QVAR) 80 MCG/ACT inhaler Inhale 1 puff into the lungs 2 (two) times daily as needed (for shortness of breath).     [provider]  clopidogrel  (PLAVIX ) 75 MG tablet Take 1 tablet (75 mg total) by mouth daily. 01/10/24   Parcells, Carolynne Citron A, PA-C  cyanocobalamin (VITAMIN B12) 1000 MCG tablet Take 1,000 mcg by mouth daily.    [provider]  dofetilide  (TIKOSYN )  500 MCG capsule Take 1 capsule (500 mcg total) by mouth 2 (two) times daily. 01/25/22   Tylene Galla, PA-C  finasteride  (PROSCAR ) 5 MG tablet Take 5 mg by mouth daily. 05/24/16   [provider]  gabapentin (NEURONTIN) 300 MG capsule Take 300 mg by mouth at bedtime as needed (pain). 02/28/22   [provider]  HYDROcodone -acetaminophen  (NORCO) 10-325 MG tablet Take 1 tablet by mouth 4 (four) times daily as needed for moderate pain (pain score 4-6).    [provider]  losartan  (COZAAR ) 100 MG tablet Take 100 mg by mouth daily.    [provider]  meclizine  (ANTIVERT ) 25 MG tablet Take 25 mg by mouth 3 (three) times daily as needed for dizziness.    [provider]  metoprolol  tartrate (LOPRESSOR ) 25 MG tablet Take 1 tablet (25 mg total) by mouth 2 (two) times daily. 01/09/24   Parcells, Cyrilla Drivers, PA-C  Omega-3 Fatty Acids (FISH OIL  ULTRA) 1400 MG CAPS Take 1,400 mg by mouth daily.    [provider]  pantoprazole  (PROTONIX ) 40 MG tablet Take 1 tablet (40 mg total) by mouth daily. 01/10/24   Parcells, Cyrilla Drivers, PA-C  potassium chloride  SA (KLOR-CON ) 20 MEQ tablet Take 20 mEq by mouth daily. 03/30/21   [provider]  pravastatin  (PRAVACHOL ) 40 MG tablet Take 40 mg by mouth at bedtime.    [provider]  Vitamin D , Ergocalciferol , (DRISDOL ) 50000 units CAPS capsule Take 50,000 Units by mouth every Sunday.     [provider]    Family History    Family History  Problem Relation Age of Onset   Clotting disorder Father    Clotting disorder Sister    He indicated that the status of his father is unknown. He indicated that the status of his sister is unknown. He indicated that his maternal grandmother is deceased. He indicated that his maternal grandfather is deceased. He indicated that his paternal grandmother is deceased. He indicated that his paternal grandfather is deceased.  Social History    Social  History   Socioeconomic History   Marital status: Married    Spouse name: Not on file   Number of children: Not on file   Years of education: Not on file   Highest education level: Not on file  Occupational History   Not on file  Tobacco Use   Smoking status: Former    Current packs/day: 0.00    Types: Cigarettes    Start date: 28    Quit date: 1991    Years since quitting: 53.4  Passive exposure: Never   Smokeless tobacco: Never  Vaping Use   Vaping status: Never Used  Substance and Sexual Activity   Alcohol use: Not Currently   Drug use: No   Sexual activity: Not Currently  Other Topics Concern   Not on file  Social History Narrative   Not on file   Social Drivers of Health   Financial Resource Strain: Not on file  Food Insecurity: No Food Insecurity (01/07/2024)   Hunger Vital Sign    Worried About Running Out of Food in the Last Year: Never true    Ran Out of Food in the Last Year: Never true  Transportation Needs: No Transportation Needs (01/07/2024)   PRAPARE - Administrator, Civil Service (Medical): No    Lack of Transportation (Non-Medical): No  Physical Activity: Not on file  Stress: Not on file  Social Connections: Moderately Integrated (01/07/2024)   Social Connection and Isolation Panel [NHANES]    Frequency of Communication with Friends and Family: More than three times a week    Frequency of Social Gatherings with Friends and Family: More than three times a week    Attends Religious Services: 1 to 4 times per year    Active Member of [employer] or Organizations: Yes    Attends Banker Meetings: 1 to 4 times per year    Marital Status: Widowed  Intimate Partner Violence: Not At Risk (01/07/2024)   Humiliation, Afraid, Rape, and Kick questionnaire    Fear of Current or Ex-Partner: No    Emotionally Abused: No    Physically Abused: No    Sexually Abused: No     Review of Systems    General:  No chills, fever, night sweats or  weight changes.  Cardiovascular:  No chest pain, dyspnea on exertion, edema, orthopnea, palpitations, paroxysmal nocturnal dyspnea. Dermatological: No rash, lesions/masses Respiratory: No cough, dyspnea Urologic: No hematuria, dysuria Abdominal:   No nausea, vomiting, diarrhea, bright red blood per rectum, melena, or hematemesis Neurologic:  No visual changes, wkns, changes in mental status. All other systems reviewed and are otherwise negative except as noted above.  Physical Exam    VS:  BP 118/80   Pulse 78   Ht 5\' 8"  (1.727 m)   Wt 191 lb 9.6 oz (86.9 kg)   SpO2 96%   BMI 29.13 kg/m  , BMI Body mass index is 29.13 kg/m. GEN: Well nourished, well developed, in no acute distress. HEENT: normal. Neck: Supple, no JVD, carotid bruits, or masses. Cardiac: RRR, no murmurs, rubs, or gallops. No clubbing, cyanosis, edema.  Radials/DP/PT 2+ and equal bilaterally.  Right forearm pseudoaneurysm with no bruit Respiratory:  Respirations regular and unlabored, clear to auscultation bilaterally. GI: Soft, nontender, nondistended, BS + x 4. MS: no deformity or atrophy. Skin: warm and dry, no rash.   Neuro:  Strength and sensation are intact. Psych: Normal affect.  Accessory Clinical Findings    Recent Labs: 01/09/2024: BUN 18; Creatinine, Ser 0.93; Hemoglobin 12.2; Magnesium  2.0; Platelets 209; Potassium 3.8; Sodium 140   Recent Lipid Panel No results found for: "CHOL", "TRIG", "HDL", "CHOLHDL", "VLDL", "LDLCALC", "LDLDIRECT"       ECG personally reviewed by me today-none today.  Echocardiogram 01/15/2024 IMPRESSIONS     1. Rhythm strip during this exam demonstrates sinus versus ectopic atrial  bradycardia with heart rate in 40's.   2. Left ventricular ejection fraction, by estimation, is 55 to 60%. The  left ventricle has normal function. The  left ventricle has no regional  wall motion abnormalities.   3. Right ventricular systolic function is normal. The right ventricular  size  is normal.   4. There is normal pulmonary artery systolic pressure. The estimated  right ventricular systolic pressure is 30.5 mmHg.   5. The mitral valve is degenerative. Trivial mitral valve regurgitation.  No evidence of mitral stenosis.   6. The aortic valve is tricuspid. There is moderate thickening of the  aortic valve. Aortic valve regurgitation is not visualized. No aortic  stenosis is present.   7. The inferior vena cava is normal in size with greater than 50%  respiratory variability, suggesting right atrial pressure of 3 mmHg.   Comparison(s): Echocardiogram done 07/12/18 reported an LVEF of 60-65%.   FINDINGS   Left Ventricle: Left ventricular ejection fraction, by estimation, is 55  to 60%. The left ventricle has normal function. The left ventricle has no  regional wall motion abnormalities. The left ventricular internal cavity  size was normal in size. There is   no left ventricular hypertrophy.   Right Ventricle: The right ventricular size is normal. Right vetricular  wall thickness was not well visualized. Right ventricular systolic  function is normal. There is normal pulmonary artery systolic pressure.  The tricuspid regurgitant velocity is 2.62  m/s, and with an assumed right atrial pressure of 3 mmHg, the estimated  right ventricular systolic pressure is 30.5 mmHg.   Left Atrium: Left atrial size was normal in size.   Right Atrium: Right atrial size was normal in size.   Pericardium: There is no evidence of pericardial effusion.   Mitral Valve: The mitral valve is degenerative in appearance. Mild mitral  annular calcification. Trivial mitral valve regurgitation. No evidence of  mitral valve stenosis.   Tricuspid Valve: The tricuspid valve is grossly normal. Tricuspid valve  regurgitation is mild . No evidence of tricuspid stenosis.   Aortic Valve: The aortic valve is tricuspid. There is moderate thickening  of the aortic valve. Aortic valve regurgitation is  not visualized. No  aortic stenosis is present. Aortic valve mean gradient measures 7.0 mmHg.  Aortic valve peak gradient measures  14.0 mmHg. Aortic valve area, by VTI measures 1.67 cm.   Pulmonic Valve: The pulmonic valve was grossly normal. Pulmonic valve  regurgitation is trivial. No evidence of pulmonic stenosis.   Aorta: The aortic root and ascending aorta are structurally normal, with  no evidence of dilitation.   Venous: The inferior vena cava is normal in size with greater than 50%  respiratory variability, suggesting right atrial pressure of 3 mmHg.   IAS/Shunts: The interatrial septum was not well visualized.     Cardiac catheterization 01/08/2024  Successful orbital atherectomy and stenting of critical 99% calcific stenosis in the proximal left circumflex, lesion stented with a 2.75 x 16 mm Synergy DES   Recommendations: Aspirin  81 mg daily x 30 days Clopidogrel  75 mg daily times at least 6 months Resume apixaban  5 mg twice daily tomorrow  Diagnostic Dominance: Right  Intervention       Assessment & Plan   1.  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  According to Mr. Clauss he had forearm ultrasound completed at [facility].  I am not able to see results and will request these.  He has no bruit today.  2+ right radial pulse. Continue current medical therapy  Coronary artery disease-no chest pain today.  Underwent successful orbital arthrectomy 01/08/2024.  Groin and right forearm healing well.  No signs of infection.  Ecchymosis no swelling. Heart healthy low-sodium diet Plan to continue aspirin  x 30 days, Plavix , apixaban , pravastatin , metoprolol  Heart healthy low-sodium diet  Atrial fibrillation-heart rate today 78.  Continues to deny  bleeding issues.  Compliant with apixaban . Continue metoprolol , apixaban  Avoid triggers caffeine, chocolate, EtOH, dehydration etc.  Hyperlipidemia-reports compliance with statin therapy and  omega-3's. High-fiber diet Continue pravastatin , omega-3 fatty acids Fasting lipids and lfts  Essential hypertension-BP today 118/80 Continue losartan , metoprolol  Maintain blood pressure log    Disposition: Follow-up with Dr. Katheryne Pane or me in 6 months.   Chet Cota. Jonuel Butterfield NP-C     04/04/2024, 9:34 AM [facility] [address] Office [phone] Fax [phone]    I spent 14 minutes examining this patient, reviewing medications, and using patient centered shared decision making involving their cardiac care.   I spent  20 minutes reviewing past medical history,  medications, and prior cardiac tests.

## 2024-04-04 ENCOUNTER — Ambulatory Visit: Attending: General Practice | Admitting: General Practice

## 2024-04-04 ENCOUNTER — Encounter: Payer: Self-pay | Admitting: General Practice

## 2024-04-04 VITALS — BP 118/80 | HR 78 | Ht 68.0 in | Wt 191.6 lb

## 2024-04-04 DIAGNOSIS — T81718D Complication of other artery following a procedure, not elsewhere classified, subsequent encounter: Secondary | ICD-10-CM | POA: Diagnosis not present

## 2024-04-04 DIAGNOSIS — I2510 Atherosclerotic heart disease of native coronary artery without angina pectoris: Secondary | ICD-10-CM

## 2024-04-04 DIAGNOSIS — I10 Essential (primary) hypertension: Secondary | ICD-10-CM

## 2024-04-04 DIAGNOSIS — E785 Hyperlipidemia, unspecified: Secondary | ICD-10-CM

## 2024-04-04 DIAGNOSIS — R0989 Other specified symptoms and signs involving the circulatory and respiratory systems: Secondary | ICD-10-CM

## 2024-04-04 DIAGNOSIS — I729 Aneurysm of unspecified site: Secondary | ICD-10-CM

## 2024-04-04 DIAGNOSIS — T81718A Complication of other artery following a procedure, not elsewhere classified, initial encounter: Secondary | ICD-10-CM

## 2024-04-04 DIAGNOSIS — I480 Paroxysmal atrial fibrillation: Secondary | ICD-10-CM | POA: Diagnosis not present

## 2024-04-04 NOTE — Patient Instructions (Addendum)
 Medication Instructions:  No medication changes were made during today's visit.   You may visit our pharmacy to purchase the Claritin needed for your allergy symptoms.  *If you need a refill on your cardiac medications before your next appointment, please call your pharmacy*   Lab Work: No labs were ordered during today's visit.  If you have labs (blood work) drawn today and your tests are completely normal, you will receive your results only by: MyChart Message (if you have MyChart) OR A paper copy in the mail If you have any lab test that is abnormal or we need to change your treatment, we will call you to review the results.   Testing/Procedures: No procedures were ordered during today's visit.    Follow-Up: At [facility], you and your health needs are our priority.  As part of our continuing mission to provide you with exceptional heart care, we have created designated Provider Care Teams.  These Care Teams include your primary Cardiologist (physician) and Advanced Practice Providers (APPs -  Physician Assistants and Nurse Practitioners) who all work together to provide you with the care you need, when you need it.  We recommend signing up for the patient portal called "MyChart".  Sign up information is provided on this After Visit Summary.  MyChart is used to connect with patients for Virtual Visits (Telemedicine).  Patients are able to view lab/test results, encounter notes, upcoming appointments, etc.  Non-urgent messages can be sent to your provider as well.   To learn more about what you can do with MyChart, go to [email or web address].    Your next appointment:   6 month(s)  Provider:   Lauro Portal, MD    Other Instructions Thank you for choosing [facility]!

## 2024-04-05 LAB — LIPID PANEL
Chol/HDL Ratio: 3 ratio (ref 0.0–5.0)
Cholesterol, Total: 146 mg/dL (ref 100–199)
HDL: 49 mg/dL (ref >39)
LDL Chol Calc (NIH): 81 mg/dL (ref 0–99)
Triglycerides: 84 mg/dL (ref 0–149)
VLDL Cholesterol Cal: 16 mg/dL (ref 5–40)

## 2024-04-05 LAB — HEPATIC FUNCTION PANEL
ALT: 12 IU/L (ref 0–44)
AST: 20 IU/L (ref 0–40)
Albumin: 4.4 g/dL (ref 3.7–4.7)
Alkaline Phosphatase: 49 IU/L (ref 44–121)
Bilirubin Total: 0.9 mg/dL (ref 0.0–1.2)
Bilirubin, Direct: 0.28 mg/dL (ref 0.00–0.40)
Total Protein: 6.6 g/dL (ref 6.0–8.5)

## 2024-04-06 ENCOUNTER — Ambulatory Visit: Payer: Self-pay | Admitting: General Practice

## 2024-04-06 DIAGNOSIS — E7800 Pure hypercholesterolemia, unspecified: Secondary | ICD-10-CM

## 2024-04-06 DIAGNOSIS — E785 Hyperlipidemia, unspecified: Secondary | ICD-10-CM

## 2024-04-10 MED ORDER — ATORVASTATIN CALCIUM 40 MG PO TABS: 40.0000 mg | ORAL_TABLET | Freq: Every day | ORAL | 3 refills | Status: AC

## 2024-04-10 NOTE — Telephone Encounter (Signed)
 Spoke with pt, aware of results and recommendations. New script sent to the pharmacy  Lab orders mailed to the pt

## 2024-04-10 NOTE — Telephone Encounter (Signed)
-----   Message from Carie Charity sent at 04/06/2024  9:19 PM EDT ----- Please contact Kevin Gonzales and let him know that his lipid panel has been reviewed.  His LDL cholesterol was noted to be 81.  His total cholesterol was 186 and his triglycerides were noted to be 84.  He is not quite at goal for his LDL cholesterol.  His liver panel was within normal limits.  I will discontinue his pravastatin  and start him on atorvastatin 40 mg daily.  We will repeat his fasting lipids and LFTs in 8 weeks.  Thank you.

## 2024-04-11 ENCOUNTER — Other Ambulatory Visit (HOSPITAL_COMMUNITY)

## 2024-05-02 ENCOUNTER — Ambulatory Visit (HOSPITAL_COMMUNITY): Admission: RE | Admit: 2024-05-02 | Source: Ambulatory Visit

## 2024-05-05 ENCOUNTER — Ambulatory Visit (HOSPITAL_COMMUNITY)
Admission: RE | Admit: 2024-05-05 | Discharge: 2024-05-05 | Disposition: A | Discharge status: Home / Self Care | Source: Ambulatory Visit | Attending: Cardiovascular Disease | Admitting: Cardiovascular Disease

## 2024-05-05 DIAGNOSIS — R0989 Other specified symptoms and signs involving the circulatory and respiratory systems: Secondary | ICD-10-CM

## 2024-05-05 DIAGNOSIS — T81718D Complication of other artery following a procedure, not elsewhere classified, subsequent encounter: Secondary | ICD-10-CM

## 2024-05-05 DIAGNOSIS — I728 Aneurysm of other specified arteries: Secondary | ICD-10-CM | POA: Diagnosis not present

## 2024-05-05 DIAGNOSIS — T81718A Complication of other artery following a procedure, not elsewhere classified, initial encounter: Secondary | ICD-10-CM | POA: Insufficient documentation

## 2024-05-05 DIAGNOSIS — I729 Aneurysm of unspecified site: Secondary | ICD-10-CM | POA: Insufficient documentation

## 2024-06-06 LAB — LIPID PANEL
Chol/HDL Ratio: 2.4 ratio (ref 0.0–5.0)
Cholesterol, Total: 110 mg/dL (ref 100–199)
HDL: 45 mg/dL (ref >39)
LDL Chol Calc (NIH): 52 mg/dL (ref 0–99)
Triglycerides: 59 mg/dL (ref 0–149)
VLDL Cholesterol Cal: 13 mg/dL (ref 5–40)

## 2024-06-06 LAB — HEPATIC FUNCTION PANEL
ALT: 15 IU/L (ref 0–44)
AST: 27 IU/L (ref 0–40)
Albumin: 4 g/dL (ref 3.7–4.7)
Alkaline Phosphatase: 51 IU/L (ref 44–121)
Bilirubin Total: 0.9 mg/dL (ref 0.0–1.2)
Bilirubin, Direct: 0.32 mg/dL (ref 0.00–0.40)
Total Protein: 6.3 g/dL (ref 6.0–8.5)

## 2024-06-16 ENCOUNTER — Other Ambulatory Visit (HOSPITAL_COMMUNITY): Payer: Self-pay | Admitting: Nurse Practitioner

## 2024-06-16 DIAGNOSIS — I739 Peripheral vascular disease, unspecified: Secondary | ICD-10-CM

## 2024-07-31 ENCOUNTER — Other Ambulatory Visit: Payer: Self-pay

## 2024-07-31 DIAGNOSIS — I6523 Occlusion and stenosis of bilateral carotid arteries: Secondary | ICD-10-CM

## 2024-09-03 ENCOUNTER — Ambulatory Visit: Admitting: Physician Assistant

## 2024-09-03 ENCOUNTER — Ambulatory Visit (HOSPITAL_COMMUNITY)
Admission: RE | Admit: 2024-09-03 | Discharge: 2024-09-03 | Disposition: A | Discharge status: Home / Self Care | Source: Ambulatory Visit | Attending: Vascular Surgery | Admitting: Vascular Surgery

## 2024-09-03 VITALS — BP 193/67 | HR 55 | Temp 98.1°F | Wt 190.5 lb

## 2024-09-03 DIAGNOSIS — M79605 Pain in left leg: Secondary | ICD-10-CM | POA: Diagnosis not present

## 2024-09-03 DIAGNOSIS — I6523 Occlusion and stenosis of bilateral carotid arteries: Secondary | ICD-10-CM | POA: Diagnosis not present

## 2024-09-03 NOTE — Progress Notes (Signed)
 Office Note     CC:  follow up Requesting Provider:  Dorena Fernando HERO, MD  HPI: Kevin Gonzales is a 83 y.o. ([birth year]) male who presents for surveillance of carotid artery stenosis.  He underwent right carotid endarterectomy by Dr. Oris in 2019 due to high-grade asymptomatic stenosis.  He then underwent left carotid endarterectomy in 2022 by Dr. Oris due to symptomatic carotid stenosis causing amaurosis fugax.  Vision changes resolved after the surgery.  He denies any strokelike symptoms since last office visit including slurring speech, changes in vision, or one-sided weakness.  He is on a statin daily.  He takes Eliquis  for atrial fibrillation.  He was evaluated by his PCP due to cramping in his left leg at night.  Patient believes he is here today to evaluate his circulation in addition to his carotid artery stenosis.  I do not have any record of any imaging studies on his left leg which is the site of interest.  He denies any traditional claudication symptoms.  He is also without any rest pain or tissue loss.   Past Medical History:  Diagnosis Date   Abdominal obesity    Alcohol abuse    Arthritis    Atrial fibrillation (HCC)    Carotid artery occlusion    Carotid bruit    Chest pain    Chondrodermatitis nodularis helicis, left    Coronary artery disease    Dyspnea    GERD (gastroesophageal reflux disease)    HOH (hard of hearing)    Hyperlipidemia    Hypertension    Myocardial infarction (HCC) 1991   Thyroid  disease    Vitamin D  deficiency     Past Surgical History:  Procedure Laterality Date   CARDIOVERSION N/A 07/25/2018   Procedure: CARDIOVERSION;  Surgeon: Francyne Headland, MD;  Location: [facility];  Service: Cardiovascular;  Laterality: N/A;   CAROTID ENDARTERECTOMY Right 03/29/2018   CAROTID STENT  2004   CORONARY ARTERY BYPASS GRAFT     1 vessel   CORONARY ATHERECTOMY N/A 01/08/2024   Procedure: CORONARY ATHERECTOMY;  Surgeon: Wonda Sharper, MD;   Location: [facility] INVASIVE CV LAB;  Service: Cardiovascular;  Laterality: N/A;   CORONARY STENT INTERVENTION N/A 01/08/2024   Procedure: CORONARY STENT INTERVENTION;  Surgeon: Wonda Sharper, MD;  Location: [facility] INVASIVE CV LAB;  Service: Cardiovascular;  Laterality: N/A;   ENDARTERECTOMY Right 03/29/2018   Procedure: Right Carotid Endartarectomy;  Surgeon: Oris Krystal FALCON, MD;  Location: [facility] OR;  Service: Vascular;  Laterality: Right;   ENDARTERECTOMY Left 05/03/2021   Procedure: LEFT CAROTID ENDARTERECTOMY WITH PATCH ANGIOPLASTY;  Surgeon: Oris Krystal FALCON, MD;  Location: [facility] OR;  Service: Vascular;  Laterality: Left;   LEFT HEART CATH AND CORS/GRAFTS ANGIOGRAPHY N/A 05/10/2017   Procedure: Left Heart Cath and Cors/Grafts Angiography;  Surgeon: Court Dorn JINNY, MD;  Location: [facility] INVASIVE CV LAB;  Service: Cardiovascular;  Laterality: N/A;   LEFT HEART CATH AND CORS/GRAFTS ANGIOGRAPHY N/A 01/07/2024   Procedure: LEFT HEART CATH AND CORS/GRAFTS ANGIOGRAPHY;  Surgeon: Swaziland, Peter M, MD;  Location: [facility] INVASIVE CV LAB;  Service: Cardiovascular;  Laterality: N/A;   PATCH ANGIOPLASTY Right 03/29/2018   Procedure: PATCH ANGIOPLASTY;  Surgeon: Oris Krystal FALCON, MD;  Location: [facility] OR;  Service: Vascular;  Laterality: Right;    Social History   Socioeconomic History   Marital status: Married    Spouse name: Not on file   Number of children: Not on file   Years of education: Not on file  Highest education level: Not on file  Occupational History   Not on file  Tobacco Use   Smoking status: Former    Current packs/day: 0.00    Types: Cigarettes    Start date: 13    Quit date: 23    Years since quitting: 34.8    Passive exposure: Never   Smokeless tobacco: Never  Vaping Use   Vaping status: Never Used  Substance and Sexual Activity   Alcohol use: Not Currently   Drug use: No   Sexual activity: Not Currently  Other Topics Concern   Not on file  Social History Narrative   Not on file   Social  Drivers of Health   Financial Resource Strain: Not on file  Food Insecurity: No Food Insecurity (01/07/2024)   Hunger Vital Sign    Worried About Running Out of Food in the Last Year: Never true    Ran Out of Food in the Last Year: Never true  Transportation Needs: No Transportation Needs (01/07/2024)   PRAPARE - Administrator, Civil Service (Medical): No    Lack of Transportation (Non-Medical): No  Physical Activity: Not on file  Stress: Not on file  Social Connections: Moderately Integrated (01/07/2024)   Social Connection and Isolation Panel    Frequency of Communication with Friends and Family: More than three times a week    Frequency of Social Gatherings with Friends and Family: More than three times a week    Attends Religious Services: 1 to 4 times per year    Active Member of [employer] or Organizations: Yes    Attends Banker Meetings: 1 to 4 times per year    Marital Status: Widowed  Intimate Partner Violence: Not At Risk (01/07/2024)   Humiliation, Afraid, Rape, and Kick questionnaire    Fear of Current or Ex-Partner: No    Emotionally Abused: No    Physically Abused: No    Sexually Abused: No    Family History  Problem Relation Age of Onset   Clotting disorder Father    Clotting disorder Sister     Current Outpatient Medications  Medication Sig Dispense Refill   apixaban  (ELIQUIS ) 5 MG TABS tablet Take 1 tablet (5 mg total) by mouth 2 (two) times daily. 180 tablet 3   atorvastatin  (LIPITOR) 40 MG tablet Take 1 tablet (40 mg total) by mouth daily. 90 tablet 3   beclomethasone (QVAR) 80 MCG/ACT inhaler Inhale 1 puff into the lungs 2 (two) times daily as needed (for shortness of breath).      clopidogrel  (PLAVIX ) 75 MG tablet Take 1 tablet (75 mg total) by mouth daily. 90 tablet 3   cyanocobalamin (VITAMIN B12) 1000 MCG tablet Take 1,000 mcg by mouth daily.     dofetilide  (TIKOSYN ) 500 MCG capsule Take 1 capsule (500 mcg total) by mouth 2 (two)  times daily. 180 capsule 3   finasteride  (PROSCAR ) 5 MG tablet Take 5 mg by mouth daily.     gabapentin (NEURONTIN) 300 MG capsule Take 300 mg by mouth at bedtime as needed (pain).     HYDROcodone -acetaminophen  (NORCO) 10-325 MG tablet Take 1 tablet by mouth 4 (four) times daily as needed for moderate pain (pain score 4-6).     losartan  (COZAAR ) 100 MG tablet Take 1 tablet (100 mg total) by mouth daily. 90 tablet 3   meclizine  (ANTIVERT ) 25 MG tablet Take 25 mg by mouth 3 (three) times daily as needed for dizziness.  metoprolol  tartrate (LOPRESSOR ) 25 MG tablet Take 1 tablet (25 mg total) by mouth 2 (two) times daily. 180 tablet 3   Omega-3 Fatty Acids (FISH OIL  ULTRA) 1400 MG CAPS Take 1,400 mg by mouth daily.     pantoprazole  (PROTONIX ) 40 MG tablet Take 1 tablet (40 mg total) by mouth daily. 90 tablet 3   potassium chloride  SA (KLOR-CON  M) 20 MEQ tablet Take 1 tablet (20 mEq total) by mouth daily. 90 tablet 3   Vitamin D , Ergocalciferol , (DRISDOL ) 50000 units CAPS capsule Take 50,000 Units by mouth every Sunday.      No current facility-administered medications for this visit.    Allergies  Allergen Reactions   Perflutren  Lipid Microsphere Other (See Comments)    Severe lower back, tailbone, and butt pain. Minor chest pain     REVIEW OF SYSTEMS:  Negative unless noted in HPI [X]  denotes positive finding, [ ]  denotes negative finding Cardiac  Comments:  Chest pain or chest pressure:    Shortness of breath upon exertion:    Short of breath when lying flat:    Irregular heart rhythm:        Vascular    Pain in calf, thigh, or hip brought on by ambulation:    Pain in feet at night that wakes you up from your sleep:     Blood clot in your veins:    Leg swelling:         Pulmonary    Oxygen at home:    Productive cough:     Wheezing:         Neurologic    Sudden weakness in arms or legs:     Sudden numbness in arms or legs:     Sudden onset of difficulty speaking or  slurred speech:    Temporary loss of vision in one eye:     Problems with dizziness:         Gastrointestinal    Blood in stool:     Vomited blood:         Genitourinary    Burning when urinating:     Blood in urine:        Psychiatric    Major depression:         Hematologic    Bleeding problems:    Problems with blood clotting too easily:        Skin    Rashes or ulcers:        Constitutional    Fever or chills:      PHYSICAL EXAMINATION:  Vitals:   09/03/24 1403 09/03/24 1405  BP: (!) 192/84 (!) 193/67  Pulse: (!) 53 (!) 55  Temp: 98.1 F (36.7 C)   TempSrc: Temporal   Weight: 190 lb 8 oz (86.4 kg)     General:  WDWN in NAD; vital signs documented above Gait: Not observed HENT: WNL, normocephalic Pulmonary: normal non-labored breathing Cardiac: irregular HR Abdomen: soft, NT, no masses Skin: without rashes Vascular Exam/Pulses: Symmetrical radial pulses; palpable left DP pulse Extremities: without ischemic changes, without Gangrene , without cellulitis; without open wounds;  Musculoskeletal: no muscle wasting or atrophy  Neurologic: A&O X 3; cranial nerves grossly intact Psychiatric:  The pt has Normal affect.   Non-Invasive Vascular Imaging:   Right ICA 1 to 39% stenosis Left ICA 1 to 39% stenosis    ASSESSMENT/PLAN:: 83 y.o. male here for follow up for surveillance of carotid artery stenosis.  Patient also wanted to evaluate circulation in  in his left lower extremity  Carotid duplex is stable demonstrating 1 to 39% stenosis of bilateral internal carotid arteries.  He has history of carotid endarterectomy bilaterally.  No evidence of recurrent stenosis.  We will repeat carotid duplex in another year.  He can continue his statin daily.  I do not see any record of a referral for PAD or evaluation of circulation of his left lower extremity however he is complaining of cramping in his left calf at night.  He denies any traditional claudication symptoms.   He is without any tissue loss or rest pain of bilateral lower extremities.  On exam he has a palpable left DP pulse which rules out arterial insufficiency.  Etiology of the left calf cramping at night is unrelated to circulation.  We will check an ABI at the same time as his carotid duplex in 1 year.   Donnice Sender, PA-C [facility] [phone]  Clinic MD:   Sheree

## 2024-09-04 ENCOUNTER — Encounter (HOSPITAL_BASED_OUTPATIENT_CLINIC_OR_DEPARTMENT_OTHER): Payer: Self-pay | Admitting: Emergency Medicine

## 2024-09-04 ENCOUNTER — Ambulatory Visit (HOSPITAL_BASED_OUTPATIENT_CLINIC_OR_DEPARTMENT_OTHER): Admission: EM | Admit: 2024-09-04 | Discharge: 2024-09-04 | Disposition: A | Discharge status: Home / Self Care

## 2024-09-04 DIAGNOSIS — I10 Essential (primary) hypertension: Secondary | ICD-10-CM | POA: Diagnosis not present

## 2024-09-04 NOTE — ED Provider Notes (Signed)
 [facility]    CSN: [id number] Arrival date & time: 09/04/24  1004      History   Chief Complaint No chief complaint on file.   HPI KAYDEN HUTMACHER is a 83 y.o. male.   Patient is an 83 year old male with past medical history of A-fib, CAD, hyperlipidemia, hypertension, MI.  He presents today with elevated blood pressure readings.  This started yesterday.  Was seen by his vascular surgeon yesterday with readings in the 190s systolic at office.  Reporting same readings at home.  Has been taking his blood pressure medication.  Pt reports his cardio and pcp have been trying to adjust his medication for his blood pressure. Pt had stent surgery in March and ever since pt has been having blood pressure issues. Pt is currently taking Metoprolol , Tikosyn  and Losartan .  Denies any chest pain, shortness of breath, dizziness, lightheadedness or any other concerning signs or symptoms.      Past Medical History:  Diagnosis Date   Abdominal obesity    Alcohol abuse    Arthritis    Atrial fibrillation (HCC)    Carotid artery occlusion    Carotid bruit    Chest pain    Chondrodermatitis nodularis helicis, left    Coronary artery disease    Dyspnea    GERD (gastroesophageal reflux disease)    HOH (hard of hearing)    Hyperlipidemia    Hypertension    Myocardial infarction (HCC) 1991   Thyroid  disease    Vitamin D  deficiency     Patient Active Problem List   Diagnosis Date Noted   Hematoma of circulatory system after cardiac catheterization 01/09/2024   Dyspnea on exertion 01/08/2024   Unstable angina (HCC) 01/07/2024   Spinal stenosis of lumbar region 12/22/2021   Chronic left-sided low back pain with left-sided sciatica 12/01/2021   DDD (degenerative disc disease), lumbar 12/01/2021   Sinus node dysfunction (HCC) 10/01/2019   Visit for monitoring Tikosyn  therapy 08/12/2018   Persistent atrial fibrillation (HCC)    PAF (paroxysmal atrial fibrillation) (HCC) 04/17/2018    Carotid artery stenosis, asymptomatic, right 03/29/2018   Carotid artery stenosis 03/19/2018   Disequilibrium 06/14/2017   Chest pain    Essential hypertension 05/02/2017   GERD (gastroesophageal reflux disease) 04/30/2017   History of infectious diarrhea 08/18/2016   Acute myocardial infarction (HCC) 07/01/2014   Allergic rhinitis 07/01/2014   Benign prostatic hyperplasia 07/01/2014   BPPV (benign paroxysmal positional vertigo) 07/01/2014   Coronary artery disease 07/01/2014   Esophageal reflux 07/01/2014   Hearing loss 07/01/2014   Tinnitus of both ears 07/01/2014   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   Hyperlipidemia with target LDL less than 70 07/01/2014   PSA elevation 04/29/2014   Hypertrophy of prostate with urinary obstruction and other lower urinary tract symptoms (LUTS) 04/29/2014    Past Surgical History:  Procedure Laterality Date   CARDIOVERSION N/A 07/25/2018   Procedure: CARDIOVERSION;  Surgeon: Francyne Headland, MD;  Location: [facility] ENDOS[facility];  Service: Cardiovascular;  Laterality: N/A;   CAROTID ENDARTERECTOMY Right 03/29/2018   CAROTID STENT  2004   CORONARY ARTERY BYPASS GRAFT     1 vessel   CORONARY ATHERECTOMY N/A 01/08/2024   Procedure: CORONARY ATHERECTOMY;  Surgeon: Wonda Sharper, MD;  Location: [facility] INVASIVE CV LAB;  Service: Cardiovascular;  Laterality: N/A;   CORONARY STENT INTERVENTION N/A 01/08/2024   Procedure: CORONARY STENT INTERVENTION;  Surgeon: Wonda Sharper, MD;  Location: [facility] INVASIVE CV  LAB;  Service: Cardiovascular;  Laterality: N/A;   ENDARTERECTOMY Right 03/29/2018   Procedure: Right Carotid Endartarectomy;  Surgeon: Oris Krystal FALCON, MD;  Location: [facility] OR;  Service: Vascular;  Laterality: Right;   ENDARTERECTOMY Left 05/03/2021   Procedure: LEFT CAROTID ENDARTERECTOMY WITH PATCH ANGIOPLASTY;  Surgeon: Oris Krystal FALCON, MD;  Location: [facility] OR;  Service: Vascular;  Laterality: Left;   LEFT HEART CATH AND  CORS/GRAFTS ANGIOGRAPHY N/A 05/10/2017   Procedure: Left Heart Cath and Cors/Grafts Angiography;  Surgeon: Court Dorn PARAS, MD;  Location: [facility] INVASIVE CV LAB;  Service: Cardiovascular;  Laterality: N/A;   LEFT HEART CATH AND CORS/GRAFTS ANGIOGRAPHY N/A 01/07/2024   Procedure: LEFT HEART CATH AND CORS/GRAFTS ANGIOGRAPHY;  Surgeon: Swazil[place], Peter M, MD;  Location: [facility] INVASIVE CV LAB;  Service: Cardiovascular;  Laterality: N/A;   PATCH ANGIOPLASTY Right 03/29/2018   Procedure: PATCH ANGIOPLASTY;  Surgeon: Oris Krystal FALCON, MD;  Location: [facility] OR;  Service: Vascular;  Laterality: Right;       Home Medications    Prior to Admission medications   Medication Sig Start Date End Date Taking? Authorizing Provider  atorvastatin  (LIPITOR) 40 MG tablet Take 1 tablet (40 mg total) by mouth daily. 04/10/24 09/04/24 Yes Cleaver, Josefa HERO, NP  clopidogrel  (PLAVIX ) 75 MG tablet Take 1 tablet (75 mg total) by mouth daily. 01/22/24  Yes Cleaver, Josefa HERO, NP  finasteride  (PROSCAR ) 5 MG tablet Take 5 mg by mouth daily. 05/24/16  Yes [provider]  losartan  (COZAAR ) 100 MG tablet Take 1 tablet (100 mg total) by mouth daily. 01/22/24  Yes Cleaver, Josefa HERO, NP  metoprolol  tartrate (LOPRESSOR ) 25 MG tablet Take 1 tablet (25 mg total) by mouth 2 (two) times daily. 02/04/24  Yes Emelia Josefa HERO, NP  pantoprazole  (PROTONIX ) 40 MG tablet Take 1 tablet (40 mg total) by mouth daily. 01/22/24  Yes Cleaver, Josefa HERO, NP  potassium chloride  SA (KLOR-CON  M) 20 MEQ tablet Take 1 tablet (20 mEq total) by mouth daily. 01/22/24  Yes Emelia Josefa HERO, NP  Vitamin D , Ergocalciferol , (DRISDOL ) 50000 units CAPS capsule Take 50,000 Units by mouth every Sunday.    Yes [provider]  apixaban  (ELIQUIS ) 5 MG TABS tablet Take 1 tablet (5 mg total) by mouth 2 (two) times daily. 01/22/24   Emelia Josefa HERO, NP  beclomethasone (QVAR) 80 MCG/ACT inhaler Inhale 1 puff into the lungs 2 (two) times daily as needed (for shortness of breath).      [provider]  cyanocobalamin (VITAMIN B12) 1000 MCG tablet Take 1,000 mcg by mouth daily.    [provider]  dofetilide  (TIKOSYN ) 500 MCG capsule Take 1 capsule (500 mcg total) by mouth 2 (two) times daily. 01/22/24   Emelia Josefa HERO, NP  gabapentin (NEURONTIN) 300 MG capsule Take 300 mg by mouth at bedtime as needed (pain). 02/28/22   [provider]  HYDROcodone -acetaminophen  (NORCO) 10-325 MG tablet Take 1 tablet by mouth 4 (four) times daily as needed for moderate pain (pain score 4-6).    [provider]  meclizine  (ANTIVERT ) 25 MG tablet Take 25 mg by mouth 3 (three) times daily as needed for dizziness.    [provider]  Omega-3 Fatty Acids (FISH OIL  ULTRA) 1400 MG CAPS Take 1,400 mg by mouth daily.    [provider]    Family History Family History  Problem Relation Age of Onset   Clotting disorder Father    Clotting disorder Sister     Social History  Social History   Tobacco Use   Smoking status: Former    Current packs/day: 0.00    Types: Cigarettes    Start date: 50    Quit date: 1991    Years since quitting: 34.8    Passive exposure: Never   Smokeless tobacco: Never  Vaping Use   Vaping status: Never Used  Substance Use Topics   Alcohol use: Not Currently   Drug use: No     Allergies   Perflutren  lipid microsphere   Review of Systems Review of Systems  See HPI Physical Exam Triage Vital Signs ED Triage Vitals  Encounter Vitals Group     BP 09/04/24 1017 (!) 150/71     Girls Systolic BP Percentile --      Girls Diastolic BP Percentile --      Boys Systolic BP Percentile --      Boys Diastolic BP Percentile --      Pulse Rate 09/04/24 1017 (!) 46     Resp 09/04/24 1017 18     Temp 09/04/24 1017 97.7 F (36.5 C)     Temp Source 09/04/24 1017 Oral     SpO2 09/04/24 1017 94 %     Weight --      Height --      Head Circumference --      Peak Flow --      Pain Score 09/04/24 1016 0      Pain Loc --      Pain Education --      Exclude from Growth Chart --    No data found.  Updated Vital Signs BP (!) 147/57 (BP Location: Right Arm)   Pulse (!) 46   Temp 97.7 F (36.5 C) (Oral)   Resp 18   SpO2 94%   Visual Acuity Right Eye Distance:   Left Eye Distance:   Bilateral Distance:    Right Eye Near:   Left Eye Near:    Bilateral Near:     Physical Exam Constitutional:      Appearance: Normal appearance.  Cardiovascular:     Rate and Rhythm: Normal rate and regular rhythm.  Pulmonary:     Effort: Pulmonary effort is normal.     Breath sounds: Normal breath sounds.  Musculoskeletal:        General: Normal range of motion.  Neurological:     Mental Status: He is alert.  Psychiatric:        Mood and Affect: Mood normal.      UC Treatments / Results  Labs (all labs ordered are listed, but only abnormal results are displayed) Labs Reviewed - No data to display  EKG   Radiology VAS US  CAROTID Result Date: 09/03/2024 Carotid Arterial Duplex Study Patient Name:  LABRANDON KNOCH  Date of Exam:   09/03/2024 Medical Rec #: [id number]        Accession #:    [id number] Date of Birth: 07-29-1941        Patient Gender: M Patient Age:   87 years Exam Location:  [address] Procedure:      VAS US  CAROTID Referring Phys: FONDA ROBINS --------------------------------------------------------------------------------  Indications:   Bilateral endarterectomies and 05/03/2021: Left carotid                endarterectomy; 03/29/2018 Right carotid endarterectomy. Risk Factors:  Hypertension, hyperlipidemia, past history of smoking, prior MI,                coronary artery disease.  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 Performing Technologist: Stoney Ross RVT  Examination Guidelines: A complete evaluation includes B-mode imaging, spectral Doppler, color Doppler, and power Doppler as needed of all accessible portions of  each vessel. Bilateral testing is considered an integral part of a complete examination. Limited examinations for reoccurring indications may be performed as noted.  Right Carotid Findings: +----------+--------+--------+--------+------------------+--------+           PSV cm/sEDV cm/sStenosisPlaque DescriptionComments +----------+--------+--------+--------+------------------+--------+ CCA Prox  115     10                                         +----------+--------+--------+--------+------------------+--------+ CCA Mid   83      11                                         +----------+--------+--------+--------+------------------+--------+ CCA Distal59      6                                          +----------+--------+--------+--------+------------------+--------+ ICA Prox  66      11      1-39%                              +----------+--------+--------+--------+------------------+--------+ ICA Mid   84      10                                         +----------+--------+--------+--------+------------------+--------+ ICA Distal81      12                                         +----------+--------+--------+--------+------------------+--------+ ECA       64      0                                          +----------+--------+--------+--------+------------------+--------+ +----------+--------+-------+----------------+-------------------+           PSV cm/sEDV cmsDescribe        Arm Pressure (mmHG) +----------+--------+-------+----------------+-------------------+ [id number]     0      Multiphasic, WNL                    +----------+--------+-------+----------------+-------------------+ +---------+--------+---+--------+--+---------+ VertebralPSV cm/s122EDV cm/s13Antegrade +---------+--------+---+--------+--+---------+  Left Carotid Findings: +----------+--------+--------+--------+------------------+--------+           PSV cm/sEDV  cm/sStenosisPlaque DescriptionComments +----------+--------+--------+--------+------------------+--------+ CCA Prox  79      4                                          +----------+--------+--------+--------+------------------+--------+ CCA Mid   112     12                                         +----------+--------+--------+--------+------------------+--------+  CCA Distal98      14                                         +----------+--------+--------+--------+------------------+--------+ ICA Prox  64      17      1-39%                              +----------+--------+--------+--------+------------------+--------+ ICA Mid   82      11                                         +----------+--------+--------+--------+------------------+--------+ ICA Distal81      13                                         +----------+--------+--------+--------+------------------+--------+ ECA       82                                                 +----------+--------+--------+--------+------------------+--------+ +----------+--------+--------+----------------+-------------------+           PSV cm/sEDV cm/sDescribe        Arm Pressure (mmHG) +----------+--------+--------+----------------+-------------------+ [id number]             Multiphasic, WNL                    +----------+--------+--------+----------------+-------------------+ +---------+--------+--------+--------------+ VertebralPSV cm/sEDV cm/sNot identified +---------+--------+--------+--------------+   Summary: Right Carotid: Velocities in the right ICA are consistent with a 1-39% stenosis. Left Carotid: Velocities in the left ICA are consistent with a 1-39% stenosis. Vertebrals:  Right vertebral artery demonstrates antegrade flow. Left vertebral              artery demonstrates no discernable flow. Subclavians: Normal flow hemodynamics were seen in bilateral subclavian              arteries. *See  table(s) above for measurements and observations.  Electronically signed by Penne Colorado MD on 09/03/2024 at 4:41:31 PM.    Final     Procedures Procedures (including critical care time)  Medications Ordered in UC Medications - No data to display  Initial Impression / Assessment and Plan / UC Course  I have reviewed the triage vital signs and the nursing notes.  Pertinent labs & imaging results that were available during my care of the patient were reviewed by me and considered in my medical decision making (see chart for details).     Essential hypertension-patient blood pressure not concerning today with readings at 147/57 and 150/71.  He is not having any concerning signs or symptoms or red flags on exam.  He is bradycardic but this is typical with him taken metoprolol . Recommendations were to continue monitoring blood pressure at home.  If blood pressure increases again we will need to see cardiologist for follow-up and blood pressure management.  If he starts developing any symptoms to include severe dizziness, lightheadedness, weakness, slurred speech, trouble with gait, chest pain or shortness of breath will need to go to the hospital.  Patient  understanding and agreed to plan. Final Clinical Impressions(s) / UC Diagnoses   Final diagnoses:  Essential hypertension     Discharge Instructions      No concerns today her blood pressure was between 140 and 150.  Continue to monitor your blood pressure at home.  If your blood pressure increases again you will need to follow-up with your primary care or your cardiologist. If you start developing symptoms to include severe dizziness, lightheadedness, weakness, slurred speech, trouble with your gait you will need to go to the hospital   ED Prescriptions   None    PDMP not reviewed this encounter.   Adah Wilbert LABOR, FNP 09/04/24 1137

## 2024-09-04 NOTE — ED Triage Notes (Signed)
 Pt reports his cardio and pcp have been trying to adjust his medication for his blood pressure. Pt had stent surgery in March and ever since pt has been having blood pressure issues. Pt is currently taking Metoprolol , Tikosyn  and Losartan .

## 2024-09-04 NOTE — Discharge Instructions (Signed)
 No concerns today her blood pressure was between 140 and 150.  Continue to monitor your blood pressure at home.  If your blood pressure increases again you will need to follow-up with your primary care or your cardiologist. If you start developing symptoms to include severe dizziness, lightheadedness, weakness, slurred speech, trouble with your gait you will need to go to the hospital

## 2024-12-19 ENCOUNTER — Other Ambulatory Visit: Payer: Self-pay | Admitting: General Practice
